# Patient Record
Sex: Male | Born: 1935 | Race: White | Hispanic: No | Marital: Married | Smoking: Former smoker
Health system: Southern US, Community
[De-identification: ages and names within clinical notes are randomized; demographics above are authoritative.]

## PROBLEM LIST (undated history)

## (undated) DIAGNOSIS — M1611 Unilateral primary osteoarthritis, right hip: Secondary | ICD-10-CM

## (undated) DIAGNOSIS — K219 Gastro-esophageal reflux disease without esophagitis: Secondary | ICD-10-CM

## (undated) DIAGNOSIS — K449 Diaphragmatic hernia without obstruction or gangrene: Secondary | ICD-10-CM

## (undated) DIAGNOSIS — R112 Nausea with vomiting, unspecified: Secondary | ICD-10-CM

## (undated) DIAGNOSIS — N3281 Overactive bladder: Secondary | ICD-10-CM

## (undated) DIAGNOSIS — I4891 Unspecified atrial fibrillation: Secondary | ICD-10-CM

## (undated) DIAGNOSIS — K921 Melena: Secondary | ICD-10-CM

## (undated) DIAGNOSIS — T4145XA Adverse effect of unspecified anesthetic, initial encounter: Secondary | ICD-10-CM

## (undated) DIAGNOSIS — M1990 Unspecified osteoarthritis, unspecified site: Secondary | ICD-10-CM

## (undated) DIAGNOSIS — K5790 Diverticulosis of intestine, part unspecified, without perforation or abscess without bleeding: Secondary | ICD-10-CM

## (undated) DIAGNOSIS — T8859XA Other complications of anesthesia, initial encounter: Secondary | ICD-10-CM

## (undated) DIAGNOSIS — K226 Gastro-esophageal laceration-hemorrhage syndrome: Secondary | ICD-10-CM

## (undated) DIAGNOSIS — K222 Esophageal obstruction: Secondary | ICD-10-CM

## (undated) DIAGNOSIS — A5216 Charcot's arthropathy (tabetic): Secondary | ICD-10-CM

## (undated) DIAGNOSIS — E7800 Pure hypercholesterolemia, unspecified: Secondary | ICD-10-CM

## (undated) DIAGNOSIS — Z9889 Other specified postprocedural states: Secondary | ICD-10-CM

## (undated) HISTORY — DX: Esophageal obstruction: K22.2

## (undated) HISTORY — PX: UMBILICAL HERNIA REPAIR: SHX196

## (undated) HISTORY — DX: Gastro-esophageal reflux disease without esophagitis: K21.9

## (undated) HISTORY — DX: Unspecified osteoarthritis, unspecified site: M19.90

## (undated) HISTORY — PX: CHOLECYSTECTOMY: SHX55

## (undated) HISTORY — DX: Gastro-esophageal laceration-hemorrhage syndrome: K22.6

## (undated) HISTORY — DX: Diverticulosis of intestine, part unspecified, without perforation or abscess without bleeding: K57.90

## (undated) HISTORY — DX: Diaphragmatic hernia without obstruction or gangrene: K44.9

## (undated) HISTORY — PX: UPPER GASTROINTESTINAL ENDOSCOPY: SHX188

## (undated) HISTORY — DX: Unspecified atrial fibrillation: I48.91

## (undated) HISTORY — PX: COLONOSCOPY: SHX174

---
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
## 1979-01-05 DIAGNOSIS — K226 Gastro-esophageal laceration-hemorrhage syndrome: Secondary | ICD-10-CM

## 1979-01-05 HISTORY — DX: Gastro-esophageal laceration-hemorrhage syndrome: K22.6

## 1979-10-30 ENCOUNTER — Encounter: Payer: Self-pay | Admitting: Gastroenterology

## 1979-11-02 ENCOUNTER — Encounter: Payer: Self-pay | Admitting: Gastroenterology

## 1988-01-05 HISTORY — PX: HEMORRHOID SURGERY: SHX153

## 1988-01-16 ENCOUNTER — Encounter: Payer: Self-pay | Admitting: Gastroenterology

## 1992-03-27 ENCOUNTER — Encounter: Payer: Self-pay | Admitting: Gastroenterology

## 1992-04-11 ENCOUNTER — Encounter: Payer: Self-pay | Admitting: Gastroenterology

## 1998-08-14 ENCOUNTER — Encounter: Payer: Self-pay | Admitting: Gastroenterology

## 2003-12-23 ENCOUNTER — Ambulatory Visit: Payer: Self-pay | Admitting: Gastroenterology

## 2004-01-09 ENCOUNTER — Ambulatory Visit: Payer: Self-pay | Admitting: Gastroenterology

## 2004-01-09 ENCOUNTER — Encounter (INDEPENDENT_AMBULATORY_CARE_PROVIDER_SITE_OTHER): Payer: Self-pay | Admitting: *Deleted

## 2008-12-18 ENCOUNTER — Encounter (INDEPENDENT_AMBULATORY_CARE_PROVIDER_SITE_OTHER): Payer: Self-pay | Admitting: *Deleted

## 2009-01-29 ENCOUNTER — Encounter (INDEPENDENT_AMBULATORY_CARE_PROVIDER_SITE_OTHER): Payer: Self-pay | Admitting: *Deleted

## 2009-02-21 ENCOUNTER — Encounter (INDEPENDENT_AMBULATORY_CARE_PROVIDER_SITE_OTHER): Payer: Self-pay | Admitting: *Deleted

## 2009-02-24 ENCOUNTER — Ambulatory Visit: Payer: Self-pay | Admitting: Gastroenterology

## 2009-03-11 ENCOUNTER — Ambulatory Visit: Payer: Self-pay | Admitting: Gastroenterology

## 2009-05-28 ENCOUNTER — Encounter: Admission: RE | Admit: 2009-05-28 | Discharge: 2009-05-28 | Payer: Self-pay | Admitting: Endocrinology

## 2010-02-05 NOTE — Letter (Signed)
Summary: [facility] Instructions  [facility]  [address] [place], [place] [address]   Phone: [phone]  Fax: [phone]       Phillip Shelton    January 10, 1934    MRN: [id number]        Procedure Day Dorna Bloom:  Jake Shark  03/11/09     Arrival Time:  7:30AM     Procedure Time:  8:30AM     Location of Procedure:                    _ X_   Endoscopy Center (4th Floor)                        PREPARATION FOR COLONOSCOPY WITH MOVIPREP   Starting 5 days prior to your procedure 03/06/09 do not eat nuts, seeds, popcorn, corn, beans, peas,  salads, or any raw vegetables.  Do not take any fiber supplements (e.g. Metamucil, Citrucel, and Benefiber).  THE DAY BEFORE YOUR PROCEDURE         DATE: 03/10/09  DAY: MONDAY  1.  Drink clear liquids the entire day-NO SOLID FOOD  2.  Do not drink anything colored red or purple.  Avoid juices with pulp.  No orange juice.  3.  Drink at least 64 oz. (8 glasses) of fluid/clear liquids during the day to prevent dehydration and help the prep work efficiently.  CLEAR LIQUIDS INCLUDE: Water Jello Ice Popsicles Tea (sugar ok, no milk/cream) Powdered fruit flavored drinks Coffee (sugar ok, no milk/cream) Gatorade Juice: apple, white grape, white cranberry  Lemonade Clear bullion, consomm, broth Carbonated beverages (any kind) Strained chicken noodle soup Hard Candy                             4.  In the morning, mix first dose of MoviPrep solution:    Empty 1 Pouch A and 1 Pouch B into the disposable container    Add lukewarm drinking water to the top line of the container. Mix to dissolve    Refrigerate (mixed solution should be used within 24 hrs)  5.  Begin drinking the prep at 5:00 p.m. The MoviPrep container is divided by 4 marks.   Every 15 minutes drink the solution down to the next mark (approximately 8 oz) until the full liter is complete.   6.  Follow completed prep with 16 oz of clear liquid of your choice (Nothing  red or purple).  Continue to drink clear liquids until bedtime.  7.  Before going to bed, mix second dose of MoviPrep solution:    Empty 1 Pouch A and 1 Pouch B into the disposable container    Add lukewarm drinking water to the top line of the container. Mix to dissolve    Refrigerate  THE DAY OF YOUR PROCEDURE      DATE: 03/11/09  DAY: TUESDAY  Beginning at 3:30AM (5 hours before procedure):         1. Every 15 minutes, drink the solution down to the next mark (approx 8 oz) until the full liter is complete.  2. Follow completed prep with 16 oz. of clear liquid of your choice.    3. You may drink clear liquids until 6:30AM (2 HOURS BEFORE PROCEDURE).   MEDICATION INSTRUCTIONS  Unless otherwise instructed, you should take regular prescription medications with a small sip of water   as early as possible the morning  of your procedure.      Additional medication instructions: n/a         OTHER INSTRUCTIONS  You will need a responsible adult at least 75 years of age to accompany you and drive you home.   This person must remain in the waiting room during your procedure.  Wear loose fitting clothing that is easily removed.  Leave jewelry and other valuables at home.  However, you may wish to bring a book to read or  an iPod/MP3 player to listen to music as you wait for your procedure to start.  Remove all body piercing jewelry and leave at home.  Total time from sign-in until discharge is approximately 2-3 hours.  You should go home directly after your procedure and rest.  You can resume normal activities the  day after your procedure.  The day of your procedure you should not:   Drive   Make legal decisions   Operate machinery   Drink alcohol   Return to work  You will receive specific instructions about eating, activities and medications before you leave.    The above instructions have been reviewed and explained to me by   Sherren Kerns RN  February 24, 2009 1:03 PM.    I fully understand and can verbalize these instructions _____________________________ Date _________

## 2010-02-05 NOTE — Letter (Signed)
Summary: Previsit letter  [facility]  [address] [place], [place] [address]   Phone: [phone]  Fax: [phone]       01/29/2009 MRN: [id number]  Phillip Shelton [address] [place], [place]  [address]  Dear [name],  Welcome to the Gastroenterology Division at [facility].    You are scheduled to see a nurse for your pre-procedure visit on 02/24/2009 at 1:00PM on the 3rd floor at [facility], [address]  We ask that you try to arrive at our office 15 minutes prior to your appointment time to allow for check-in.  Your nurse visit will consist of discussing your medical and surgical history, your immediate family medical history, and your medications.    Please bring a complete list of all your medications or, if you prefer, bring the medication bottles and we will list them.  We will need to be aware of both prescribed and over the counter drugs.  We will need to know exact dosage information as well.  If you are on blood thinners (Coumadin, Plavix, Aggrenox, Ticlid, etc.) please call our office today/prior to your appointment, as we need to consult with your physician about holding your medication.   Please be prepared to read and sign documents such as consent forms, a financial agreement, and acknowledgement forms.  If necessary, and with your consent, a friend or relative is welcome to sit-in on the nurse visit with you.  Please bring your insurance card so that we may make a copy of it.  If your insurance requires a referral to see a specialist, please bring your referral form from your primary care physician.  No co-pay is required for this nurse visit.     If you cannot keep your appointment, please call [phone] to cancel or reschedule prior to your appointment date.  This allows [place] the opportunity to schedule an appointment for another patient in need of care.    Thank you for choosing [facility] for your medical  needs.  We appreciate the opportunity to care for you.  Please visit [place] at our website  to learn more about our practice.                     Sincerely.                                                                                                                   The Gastroenterology Division

## 2010-02-05 NOTE — Miscellaneous (Signed)
Summary: previsit/rm  Clinical Lists Changes  Medications: Added new medication of MOVIPREP 100 GM  SOLR (PEG-KCL-NACL-NASULF-NA ASC-C) As per prep instructions. - Signed Rx of MOVIPREP 100 GM  SOLR (PEG-KCL-NACL-NASULF-NA ASC-C) As per prep instructions.;  #1 x 0;  Signed;  Entered by: Sherren Kerns RN;  Authorized by: Meryl Dare MD [facility];  Method used: [facility]  [address] [id number] [address] [place], [place]  [address], Ph: [phone] or [phone], Fax: [phone] Observations: Added new observation of ALLERGY REV: Done (02/24/2009 12:37) Added new observation of NKA: T (02/24/2009 12:37)    Prescriptions: MOVIPREP 100 GM  SOLR (PEG-KCL-NACL-NASULF-NA ASC-C) As per prep instructions.  #1 x 0   Entered by:   Sherren Kerns RN   Authorized by:   Meryl Dare MD [facility]   Signed by:   Sherren Kerns RN on 02/24/2009   Method used:   [facility]  [address] [id number]       [address]       [place], [place]  [address]       Ph: [phone] or [phone]       Fax: [phone]   RxID:   [id number]

## 2010-02-05 NOTE — Procedures (Signed)
Summary: Colonoscopy  Patient: Phillip Shelton Note: All result statuses are Final unless otherwise noted.  Tests: (1) Colonoscopy (COL)   COL Colonoscopy           DONE     [facility]     [address]     [place], [place]  [address]           COLONOSCOPY PROCEDURE REPORT           PATIENT:  Phillip Shelton, Phillip Shelton  MR#:  [id number]     BIRTHDATE:  08-27-1934, 74 yrs. old  GENDER:  male           ENDOSCOPIST:  Judie Petit T. Russella Dar, MD, [facility]           PROCEDURE DATE:  03/11/2009     PROCEDURE:  Colonoscopy 132[address]     ASA CLASS:  Class II     INDICATIONS:  1) Elevated Risk Screening  2) family history of     colon cancer `           MEDICATIONS:   Fentanyl 75 mcg IV, Versed 7 mg IV           DESCRIPTION OF PROCEDURE:   After the risks benefits and     alternatives of the procedure were thoroughly explained, informed     consent was obtained.  Digital rectal exam was performed and     revealed no abnormalities.   The LB [id number] endoscope     was introduced through the anus and advanced to the cecum, which     was identified by both the appendix and ileocecal valve, without     limitations.  The quality of the prep was excellent, using     MoviPrep.  The instrument was then slowly withdrawn as the colon     was fully examined.     <<PROCEDUREIMAGES>>           FINDINGS:  Mild diverticulosis was found in the sigmoid colon.  A     normal appearing cecum, ileocecal valve, and appendiceal orifice     were identified. The ascending, hepatic flexure, transverse,     splenic flexure, descending, and rectum appeared unremarkable.     Retroflexed views in the rectum revealed internal hemorrhoids.,     small.  The time to cecum =  3.25  minutes. The scope was then     withdrawn (time =  10  min) from the patient and the procedure     completed.           COMPLICATIONS:  None           ENDOSCOPIC IMPRESSION:     1) Mild diverticulosis     2) Internal hemorrhoids        RECOMMENDATIONS:     1) High fiber diet with liberal fluid intake.     2) Repeat Colonoscopy in 5 years.           Venita Lick. Russella Dar, MD, [facility]           CC: Reather Littler, MD           n.     Rosalie DoctorVenita Lick. Montrelle Eddings at 03/11/2009 09:04 AM           Marden Noble, [phone]  Note: An exclamation mark (!) indicates a result that was not dispersed into the flowsheet. Document Creation Date: 03/11/2009 9:04 AM _______________________________________________________________________  (1) Order result status: Final Collection or  observation date-time: 03/11/2009 08:59 Requested date-time:  Receipt date-time:  Reported date-time:  Referring Physician:   Ordering Physician: Claudette Head [phone]) Specimen Source:  Source: Launa Grill Order Number: [id number] Lab site:   Appended Document: Colonoscopy    Clinical Lists Changes  Observations: Added new observation of COLONNXTDUE: 03/2014 (03/11/2009 12:03)

## 2010-02-10 ENCOUNTER — Telehealth: Payer: Self-pay | Admitting: Gastroenterology

## 2010-02-12 ENCOUNTER — Encounter: Payer: Self-pay | Admitting: Gastroenterology

## 2010-02-12 ENCOUNTER — Ambulatory Visit (INDEPENDENT_AMBULATORY_CARE_PROVIDER_SITE_OTHER): Payer: MEDICARE | Admitting: Gastroenterology

## 2010-02-12 DIAGNOSIS — K921 Melena: Secondary | ICD-10-CM

## 2010-02-12 DIAGNOSIS — K219 Gastro-esophageal reflux disease without esophagitis: Secondary | ICD-10-CM

## 2010-02-12 DIAGNOSIS — K920 Hematemesis: Secondary | ICD-10-CM

## 2010-02-12 HISTORY — DX: Melena: K92.1

## 2010-02-12 HISTORY — DX: Gastro-esophageal reflux disease without esophagitis: K21.9

## 2010-02-16 ENCOUNTER — Other Ambulatory Visit: Payer: Self-pay | Admitting: Gastroenterology

## 2010-02-16 ENCOUNTER — Other Ambulatory Visit (AMBULATORY_SURGERY_CENTER): Payer: MEDICARE | Admitting: Gastroenterology

## 2010-02-16 DIAGNOSIS — K209 Esophagitis, unspecified: Secondary | ICD-10-CM

## 2010-02-16 DIAGNOSIS — K219 Gastro-esophageal reflux disease without esophagitis: Secondary | ICD-10-CM

## 2010-02-16 DIAGNOSIS — K222 Esophageal obstruction: Secondary | ICD-10-CM

## 2010-02-16 DIAGNOSIS — K920 Hematemesis: Secondary | ICD-10-CM

## 2010-02-19 ENCOUNTER — Encounter: Payer: Self-pay | Admitting: Gastroenterology

## 2010-02-19 NOTE — Letter (Signed)
Summary: Phillip Shelton   Imported By: Sherian Rein 02/12/2010 14:01:38  _____________________________________________________________________  External Attachment:    Type:   Image     Comment:   External Document

## 2010-02-19 NOTE — Procedures (Signed)
Summary: Colonoscopy   Colonoscopy  Procedure date:  01/09/2004  Findings:      Results: Hemorrhoids.     Results: Diverticulosis.       Location:  [facility].    Procedures Next Due Date:    Colonoscopy: 01/2009  Colonoscopy  Procedure date:  01/09/2004  Findings:      Results: Hemorrhoids.     Results: Diverticulosis.       Location:  [facility].    Procedures Next Due Date:    Colonoscopy: 01/2009 Patient Name: Aws, Shere MRN:  Procedure Procedures: Colonoscopy CPT: 16109.  Personnel: Endoscopist: Ulyess Mort, MD.  Exam Location: Exam performed in Outpatient Clinic. Outpatient  Patient Consent: Procedure, Alternatives, Risks and Benefits discussed, consent obtained, from patient. Consent was obtained by the RN.  Indications  Increased Risk Screening: For family history of colorectal neoplasia, in   History  Current Medications: Patient is not currently taking Coumadin.  Pre-Exam Physical: Entire physical exam was normal.  Exam Exam: Extent of exam reached: Cecum, extent intended: Cecum.  The cecum was identified by appendiceal orifice and IC valve. Colon retroflexion performed. Images were not taken. ASA Classification: II. Tolerance: good.  Monitoring: Pulse and BP monitoring, Oximetry used. Supplemental O2 given.  Colon Prep Prep results: good.  Sedation Meds: Patient assessed and found to be appropriate for moderate (conscious) sedation. Fentanyl 125 mcg. given IV. Versed 12 given IV.  Findings - DIVERTICULOSIS: Splenic Flexure to Sigmoid Colon. ICD9: Diverticulosis: 562.10. Comments: minnimal.  - HEMORRHOIDS: Internal and External. Size: Grade I. ICD9: Hemorrhoids, Internal and  External: 455.6.   Assessment Abnormal examination, see findings above.  Diagnoses: 562.10: Diverticulosis.  455.6: Hemorrhoids, Internal and  External.   Events  Unplanned Interventions: No intervention was required.    Unplanned Events: There were no complications. Plans Medication Plan: Continue current medications.  Patient Education: Patient given standard instructions for: Diverticulosis. Hemorrhoids. Yearly hemoccult testing recommended. Patient instructed to get routine colonoscopy every 5 years.  Disposition: After procedure patient sent to recovery. After recovery patient sent home.  This report was created from the original endoscopy report, which was reviewed and signed by the above listed endoscopist.    cc: Emiliano Dyer, MD

## 2010-02-19 NOTE — Procedures (Signed)
Summary: [facility]   Imported By: Sherian Rein 02/12/2010 14:16:33  _____________________________________________________________________  External Attachment:    Type:   Image     Comment:   External Document

## 2010-02-19 NOTE — Procedures (Signed)
Summary: [facility]   Imported By: Sherian Rein 02/12/2010 14:15:22  _____________________________________________________________________  External Attachment:    Type:   Image     Comment:   External Document

## 2010-02-19 NOTE — Letter (Signed)
Summary: EGD Instructions  [facility]  [address] [place], [place] [address]   Phone: [phone]  Fax: [phone]       Phillip Shelton    10-30-1934    MRN: [id number]       Procedure Day /Date: Monday February 13th, 2012     Arrival Time:  9:00am     Procedure Time: 10:00am     Location of Procedure:                    _ x _ [facility] (4th Floor)    PREPARATION FOR ENDOSCOPY   On 02/16/10 THE DAY OF THE PROCEDURE:  1.   No solid foods, milk or milk products are allowed after midnight the night before your procedure.  2.   Do not drink anything colored red or purple.  Avoid juices with pulp.  No orange juice.  3.  You may drink clear liquids until 8:00am, which is 2 hours before your procedure.                                                                                                CLEAR LIQUIDS INCLUDE: Water Jello Ice Popsicles Tea (sugar ok, no milk/cream) Powdered fruit flavored drinks Coffee (sugar ok, no milk/cream) Gatorade Juice: apple, white grape, white cranberry  Lemonade Clear bullion, consomm, broth Carbonated beverages (any kind) Strained chicken noodle soup Hard Candy   MEDICATION INSTRUCTIONS  Unless otherwise instructed, you should take regular prescription medications with a small sip of water as early as possible the morning of your procedure.        OTHER INSTRUCTIONS  You will need a responsible adult at least 74 years of age to accompany you and drive you home.   This person must remain in the waiting room during your procedure.  Wear loose fitting clothing that is easily removed.  Leave jewelry and other valuables at home.  However, you may wish to bring a book to read or an iPod/MP3 player to listen to music as you wait for your procedure to start.  Remove all body piercing jewelry and leave at home.  Total time from sign-in until discharge is approximately 2-3 hours.  You should go home  directly after your procedure and rest.  You can resume normal activities the day after your procedure.  The day of your procedure you should not:   Drive   Make legal decisions   Operate machinery   Drink alcohol   Return to work  You will receive specific instructions about eating, activities and medications before you leave.    The above instructions have been reviewed and explained to me by   Marchelle Folks.     I fully understand and can verbalize these instructions _____________________________ Date _________

## 2010-02-19 NOTE — Procedures (Signed)
Summary: Panendoscopy/[place]  Panendoscopy/[place]   Imported By: Sherian Rein 02/12/2010 14:03:47  _____________________________________________________________________  External Attachment:    Type:   Image     Comment:   External Document

## 2010-02-19 NOTE — Progress Notes (Signed)
Summary: Triage  Phone Note Call from Patient Call back at Home Phone [phone]   Caller: Patient Call For: Dr. Russella Dar Reason for Call: Talk to Nurse Summary of Call: Pt needs to speak with nurse about his acid reflux Initial call taken by: [place] Johnson,  February 10, 2010 1:40 PM  Follow-up for Phone Call        patient having refuls.  he will come in adn see Dr Russella Dar on Thursday at 10:15 Follow-up by: Darcey Nora RN, CGRN,  February 10, 2010 4:43 PM

## 2010-02-19 NOTE — Assessment & Plan Note (Signed)
Summary: GERD/hematemesis/sheri   History of Present Illness Visit Type: Follow-up Visit Primary GI MD: Elie Goody MD [facility] Primary Provider: Reather Littler, MD Requesting Provider: n/a Chief Complaint: Patient c/o several years "sour feeling" and burning in throat especially after eating spicy foods. Patient notes 2 recent episodes of blood in saliva but denies actually vomiting blood. History of Present Illness:   This is a 76 year old male who relates worsening problems with nighttime reflux and regurgitation. This has led to vomiting on 2 occasions in the past month. He had a small amount of hematemesis with one episode. Following that, he began taking omeprazole on a daily basis instead of as needed and his symptoms have all improved. He underwent upper endoscopy in 1981, showing a Mallory-Weiss tear, chronic reflux changes, peptic stricture, hiatal hernia.   GI Review of Systems    Reports acid reflux and  heartburn.      Denies abdominal pain, belching, bloating, chest pain, dysphagia with liquids, dysphagia with solids, loss of appetite, nausea, vomiting, vomiting blood, weight loss, and  weight gain.      Reports diverticulosis.     Denies anal fissure, black tarry stools, change in bowel habit, constipation, diarrhea, fecal incontinence, heme positive stool, hemorrhoids, irritable bowel syndrome, jaundice, light color stool, liver problems, rectal bleeding, and  rectal pain. Preventive Screening-Counseling & Management  Alcohol-Tobacco     Smoking Status: quit  Caffeine-Diet-Exercise     Does Patient Exercise: yes   Current Medications (verified): 1)  Omeprazole 20 Mg Cpdr (Omeprazole) .... Take 1 Tablet By Mouth Once A Day (Patient Takes Only As Needed) 2)  Mobic 7.5 Mg Tabs (Meloxicam) .... Take 1 Tablet By Mouth Once Daily  Allergies (verified): No Known Drug Allergies  Past History:  Past Medical History: Diverticulosis Hemorrhoids GERD Mallory-Weiss tear  1981 Arthritis  Past Surgical History: Cholecystectomy Hemorrhoidectomy 1990 Umbilical hernia repair  Family History: FH of Colon Cancer: Grandmother  Social History: Married Alcohol Use - yes-2 beers daily Illicit Drug Use - no Patient is a former smoker. -stopped 30 years ago Patient gets regular exercise. Smoking Status:  quit Does Patient Exercise:  yes  Review of Systems       The pertinent positives and negatives are noted as above and in the HPI. All other ROS were reviewed and were negative.   Vital Signs:  Patient profile:   76 year old male Height:      74 inches Weight:      218 pounds BMI:     28.09 BSA:     2.26 Pulse rate:   72 / minute Pulse rhythm:   regular BP sitting:   132 / 80  (left arm)  Vitals Entered By: Lamona Curl CMA (AAMA) (February 12, 2010 10:18 AM)  Physical Exam  General:  Well developed, well nourished, no acute distress. Head:  Normocephalic and atraumatic. Eyes:  PERRLA, no icterus. Mouth:  No deformity or lesions, dentition normal. Lungs:  Clear throughout to auscultation. Heart:  Regular rate and rhythm; no murmurs, rubs,  or bruits. Abdomen:  Soft, nontender and nondistended. No masses, hepatosplenomegaly or hernias noted. Normal bowel sounds. Psych:  Alert and cooperative. Normal mood and affect.  Impression & Recommendations:  Problem # 1:  GERD (ICD-530.81) Worsening reflux symptoms with significant nocturnal symptoms. Regular Mobic usage. Small amount of hematemesis. Rule out esophagitis, ulcer disease an upper GI tract neoplasms. The risks, benefits and alternatives to endoscopy with possible biopsy and possible dilation were discussed  with the patient and they consent to proceed. The procedure will be scheduled electively. Orders: EGD (EGD)  Problem # 2:  HEMATEMESIS (ICD-578.0) See above.  Problem # 3:  FAMILY HX COLON CANCER (ICD-V16.0) Screening colonoscopy recommended March 2016.  Patient  Instructions: 1)  Upper Endoscopy brochure given.  2)  Omeprazole has been sent to your pharmacy for you to take one tablet by mouth once daily. 3)  Avoid foods high in acid content ( tomatoes, citrus juices, spicy foods) . Avoid eating within 3 to 4 hours of lying down or before exercising. Do not over eat; try smaller more frequent meals. Elevate head of bed four inches when sleeping.  4)  Copy sent to : Reather Littler, MD 5)  The medication list was reviewed and reconciled.  All changed / newly prescribed medications were explained.  A complete medication list was provided to the patient / caregiver.  Prescriptions: OMEPRAZOLE 20 MG CPDR (OMEPRAZOLE) Take 1 tablet by mouth once a day  #30 x 11   Entered by:   Christie Nottingham CMA (AAMA)   Authorized by:   Meryl Dare MD [facility]   Signed by:   Christie Nottingham CMA (AAMA) on 02/12/2010   Method used:   [facility]  [address] [id number]       [address]       [place], [place]  [address]       Ph: [phone] or [phone]       Fax: [phone]   RxID:   [id number]

## 2010-02-19 NOTE — Op Note (Signed)
Summary: Anoscopy & Hemorrhoidectomy/[place]  Anoscopy & Hemorrhoidectomy/[place]   Imported By: Sherian Rein 02/12/2010 14:07:52  _____________________________________________________________________  External Attachment:    Type:   Image     Comment:   External Document

## 2010-02-19 NOTE — Letter (Signed)
Summary: Phillip Shelton   Imported By: Sherian Rein 02/12/2010 14:14:10  _____________________________________________________________________  External Attachment:    Type:   Image     Comment:   External Document

## 2010-02-25 NOTE — Letter (Signed)
Summary: Patient [facility] Biopsy Results  [facility]  [address] [place], [place] [address]   Phone: [phone]  Fax: [phone]        February 19, 2010 MRN: [id number]    MYCAH FORMICA [address] [place], [place]  [address]    Dear Mr. Lauricella,  I am pleased to inform you that the biopsies taken during your recent endoscopic examination did not show any evidence of cancer upon pathologic examination. The biopsies showed moderate chronic inflammation.  Continue with the treatment plan as outlined on the day of your      exam.  Please call us if you are having persistent problems or have questions about your condition that have not been fully answered at this time.  Sincerely,  Meryl Dare MD [facility]  This letter has been electronically signed by your physician.  Appended Document: Patient Notice-Endo Biopsy Results letter mailed

## 2010-02-25 NOTE — Procedures (Signed)
Summary: Upper Endoscopy  Patient: Phillip Shelton Note: All result statuses are Final unless otherwise noted.  Tests: (1) Upper Endoscopy (EGD)   EGD Upper Endoscopy       DONE     [facility]     [address]     [place], [place]  [address]           ENDOSCOPY PROCEDURE REPORT     PATIENT:  Rodrigo, Mcgranahan  MR#:  [id number]     BIRTHDATE:  1934/06/07, 75 yrs. old  GENDER:  male     ENDOSCOPIST:  Judie Petit T. Russella Dar, MD, [facility]           PROCEDURE DATE:  02/16/2010     PROCEDURE:  EGD with biopsy, [address]     ASA CLASS:  Class II     INDICATIONS:  hematemesis, GERD     MEDICATIONS:  Fentanyl 50 mcg IV, Versed 5 mg IV     TOPICAL ANESTHETIC:  Exactacain Spray     DESCRIPTION OF PROCEDURE:   After the risks benefits and     alternatives of the procedure were thoroughly explained, informed     consent was obtained.  The LB GIF-H180 [id number] endoscope was     introduced through the mouth and advanced to the second portion of     the duodenum, without limitations.  The instrument was slowly     withdrawn as the mucosa was fully examined.     <<PROCEDUREIMAGES>>     A stricture was found at the gastroesophageal junction. It was     circumferential and benign appearing. Multiple biopsies were     obtained and sent to pathology.  The stomach appeared normal. The     duodenum appeared normal.  Retroflexed views revealed a hiatal     hernia, small.  The scope was then withdrawn from the patient and     the procedure completed.           COMPLICATIONS:  None           ENDOSCOPIC IMPRESSION:     1) Stricture at EGJ     2) Small hiatal hernia           RECOMMENDATIONS:     1) Anti-reflux regimen     2) Await pathology results     3) continue PPI           Malcolm T. Russella Dar, MD, [facility]           CC:  Reather Littler, MD           n.     Rosalie DoctorVenita Lick. Stark at 02/16/2010 09:49 AM           Marden Noble, [id number]  Note: An exclamation mark (!) indicates a result that was  not dispersed into the flowsheet. Document Creation Date: 02/16/2010 9:50 AM _______________________________________________________________________  (1) Order result status: Final Collection or observation date-time: 02/16/2010 09:44 Requested date-time:  Receipt date-time:  Reported date-time:  Referring Physician:   Ordering Physician: Claudette Head [phone]) Specimen Source:  Source: Launa Grill Order Number: [id number] Lab site:

## 2010-08-26 ENCOUNTER — Ambulatory Visit (INDEPENDENT_AMBULATORY_CARE_PROVIDER_SITE_OTHER): Payer: Medicare Other | Admitting: Gastroenterology

## 2010-08-26 ENCOUNTER — Encounter: Payer: Self-pay | Admitting: Gastroenterology

## 2010-08-26 VITALS — BP 140/76 | HR 60 | Ht 73.0 in | Wt 203.0 lb

## 2010-08-26 DIAGNOSIS — K921 Melena: Secondary | ICD-10-CM

## 2010-08-26 DIAGNOSIS — K649 Unspecified hemorrhoids: Secondary | ICD-10-CM

## 2010-08-26 DIAGNOSIS — K219 Gastro-esophageal reflux disease without esophagitis: Secondary | ICD-10-CM

## 2010-08-26 NOTE — Patient Instructions (Signed)
Rectal care and hemorrhoid information given.  cc:  Reather Littler, MD

## 2010-08-26 NOTE — Progress Notes (Signed)
History of Present Illness: This is a 76 year old male who has had intermittent small volume hematochezia described as small amounts of blood on the tissue paper. He uses Preparation H suppositories for a few days and this alleviates his symptoms. The symptoms occur about every 3-6 months. He underwent colonoscopy in March 2011 showing only internal hemorrhoids.  Reflux is well controlled on daily omeprazole.  Denies weight loss, abdominal pain, constipation, diarrhea, change in stool caliber, melena, nausea, vomiting, dysphagia, reflux symptoms, chest pain.  Current Medications, Allergies, Past Medical History, Past Surgical History, Family History and Social History were reviewed in [employer] record.  Physical Exam: General: Well developed , well nourished, no acute distress Head: Normocephalic and atraumatic Eyes:  sclerae anicteric, EOMI Ears: Normal auditory acuity Mouth: No deformity or lesions Lungs: Clear throughout to auscultation Heart: Regular rate and rhythm; no murmurs, rubs or bruits Abdomen: Soft, non tender and non distended. No masses, hepatosplenomegaly or hernias noted. Normal Bowel sounds Rectal: No lesions Hemoccult negative brown stool in the vault Musculoskeletal: Symmetrical with no gross deformities  Pulses:  Normal pulses noted Extremities: No clubbing, cyanosis, edema or deformities noted Neurological: Alert oriented x 4, grossly nonfocal Psychological:  Alert and cooperative. Normal mood and affect  Assessment and Recommendations:  1. Small volume hematochezia secondary to internal hemorrhoids. Standard rectal care instructions. Preparation H suppositories daily as needed. If his symptoms worsen in severity or frequency consider further evaluation and hemorrhoidal management.  2. Family history of colon cancer. Screening colonoscopy recommended March 2016.  3. GERD with a history of a peptic stricture. Maintain standard and flux  measures and omeprazole 20 mg daily long-term.

## 2011-01-29 ENCOUNTER — Encounter (HOSPITAL_BASED_OUTPATIENT_CLINIC_OR_DEPARTMENT_OTHER): Payer: Self-pay | Admitting: *Deleted

## 2011-01-29 NOTE — Progress Notes (Signed)
Had cxr-ekg at dr Coralie Common call No ht or resp problems No labs needed

## 2011-02-02 ENCOUNTER — Other Ambulatory Visit: Payer: Self-pay | Admitting: Physician Assistant

## 2011-02-03 ENCOUNTER — Encounter (HOSPITAL_BASED_OUTPATIENT_CLINIC_OR_DEPARTMENT_OTHER): Payer: Self-pay | Admitting: Certified Registered Nurse Anesthetist

## 2011-02-03 ENCOUNTER — Encounter (HOSPITAL_BASED_OUTPATIENT_CLINIC_OR_DEPARTMENT_OTHER): Payer: Self-pay | Admitting: *Deleted

## 2011-02-03 ENCOUNTER — Encounter (HOSPITAL_BASED_OUTPATIENT_CLINIC_OR_DEPARTMENT_OTHER)
Admission: RE | Disposition: A | Discharge status: Home / Self Care | Payer: Self-pay | Source: Ambulatory Visit | Attending: Orthopedic Surgery

## 2011-02-03 ENCOUNTER — Ambulatory Visit (HOSPITAL_BASED_OUTPATIENT_CLINIC_OR_DEPARTMENT_OTHER): Payer: Medicare Other | Admitting: Certified Registered Nurse Anesthetist

## 2011-02-03 ENCOUNTER — Ambulatory Visit (HOSPITAL_BASED_OUTPATIENT_CLINIC_OR_DEPARTMENT_OTHER)
Admission: RE | Admit: 2011-02-03 | Discharge: 2011-02-03 | Disposition: A | Discharge status: Home / Self Care | Payer: Medicare Other | Source: Ambulatory Visit | Attending: Orthopedic Surgery | Admitting: Orthopedic Surgery

## 2011-02-03 DIAGNOSIS — M6240 Contracture of muscle, unspecified site: Secondary | ICD-10-CM | POA: Insufficient documentation

## 2011-02-03 DIAGNOSIS — M79671 Pain in right foot: Secondary | ICD-10-CM

## 2011-02-03 DIAGNOSIS — G609 Hereditary and idiopathic neuropathy, unspecified: Secondary | ICD-10-CM | POA: Insufficient documentation

## 2011-02-03 DIAGNOSIS — A5211 Tabes dorsalis: Secondary | ICD-10-CM | POA: Insufficient documentation

## 2011-02-03 HISTORY — PX: GASTROCNEMIUS RECESSION: SHX863

## 2011-02-03 SURGERY — RECESSION, MUSCLE, GASTROCNEMIUS
Anesthesia: General | Site: Leg Lower | Laterality: Right | Wound class: Clean

## 2011-02-03 MED ORDER — DEXAMETHASONE SODIUM PHOSPHATE 10 MG/ML IJ SOLN
INTRAMUSCULAR | Status: DC | PRN
  Administered 2011-02-03: 10 mg via INTRAVENOUS

## 2011-02-03 MED ORDER — FENTANYL CITRATE 0.05 MG/ML IJ SOLN
INTRAMUSCULAR | Status: DC | PRN
  Administered 2011-02-03: 25 ug via INTRAVENOUS

## 2011-02-03 MED ORDER — CHLORHEXIDINE GLUCONATE 4 % EX LIQD: 60.0000 mL | Freq: Once | CUTANEOUS | Status: DC

## 2011-02-03 MED ORDER — BUPIVACAINE HCL (PF) 0.5 % IJ SOLN
INTRAMUSCULAR | Status: DC | PRN
  Administered 2011-02-03: 7 mL

## 2011-02-03 MED ORDER — ONDANSETRON HCL 4 MG PO TABS: 4.0000 mg | ORAL_TABLET | Freq: Four times a day (QID) | ORAL | Status: DC | PRN

## 2011-02-03 MED ORDER — VITAMIN C 500 MG PO TABS: 500.0000 mg | ORAL_TABLET | Freq: Every day | ORAL | Status: AC

## 2011-02-03 MED ORDER — METOCLOPRAMIDE HCL 5 MG PO TABS: 5.0000 mg | ORAL_TABLET | Freq: Three times a day (TID) | ORAL | Status: DC | PRN

## 2011-02-03 MED ORDER — CEFAZOLIN SODIUM 1-5 GM-% IV SOLN
1.0000 g | INTRAVENOUS | Status: DC
  Administered 2011-02-03: 2 g via INTRAVENOUS

## 2011-02-03 MED ORDER — 0.9 % SODIUM CHLORIDE (POUR BTL) OPTIME
TOPICAL | Status: DC | PRN
  Administered 2011-02-03: 100 mL

## 2011-02-03 MED ORDER — MIDAZOLAM HCL 2 MG/2ML IJ SOLN
1.0000 mg | INTRAMUSCULAR | Status: DC | PRN
  Administered 2011-02-03: 2 mg via INTRAVENOUS

## 2011-02-03 MED ORDER — BUPIVACAINE-EPINEPHRINE PF 0.5-1:200000 % IJ SOLN
INTRAMUSCULAR | Status: DC | PRN
  Administered 2011-02-03: 30 mL

## 2011-02-03 MED ORDER — ONDANSETRON HCL 4 MG/2ML IJ SOLN
INTRAMUSCULAR | Status: DC | PRN
  Administered 2011-02-03: 4 mg via INTRAVENOUS

## 2011-02-03 MED ORDER — ONDANSETRON HCL 4 MG/2ML IJ SOLN: 4.0000 mg | Freq: Four times a day (QID) | INTRAMUSCULAR | Status: DC | PRN

## 2011-02-03 MED ORDER — FENTANYL CITRATE 0.05 MG/ML IJ SOLN
50.0000 ug | INTRAMUSCULAR | Status: DC | PRN
  Administered 2011-02-03: 100 ug via INTRAVENOUS

## 2011-02-03 MED ORDER — SODIUM CHLORIDE 0.9 % IV SOLN: INTRAVENOUS | Status: DC

## 2011-02-03 MED ORDER — LACTATED RINGERS IV SOLN
INTRAVENOUS | Status: DC
  Administered 2011-02-03 (×2): via INTRAVENOUS

## 2011-02-03 MED ORDER — CEFAZOLIN SODIUM-DEXTROSE 2-3 GM-% IV SOLR: 2.0000 g | INTRAVENOUS | Status: DC

## 2011-02-03 MED ORDER — PROPOFOL 10 MG/ML IV EMUL
INTRAVENOUS | Status: DC | PRN
  Administered 2011-02-03: 200 mg via INTRAVENOUS

## 2011-02-03 MED ORDER — METHOCARBAMOL 500 MG PO TABS: 500.0000 mg | ORAL_TABLET | Freq: Three times a day (TID) | ORAL | Status: AC

## 2011-02-03 MED ORDER — HYDROMORPHONE HCL PF 1 MG/ML IJ SOLN: 0.2500 mg | INTRAMUSCULAR | Status: DC | PRN

## 2011-02-03 MED ORDER — METOCLOPRAMIDE HCL 5 MG/ML IJ SOLN: 5.0000 mg | Freq: Three times a day (TID) | INTRAMUSCULAR | Status: DC | PRN

## 2011-02-03 MED ORDER — LIDOCAINE HCL (CARDIAC) 20 MG/ML IV SOLN
INTRAVENOUS | Status: DC | PRN
  Administered 2011-02-03: 60 mg via INTRAVENOUS

## 2011-02-03 MED ORDER — PROMETHAZINE HCL 25 MG/ML IJ SOLN: 6.2500 mg | INTRAMUSCULAR | Status: DC | PRN

## 2011-02-03 SURGICAL SUPPLY — 60 items
APL SKNCLS STERI-STRIP NONHPOA (GAUZE/BANDAGES/DRESSINGS)
BANDAGE ACE 4 STERILE (GAUZE/BANDAGES/DRESSINGS) ×1 IMPLANT
BANDAGE ELASTIC 4 VELCRO ST LF (GAUZE/BANDAGES/DRESSINGS) ×3 IMPLANT
BANDAGE ELASTIC 6 VELCRO ST LF (GAUZE/BANDAGES/DRESSINGS) IMPLANT
BENZOIN TINCTURE PRP APPL 2/3 (GAUZE/BANDAGES/DRESSINGS) ×1 IMPLANT
BLADE SURG 15 STRL LF DISP TIS (BLADE) ×2 IMPLANT
BLADE SURG 15 STRL SS (BLADE) ×4
BRUSH SCRUB EZ PLAIN DRY (MISCELLANEOUS) ×2 IMPLANT
CAP PIN PROTECTOR ORTHO WHT (CAP) IMPLANT
CLOSURE STERI STRIP 1/2 X4 (GAUZE/BANDAGES/DRESSINGS) ×1 IMPLANT
CLOTH BEACON ORANGE TIMEOUT ST (SAFETY) ×2 IMPLANT
COVER TABLE BACK 60X90 (DRAPES) ×2 IMPLANT
CUFF TOURNIQUET SINGLE 34IN LL (TOURNIQUET CUFF) ×2 IMPLANT
DRAPE EXTREMITY T 121X128X90 (DRAPE) ×2 IMPLANT
DRAPE SURG 17X23 STRL (DRAPES) ×2 IMPLANT
DURA STEPPER LG (CAST SUPPLIES) IMPLANT
DURA STEPPER MED (CAST SUPPLIES) IMPLANT
DURA STEPPER SML (CAST SUPPLIES) IMPLANT
ELECT REM PT RETURN 9FT ADLT (ELECTROSURGICAL)
ELECTRODE REM PT RTRN 9FT ADLT (ELECTROSURGICAL) IMPLANT
GAUZE SPONGE 4X4 16PLY XRAY LF (GAUZE/BANDAGES/DRESSINGS) IMPLANT
GLOVE BIO SURGEON STRL SZ 6.5 (GLOVE) ×1 IMPLANT
GLOVE BIO SURGEON STRL SZ8 (GLOVE) ×2 IMPLANT
GLOVE BIOGEL PI IND STRL 8 (GLOVE) ×2 IMPLANT
GLOVE BIOGEL PI INDICATOR 8 (GLOVE) ×2
GLOVE SKINSENSE NS SZ7.0 (GLOVE) ×1
GLOVE SKINSENSE STRL SZ7.0 (GLOVE) IMPLANT
GLOVE SURG SS PI 8.0 STRL IVOR (GLOVE) ×2 IMPLANT
GOWN BRE IMP PREV XXLGXLNG (GOWN DISPOSABLE) ×2 IMPLANT
GOWN PREVENTION PLUS XLARGE (GOWN DISPOSABLE) ×2 IMPLANT
KWIRE 4.0 X .045IN (WIRE) IMPLANT
NEEDLE HYPO 22GX1.5 SAFETY (NEEDLE) IMPLANT
NS IRRIG 1000ML POUR BTL (IV SOLUTION) ×2 IMPLANT
PACK BASIN DAY SURGERY FS (CUSTOM PROCEDURE TRAY) ×2 IMPLANT
PAD CAST 4YDX4 CTTN HI CHSV (CAST SUPPLIES) ×1 IMPLANT
PADDING CAST ABS 4INX4YD NS (CAST SUPPLIES)
PADDING CAST ABS COTTON 4X4 ST (CAST SUPPLIES) IMPLANT
PADDING CAST COTTON 4X4 STRL (CAST SUPPLIES) ×2
PADDING WEBRIL 4 STERILE (GAUZE/BANDAGES/DRESSINGS) ×1 IMPLANT
PENCIL BUTTON HOLSTER BLD 10FT (ELECTRODE) ×1 IMPLANT
SHEET MEDIUM DRAPE 40X70 STRL (DRAPES) ×4 IMPLANT
SPONGE GAUZE 4X4 12PLY (GAUZE/BANDAGES/DRESSINGS) ×2 IMPLANT
STOCKINETTE 6  STRL (DRAPES) ×1
STOCKINETTE 6 STRL (DRAPES) ×1 IMPLANT
STRIP CLOSURE SKIN 1/2X4 (GAUZE/BANDAGES/DRESSINGS) ×1 IMPLANT
SUCTION FRAZIER TIP 10 FR DISP (SUCTIONS) ×1 IMPLANT
SUT ETHILON 3 0 PS 1 (SUTURE) IMPLANT
SUT ETHILON 4 0 PS 2 18 (SUTURE) IMPLANT
SUT MON AB 4-0 PC3 18 (SUTURE) ×1 IMPLANT
SUT VIC AB 3-0 FS2 27 (SUTURE) ×1 IMPLANT
SUT VIC AB 3-0 PS1 18 (SUTURE)
SUT VIC AB 3-0 PS1 18XBRD (SUTURE) IMPLANT
SYR 20CC LL (SYRINGE) IMPLANT
SYR BULB 3OZ (MISCELLANEOUS) ×2 IMPLANT
SYR CONTROL 10ML LL (SYRINGE) IMPLANT
TOWEL OR 17X24 6PK STRL BLUE (TOWEL DISPOSABLE) ×2 IMPLANT
TOWEL OR NON WOVEN STRL DISP B (DISPOSABLE) ×2 IMPLANT
TUBE CONNECTING 20X1/4 (TUBING) ×1 IMPLANT
UNDERPAD 30X30 INCONTINENT (UNDERPADS AND DIAPERS) ×2 IMPLANT
WATER STERILE IRR 1000ML POUR (IV SOLUTION) ×3 IMPLANT

## 2011-02-03 NOTE — Anesthesia Procedure Notes (Addendum)
Anesthesia Regional Block:  Popliteal block  Pre-Anesthetic Checklist: ,, timeout performed, Correct Patient, Correct Site, Correct Laterality, Correct Procedure, Correct Position, site marked, Risks and benefits discussed,  Surgical consent,  Pre-op evaluation,  At surgeon's request and post-op pain management  Laterality: Right  Prep: chloraprep       Needles:  Injection technique: Single-shot  Needle Type: Stimulator Needle - 80      Needle Gauge: 22 and 22 G    Additional Needles:  Procedures: nerve stimulator Popliteal block  Nerve Stimulator or Paresthesia:  Response: toe dorsiflexion, 0.45 mA, 0.1 ms,   Additional Responses:   Narrative:  Start time: 02/03/2011 9:28 AM End time: 02/03/2011 9:40 AM Injection made incrementally with aspirations every 5 mL.  Performed by: Personally  Anesthesiologist: Sandford Craze, MD  Additional Notes: Pt identified in Holding room.  Monitors applied. Working IV access confirmed. Sterile prep.  #22ga PNS to toe twitch at 0.63mA threshold.  30cc 0.5% Bupivacaine with 1:200k epi injected incrementally after negative test dose.  Additional 7cc 0.5% Bupivacaine injected subq at prox, ant-med tibia for saphenous supplementation. Patient asymptomatic, VSS, no heme aspirated, tolerated well.   Sandford Craze, MD   Procedure Name: LMA Insertion Date/Time: 02/03/2011 9:52 AM Performed by: Cari Burgo D Pre-anesthesia Checklist: Patient identified, Emergency Drugs available, Suction available and Patient being monitored Patient Re-evaluated:Patient Re-evaluated prior to inductionOxygen Delivery Method: Circle System Utilized Preoxygenation: Pre-oxygenation with 100% oxygen Intubation Type: IV induction Ventilation: Mask ventilation without difficulty LMA: LMA inserted LMA Size: 5.0 Number of attempts: 1 Placement Confirmation: positive ETCO2 Tube secured with: Tape Dental Injury: Teeth and Oropharynx as per pre-operative assessment

## 2011-02-03 NOTE — Brief Op Note (Signed)
02/03/2011  10:15 AM  PATIENT:  Phillip Shelton  77 y.o. male  PRE-OPERATIVE DIAGNOSIS:  right tight gastroc  POST-OPERATIVE DIAGNOSIS:  right tight gastroc  PROCEDURE:  Procedure(s): GASTROCNEMIUS SLIDE  SURGEON:  Surgeon(s): Sherri Rad, MD  PHYSICIAN ASSISTANT: Rexene Edison, PAC   ASSISTANTS:  Above  ANESTHESIA:   general  EBL:   Minimal  BLOOD ADMINISTERED:none  DRAINS: none   LOCAL MEDICATIONS USED:  NONE  SPECIMEN:  No Specimen  DISPOSITION OF SPECIMEN:  N/A  COUNTS:  YES  TOURNIQUET:  * No tourniquets in log *  DICTATION: .Other Dictation: Dictation Number [id number]  PLAN OF CARE: Discharge to home after PACU  PATIENT DISPOSITION:  PACU - hemodynamically stable.   Delay start of Pharmacological VTE agent (>24hrs) due to surgical blood loss or risk of bleeding:  {YES/NO/NOT APPLICABLE:20182

## 2011-02-03 NOTE — Op Note (Signed)
NAME:  Phillip Shelton, GRIPP                    ACCOUNT NO.:  MEDICAL RECORD NO.:  [id number]  LOCATION:                                 FACILITY:  PHYSICIAN:  Leonides Grills, M.D.          DATE OF BIRTH:  DATE OF PROCEDURE:  02/03/2011 DATE OF DISCHARGE:                              OPERATIVE REPORT   PREOPERATIVE DIAGNOSIS:  Right tight gastroc.  POSTOPERATIVE DIAGNOSIS:  Right tight gastroc.  OPERATION:  Right gastroc slide.  ANESTHESIA:  General.  SURGEON:  Leonides Grills, M.D.  ASSISTANT:  Richardean Canal, P.A.  ESTIMATED BLOOD LOSS:  Minimal.  TOURNIQUET:  None.  COMPLICATIONS:  None.  DISPOSITION:  Stable to PR.  First assistant was used throughout the case for retraction, aid in visualization, completion of procedure, closure, and dressing application.  INDICATIONS:  This is a 77 year old gentleman who has had Charcot arthropathy of his feet due to severe peripheral neuropathy.  He has impending ulcer with rocker bottom deformity to his foot with a tight gastroc.  He was consented to the above procedure.  All risks, infection, vessel injury, wound healing problems, persistent pain, worse pain, prolonged recovery, still a chance of exacerbation of the Charcot arthropathy, and development of an ulcer, and even amputation down the road were all explained.  Questions were encouraged and answered.  DESCRIPTION OF OPERATION:  The patient was brought to the operating room and placed in supine position.  After adequate general anesthesia was administered as well as Ancef 1 g IV piggyback, the right lower extremity was then prepped and draped in sterile manner and no tourniquet was used.  A longitudinal incision over the medial aspect of the gastrocnemius muscle tendinous junction was then made.  Dissection was carried down through skin.  Hemostasis was obtained.  Fascia was opened in line with the incision.  Conjoined region was then developed between the gastroc and soleus.   Soft tissue was then elevated off posterior aspect of gastrocnemius.  Sural nerve was identified and protected posteriorly throughout the case especially while the gastrocnemius was then released with a curved Mayo scissors.  This had excellent release tight gastroc. The area was copiously irrigated with saline. Subcu was closed 0 Vicryl, skin was closed with 4-0 Monocryl subcuticular stitch.  Steri-Strips were applied.  Sterile dressing was applied.  CAM walker boot was applied.  Total contact Bledsoe boot was applied.  The patient was stable to PR.     Leonides Grills, M.D.     PB/MEDQ  D:  02/03/2011  T:  02/03/2011  Job:  [id number]

## 2011-02-03 NOTE — Anesthesia Postprocedure Evaluation (Signed)
  Anesthesia Post-op Note  Patient: Phillip Shelton  Procedure(s) Performed:  GASTROCNEMIUS SLIDE  Patient Location: PACU  Anesthesia Type: GA combined with regional for post-op pain  Level of Consciousness: awake, alert  and oriented  Airway and Oxygen Therapy: Patient Spontanous Breathing  Post-op Pain: none  Post-op Assessment: Post-op Vital signs reviewed, Patient's Cardiovascular Status Stable, Respiratory Function Stable, Patent Airway, No signs of Nausea or vomiting and Pain level controlled  Post-op Vital Signs: Reviewed and stable  Complications: No apparent anesthesia complications

## 2011-02-03 NOTE — Progress Notes (Signed)
Assisted Dr. Jackson with right, popliteal block. Side rails up, monitors on throughout procedure. See vital signs in flow sheet. Tolerated Procedure well. 

## 2011-02-03 NOTE — H&P (Signed)
  H&P documentation: Placed to be scanned history and physical exam in chart.  -History and Physical Reviewed  -Patient has been re-examined  -No change in the plan of care  Sydell Prowell A  

## 2011-02-03 NOTE — Anesthesia Preprocedure Evaluation (Signed)
Anesthesia Evaluation  Patient identified by MRN, date of birth, ID band Patient awake    Reviewed: Allergy & Precautions, H&P , NPO status , Patient's Chart, lab work & pertinent test results  History of Anesthesia Complications Negative for: history of anesthetic complications  Airway Mallampati: I TM Distance: >3 FB Neck ROM: Full    Dental No notable dental hx. (+) Teeth Intact and Dental Advisory Given   Pulmonary former smoker clear to auscultation  Pulmonary exam normal       Cardiovascular neg cardio ROS Regular Normal    Neuro/Psych Negative Neurological ROS     GI/Hepatic Neg liver ROS, GERD-  Medicated and Controlled,  Endo/Other  Negative Endocrine ROS  Renal/GU negative Renal ROS     Musculoskeletal  (+) Arthritis -,   Abdominal Normal abdominal exam  (+)   Peds  Hematology negative hematology ROS (+)   Anesthesia Other Findings   Reproductive/Obstetrics                           Anesthesia Physical Anesthesia Plan  ASA: II  Anesthesia Plan: General   Post-op Pain Management:    Induction: Intravenous  Airway Management Planned: LMA  Additional Equipment:   Intra-op Plan:   Post-operative Plan:   Informed Consent: I have reviewed the patients History and Physical, chart, labs and discussed the procedure including the risks, benefits and alternatives for the proposed anesthesia with the patient or authorized representative who has indicated his/her understanding and acceptance.   Dental advisory given  Plan Discussed with: CRNA and Surgeon  Anesthesia Plan Comments: (Plan routine monitors, GA- LMA OK, popliteal block for post op analgesia)        Anesthesia Quick Evaluation

## 2011-02-03 NOTE — Transfer of Care (Signed)
Immediate Anesthesia Transfer of Care Note  Patient: Phillip Shelton  Procedure(s) Performed:  GASTROCNEMIUS SLIDE  Patient Location: PACU  Anesthesia Type: GA combined with regional for post-op pain  Level of Consciousness: sedated  Airway & Oxygen Therapy: Patient Spontanous Breathing and Patient connected to face mask oxygen  Post-op Assessment: Report given to PACU RN and Post -op Vital signs reviewed and stable  Post vital signs: Reviewed and stable  Complications: No apparent anesthesia complications

## 2011-02-04 ENCOUNTER — Encounter (HOSPITAL_BASED_OUTPATIENT_CLINIC_OR_DEPARTMENT_OTHER): Payer: Self-pay | Admitting: Orthopedic Surgery

## 2011-02-16 ENCOUNTER — Other Ambulatory Visit: Payer: Self-pay | Admitting: Gastroenterology

## 2011-09-13 ENCOUNTER — Other Ambulatory Visit: Payer: Self-pay | Admitting: Dermatology

## 2012-05-07 ENCOUNTER — Other Ambulatory Visit: Payer: Self-pay | Admitting: Gastroenterology

## 2012-07-14 ENCOUNTER — Other Ambulatory Visit: Payer: Self-pay | Admitting: *Deleted

## 2012-07-14 MED ORDER — TRAMADOL HCL 50 MG PO TABS: 50.0000 mg | ORAL_TABLET | Freq: Four times a day (QID) | ORAL | Status: DC | PRN

## 2012-07-27 ENCOUNTER — Other Ambulatory Visit: Payer: Self-pay | Admitting: Dermatology

## 2012-10-03 ENCOUNTER — Other Ambulatory Visit: Payer: Self-pay | Admitting: *Deleted

## 2012-10-03 MED ORDER — MUPIROCIN 2 % EX OINT: TOPICAL_OINTMENT | Freq: Three times a day (TID) | CUTANEOUS | Status: DC

## 2012-10-13 ENCOUNTER — Ambulatory Visit (INDEPENDENT_AMBULATORY_CARE_PROVIDER_SITE_OTHER): Payer: Medicare Other

## 2012-10-13 DIAGNOSIS — Z23 Encounter for immunization: Secondary | ICD-10-CM

## 2012-10-23 ENCOUNTER — Other Ambulatory Visit: Payer: Self-pay | Admitting: *Deleted

## 2012-10-23 MED ORDER — MELOXICAM 7.5 MG PO TABS: 7.5000 mg | ORAL_TABLET | Freq: Every day | ORAL | Status: DC

## 2012-11-09 ENCOUNTER — Other Ambulatory Visit: Payer: Self-pay | Admitting: *Deleted

## 2012-11-09 ENCOUNTER — Telehealth: Payer: Self-pay | Admitting: *Deleted

## 2012-11-09 MED ORDER — TRAMADOL HCL 50 MG PO TABS: 50.0000 mg | ORAL_TABLET | Freq: Four times a day (QID) | ORAL | Status: DC | PRN

## 2012-11-10 NOTE — Telephone Encounter (Signed)
rx sent

## 2012-12-22 ENCOUNTER — Other Ambulatory Visit (INDEPENDENT_AMBULATORY_CARE_PROVIDER_SITE_OTHER): Payer: Medicare Other

## 2012-12-22 ENCOUNTER — Other Ambulatory Visit: Payer: Self-pay | Admitting: *Deleted

## 2012-12-22 DIAGNOSIS — Z0000 Encounter for general adult medical examination without abnormal findings: Secondary | ICD-10-CM

## 2012-12-22 DIAGNOSIS — E7800 Pure hypercholesterolemia, unspecified: Secondary | ICD-10-CM

## 2012-12-22 LAB — COMPREHENSIVE METABOLIC PANEL
ALT: 14 U/L (ref 0–53)
Albumin: 4.2 g/dL (ref 3.5–5.2)
CO2: 26 mEq/L (ref 19–32)
GFR: 86.09 mL/min (ref >60.00)
Glucose, Bld: 96 mg/dL (ref 70–99)
Potassium: 4.4 mEq/L (ref 3.5–5.1)
Sodium: 141 mEq/L (ref 135–145)
Total Protein: 7.1 g/dL (ref 6.0–8.3)

## 2012-12-22 LAB — CBC
HCT: 41.8 % (ref 39.0–52.0)
MCHC: 34 g/dL (ref 30.0–36.0)
RDW: 13.8 % (ref 11.5–14.6)
WBC: 4.7 10*3/uL (ref 4.5–10.5)

## 2012-12-22 LAB — LIPID PANEL
Cholesterol: 201 mg/dL — ABNORMAL HIGH (ref 0–200)
HDL: 64.3 mg/dL (ref >39.00)
VLDL: 10.8 mg/dL (ref 0.0–40.0)

## 2012-12-22 LAB — URINALYSIS
Ketones, ur: NEGATIVE
Specific Gravity, Urine: 1.025 (ref 1.000–1.030)
Total Protein, Urine: NEGATIVE
Urine Glucose: NEGATIVE
Urobilinogen, UA: 0.2 (ref 0.0–1.0)

## 2012-12-22 LAB — LDL CHOLESTEROL, DIRECT: Direct LDL: 133 mg/dL

## 2012-12-25 ENCOUNTER — Ambulatory Visit: Payer: Medicare Other | Admitting: Endocrinology

## 2012-12-26 ENCOUNTER — Ambulatory Visit (INDEPENDENT_AMBULATORY_CARE_PROVIDER_SITE_OTHER): Payer: Medicare Other | Admitting: Endocrinology

## 2012-12-26 ENCOUNTER — Encounter: Payer: Self-pay | Admitting: Endocrinology

## 2012-12-26 VITALS — BP 130/78 | HR 66 | Temp 98.2°F | Resp 12 | Ht 73.0 in | Wt 202.2 lb

## 2012-12-26 DIAGNOSIS — A5216 Charcot's arthropathy (tabetic): Secondary | ICD-10-CM

## 2012-12-26 DIAGNOSIS — I4510 Unspecified right bundle-branch block: Secondary | ICD-10-CM

## 2012-12-26 DIAGNOSIS — N318 Other neuromuscular dysfunction of bladder: Secondary | ICD-10-CM

## 2012-12-26 DIAGNOSIS — Z0000 Encounter for general adult medical examination without abnormal findings: Secondary | ICD-10-CM

## 2012-12-26 DIAGNOSIS — M549 Dorsalgia, unspecified: Secondary | ICD-10-CM

## 2012-12-26 DIAGNOSIS — M14679 Charcot's joint, unspecified ankle and foot: Secondary | ICD-10-CM

## 2012-12-26 DIAGNOSIS — A5211 Tabes dorsalis: Secondary | ICD-10-CM

## 2012-12-26 DIAGNOSIS — N3281 Overactive bladder: Secondary | ICD-10-CM

## 2012-12-26 DIAGNOSIS — E7800 Pure hypercholesterolemia, unspecified: Secondary | ICD-10-CM

## 2012-12-26 MED ORDER — OMEPRAZOLE 20 MG PO CPDR: DELAYED_RELEASE_CAPSULE | ORAL | Status: DC

## 2012-12-26 MED ORDER — TOLTERODINE TARTRATE ER 2 MG PO CP24: 2.0000 mg | ORAL_CAPSULE | Freq: Every day | ORAL | Status: DC

## 2012-12-26 NOTE — Patient Instructions (Signed)
Take Meloxicam as needed only  Detrol at bedtime for urination

## 2012-12-26 NOTE — Progress Notes (Signed)
Phillip Shelton is a 78 y.o. male.   Chief complaint: Frequent urination  History of Present Illness:  1. He has had problems with frequent urination at night, getting up now every 2 hours. Does not always go back to sleep. Does not have any frequency of urination during the day and also no complaints of hesitancy, slow stream or dribbling. No history of previous prostatitis or UTI. Has been previously tried on Flomax and has seen urologist without benefit 2. Hypercholesterolemia: He has had fairly consistent levels of high cholesterol that the borderline level of about 130. Usually watching his saturated fat in the form of dairy products and fatty meats 3. He is having continued problems with foot deformity and requiring special diabetic shoes for his Charcot foot. However since he has had a tendon release surgery on the right leg he is not having pain. However he continues to take meloxicam on most of the days even though he will not have pain on the days he does not take it 4. Low back pain: He has pain when he is sitting down in the evening reclining on his lower back bilaterally but this is better after he moves around during the day. Does not desire any further treatment 5. History of pre-diabetes since 2009. Blood sugar levels have been minimally increased and had been normal in 2012 and 2013. Generally cutting back on snacks and eating smaller portions. Able to maintain his weight at about 200 6. Gastroesophageal reflux: This is less of a problem and he takes Prilosec only as needed when he has heartburn with certain kind of foods. Previously also possibly had cough from the reflux   PREVENTIVE CARE:  Fall risk screening done, negative Depression screening done, negative Diet: Discussed low saturated fat diet Vitamin D supplementation: Patient using supplement in cod liver oil, however will need to consider further supplementation based on levels Exercise: Patient encouraged to walk as  much as possible, patient's mobility limited by foot pain Patient is compliant with annual eye exams and dentist visits Immunization status: up to date and documented.  Cancer screening Colorectal screening: Stool Hemoccult given today PSA not indicated at his age     Medication List       This list is accurate as of: 12/26/12 10:57 AM.  Always use your most recent med list.               aspirin 81 MG tablet  Take 81 mg by mouth daily.     fish oil-omega-3 fatty acids 1000 MG capsule  Take 2 g by mouth daily.     meloxicam 7.5 MG tablet  Commonly known as:  MOBIC  Take 1 tablet (7.5 mg total) by mouth daily.     mupirocin ointment 2 %  Commonly known as:  BACTROBAN  Apply topically 3 (three) times daily.     neomycin-polymyxin-hydrocortisone 3.5-10000-1 otic suspension  Commonly known as:  CORTISPORIN     omeprazole 20 MG capsule  Commonly known as:  PRILOSEC  TAKE ONE CAPSULE BY MOUTH EVERY DAY     PREPARATION H EX  Apply topically as needed.     traMADol 50 MG tablet  Commonly known as:  ULTRAM  Take 1 tablet (50 mg total) by mouth every 6 (six) hours as needed.        Allergies: No Known Allergies  Past Medical History  Diagnosis Date  . Diverticulosis   . Hemorrhoids   . [place] - [place] tear 1981  .  Arthritis   . Esophageal stricture   . Hiatal hernia   . GERD (gastroesophageal reflux disease)     Past Surgical History  Procedure Laterality Date  . Hemorrhoid surgery  1990  . Umbilical hernia repair    . Cholecystectomy    . Colonoscopy    . Upper gastrointestinal endoscopy    . Gastrocnemius recession  02/03/2011    Procedure: GASTROCNEMIUS SLIDE;  Surgeon: Sherri Rad, MD;  Location: [facility];  Service: Orthopedics;  Laterality: Right;    Family History  Problem Relation Age of Onset  . Colon cancer Maternal Grandmother     Social History:  reports that he quit smoking about 32 years ago. He has never used  smokeless tobacco. He reports that he drinks alcohol. He reports that he does not use illicit drugs.  Review of Systems      No recent weight change.     Eyes: Normal vision, Normal exam yearly.               No unusual headaches.     ENT:  Periodically has ear wax and had this cleaned by ENT surgeon.   Skin: No pigmentation or rash.        Thyroid:  No cold or heat intolerance, unusual fatigue.     No chest pain on exertion.                 No palpitations.     No history of hypertension: Previously had mild increase in systolic reading but could not tolerate antihypertensives. He checks his blood pressure periodically and this is usually in the 130-140+ range     No leg pain on walking.                      No swelling of feet.     No shortness of breath on exertion. Had chronic cough  which resolved spontaneously. Was not relieved by Prilosec with the last episode     Bowel habits: Usually regular. Heartburn/pain:  May occasionally have reflux spicy food relieved by Prilosec       Rectal bleeding/black stools: Not present.       Has had some decrease in erections for several years. Has incomplete firmness and erections do not last long enough.      Given Cialis 10 mg by     urologist. Does not take this now as has nearly normal erections    In 2001 testosterone was normal.    For several years has a thickened tendon of left hand, gradually worseing.  Does sometimes interfere with hand function.  Also has hammertoes        No numbness or tingling in feet; no weakness in limbs. During the night he may feel numbness in his hands transiently.      Usually has no difficulty with balance on walking but he does feel unsteady when he closes his eyes      He has occasional late insomnia but no fatigue or depression.   LABS:  Appointment on 12/22/2012  Component Date Value Range Status  . Sodium 12/22/2012 141  135 - 145 mEq/L Final  . Potassium 12/22/2012 4.4  3.5 - 5.1 mEq/L  Final  . Chloride 12/22/2012 108  96 - 112 mEq/L Final  . CO2 12/22/2012 26  19 - 32 mEq/L Final  . Glucose, Bld 12/22/2012 96  70 - 99 mg/dL Final  . BUN 16/10/9602 16  6 - 23 mg/dL Final  . Creatinine, Ser 12/22/2012 0.9  0.4 - 1.5 mg/dL Final  . Total Bilirubin 12/22/2012 0.9  0.3 - 1.2 mg/dL Final  . Alkaline Phosphatase 12/22/2012 51  39 - 117 U/L Final  . AST 12/22/2012 17  0 - 37 U/L Final  . ALT 12/22/2012 14  0 - 53 U/L Final  . Total Protein 12/22/2012 7.1  6.0 - 8.3 g/dL Final  . Albumin 16/10/9602 4.2  3.5 - 5.2 g/dL Final  . Calcium 54/09/8117 9.5  8.4 - 10.5 mg/dL Final  . GFR 14/78/2956 86.09  >60.00 mL/min Final  . WBC 12/22/2012 4.7  4.5 - 10.5 K/uL Final  . RBC 12/22/2012 4.48  4.22 - 5.81 Mil/uL Final  . Platelets 12/22/2012 217.0  150.0 - 400.0 K/uL Final  . Hemoglobin 12/22/2012 14.2  13.0 - 17.0 g/dL Final  . HCT 21/30/8657 41.8  39.0 - 52.0 % Final  . MCV 12/22/2012 93.5  78.0 - 100.0 fl Final  . MCHC 12/22/2012 34.0  30.0 - 36.0 g/dL Final  . RDW 84/69/6295 13.8  11.5 - 14.6 % Final  . Cholesterol 12/22/2012 201* 0 - 200 mg/dL Final   ATP III Classification       Desirable:  < 200 mg/dL               Borderline High:  200 - 239 mg/dL          High:  > = 284 mg/dL  . Triglycerides 12/22/2012 54.0  0.0 - 149.0 mg/dL Final   Normal:  <132 mg/dLBorderline High:  150 - 199 mg/dL  . HDL 12/22/2012 64.30  >39.00 mg/dL Final  . VLDL 44/01/270 10.8  0.0 - 40.0 mg/dL Final  . Total CHOL/HDL Ratio 12/22/2012 3   Final                  Men          Women1/2 Average Risk     3.4          3.3Average Risk          5.0          4.42X Average Risk          9.6          7.13X Average Risk          15.0          11.0                      . Color, Urine 12/22/2012 YELLOW  Yellow;Lt. Yellow Final  . APPearance 12/22/2012 CLEAR  Clear Final  . Specific Gravity, Urine 12/22/2012 1.025  1.000-1.030 Final  . pH 12/22/2012 5.5  5.0 - 8.0 Final  . Total Protein, Urine 12/22/2012  NEGATIVE  Negative Final  . Urine Glucose 12/22/2012 NEGATIVE  Negative Final  . Ketones, ur 12/22/2012 NEGATIVE  Negative Final  . Bilirubin Urine 12/22/2012 NEGATIVE  Negative Final  . Hgb urine dipstick 12/22/2012 NEGATIVE  Negative Final  . Urobilinogen, UA 12/22/2012 0.2  0.0 - 1.0 Final  . Leukocytes, UA 12/22/2012 NEGATIVE  Negative Final  . Nitrite 12/22/2012 NEGATIVE  Negative Final  . Direct LDL 12/22/2012 133.0   Final   Optimal:  <100 mg/dLNear or Above Optimal:  100-129 mg/dLBorderline High:  130-159 mg/dLHigh:  160-189 mg/dLVery High:  >190 mg/dL    EXAM:  BP 536/64  Pulse 66  Temp(Src) 98.2 F (36.8  C)  Resp 12  Ht 6\' 1"  (1.854 m)  Wt 202 lb 3.2 oz (91.717 kg)  BMI 26.68 kg/m2  SpO2 97%  Physical Exam  Vitals reviewed. Constitutional: He appears well-developed and well-nourished.  HENT:  Mouth/Throat: Oropharynx is clear and moist.  Eyes: Conjunctivae and EOM are normal.  Fundii normal  Neck: No thyromegaly present.  Cardiovascular: Normal rate, regular rhythm, normal heart sounds and intact distal pulses.  Exam reveals no gallop.   No murmur heard. Pulmonary/Chest: Breath sounds normal. He has no wheezes. He has no rales.  Abdominal: Soft. He exhibits no distension and no abdominal bruit. There is no hepatosplenomegaly.  Genitourinary:  Prostate minimally enlarged, about 1+ and soft and smooth. No masses in rectal ampulla  Musculoskeletal:  He has marked deformity of the bony structure of the feet especially the right with deviation of the tarsal bones. Also has hallux valgus especially right. Right hand shows  Dupuytren's contracture with significant thickening of the tendon in the palm of the third finger. Mild thickening of the left palmar tendon also  Neurological: He is alert. He displays no tremor. He exhibits normal muscle tone.  Reflex Scores:      Bicep reflexes are 0 on the right side and 0 on the left side.      Achilles reflexes are 0 on the  right side and 0 on the left side. Romberg sign mildly positive Vibration sense moderately reduced in the right great toe and only minimal  reduced on the left. Monofilament sensation normal on the toes bilaterally  Skin: Skin is warm and dry.  Psychiatric: He has a normal mood and affect.   Assessment/Plan:   1. Mild impaired fasting glucose of 101 in 2009, has been consistently normal subsequently 2. Nocturia, probably from overactive bladder since he does not appear to have significant BPH. He will be given a trial of Detrol at bedtime to see if it will benefit him especially with his history of insomnia 3.  Hypercholesterolemia with previous LDL level 164. The diet has been successful in bringing it down to about the 130 level. Since he has no other risk factors will not consider pharmacological treatment 4.  Thickened tendon left hand with mild restriction of moment. He can be referred to hand surgeon for release of his Dupuytren's contracture 5.  Mild asymptomatic sinus bradycardia with heart rate in the 60s 6.  Foot pain and deformities from Charcot foot on the right, relieved by surgical treatment of tendon release. Advised him to leave off meloxicam and take it only as needed to avoid long-term side effects 7. Erectile disfunction, mild and not desiring treatment at this time . Likely to be from atherosclerosis and history of smoking 8.  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 Also has positive Romberg sign. However has no significant gait abnormalities or history of fall  9.  Hammertoes and hallux valgus bilaterally. 10. Low normal WBC count of 4700, previously as low as 4000  He will do stool Hemoccult screening  He will continue to followup annually  [facility] 12/26/2012, 10:57 AM

## 2012-12-27 DIAGNOSIS — A5216 Charcot's arthropathy (tabetic): Secondary | ICD-10-CM

## 2012-12-27 DIAGNOSIS — M14679 Charcot's joint, unspecified ankle and foot: Secondary | ICD-10-CM

## 2012-12-27 DIAGNOSIS — N3281 Overactive bladder: Secondary | ICD-10-CM

## 2012-12-27 DIAGNOSIS — E7800 Pure hypercholesterolemia, unspecified: Secondary | ICD-10-CM | POA: Insufficient documentation

## 2012-12-27 HISTORY — DX: Pure hypercholesterolemia, unspecified: E78.00

## 2012-12-27 HISTORY — DX: Charcot's arthropathy (tabetic): A52.16

## 2012-12-27 HISTORY — DX: Charcot's joint, unspecified ankle and foot: M14.679

## 2012-12-27 HISTORY — DX: Overactive bladder: N32.81

## 2013-03-27 ENCOUNTER — Emergency Department (HOSPITAL_COMMUNITY)
Admission: EM | Admit: 2013-03-27 | Discharge: 2013-03-27 | Disposition: A | Discharge status: Home / Self Care | Payer: Medicare Other | Attending: Emergency Medicine | Admitting: Emergency Medicine

## 2013-03-27 ENCOUNTER — Emergency Department (HOSPITAL_COMMUNITY): Payer: Medicare Other

## 2013-03-27 ENCOUNTER — Encounter (HOSPITAL_COMMUNITY): Payer: Self-pay | Admitting: Emergency Medicine

## 2013-03-27 DIAGNOSIS — M25579 Pain in unspecified ankle and joints of unspecified foot: Secondary | ICD-10-CM | POA: Insufficient documentation

## 2013-03-27 DIAGNOSIS — Z8719 Personal history of other diseases of the digestive system: Secondary | ICD-10-CM | POA: Insufficient documentation

## 2013-03-27 DIAGNOSIS — M129 Arthropathy, unspecified: Secondary | ICD-10-CM | POA: Insufficient documentation

## 2013-03-27 DIAGNOSIS — Z79899 Other long term (current) drug therapy: Secondary | ICD-10-CM | POA: Insufficient documentation

## 2013-03-27 DIAGNOSIS — Z791 Long term (current) use of non-steroidal anti-inflammatories (NSAID): Secondary | ICD-10-CM | POA: Insufficient documentation

## 2013-03-27 DIAGNOSIS — Z8679 Personal history of other diseases of the circulatory system: Secondary | ICD-10-CM | POA: Insufficient documentation

## 2013-03-27 DIAGNOSIS — M25571 Pain in right ankle and joints of right foot: Secondary | ICD-10-CM

## 2013-03-27 DIAGNOSIS — Z7982 Long term (current) use of aspirin: Secondary | ICD-10-CM | POA: Insufficient documentation

## 2013-03-27 DIAGNOSIS — Z87891 Personal history of nicotine dependence: Secondary | ICD-10-CM | POA: Insufficient documentation

## 2013-03-27 NOTE — ED Notes (Signed)
Pt c/o ankle pain since this morning.  Denies injury.  Hx of arthritis.

## 2013-03-27 NOTE — Discharge Instructions (Signed)
Ankle Pain  Ankle pain is a common symptom. The bones, cartilage, tendons, and muscles of the ankle joint perform a lot of work each day. The ankle joint holds your body weight and allows you to move around. Ankle pain can occur on either side or back of 1 or both ankles. Ankle pain may be sharp and burning or dull and aching. There may be tenderness, stiffness, redness, or warmth around the ankle. The pain occurs more often when a person walks or puts pressure on the ankle.  CAUSES   There are many reasons ankle pain can develop. It is important to work with your caregiver to identify the cause since many conditions can impact the bones, cartilage, muscles, and tendons. Causes for ankle pain include:  · Injury, including a break (fracture), sprain, or strain often due to a fall, sports, or a high-impact activity.  · Swelling (inflammation) of a tendon (tendonitis).  · Achilles tendon rupture.  · Ankle instability after repeated sprains and strains.  · Poor foot alignment.  · Pressure on a nerve (tarsal tunnel syndrome).  · Arthritis in the ankle or the lining of the ankle.  · Crystal formation in the ankle (gout or pseudogout).  DIAGNOSIS   A diagnosis is based on your medical history, your symptoms, results of your physical exam, and results of diagnostic tests. Diagnostic tests may include X-ray exams or a computerized magnetic scan (magnetic resonance imaging, MRI).  TREATMENT   Treatment will depend on the cause of your ankle pain and may include:  · Keeping pressure off the ankle and limiting activities.  · Using crutches or other walking support (a cane or brace).  · Using rest, ice, compression, and elevation.  · Participating in physical therapy or home exercises.  · Wearing shoe inserts or special shoes.  · Losing weight.  · Taking medications to reduce pain or swelling or receiving an injection.  · Undergoing surgery.  HOME CARE INSTRUCTIONS   · Only take over-the-counter or prescription medicines for  pain, discomfort, or fever as directed by your caregiver.  · Put ice on the injured area.  · Put ice in a plastic bag.  · Place a towel between your skin and the bag.  · Leave the ice on for 15-20 minutes at a time, 03-04 times a day.  · Keep your leg raised (elevated) when possible to lessen swelling.  · Avoid activities that cause ankle pain.  · Follow specific exercises as directed by your caregiver.  · Record how often you have ankle pain, the location of the pain, and what it feels like. This information may be helpful to you and your caregiver.  · Ask your caregiver about returning to work or sports and whether you should drive.  · Follow up with your caregiver for further examination, therapy, or testing as directed.  SEEK MEDICAL CARE IF:   · Pain or swelling continues or worsens beyond 1 week.  · You have an oral temperature above 102° F (38.9° C).  · You are feeling unwell or have chills.  · You are having an increasingly difficult time with walking.  · You have loss of sensation or other new symptoms.  · You have questions or concerns.  MAKE SURE YOU:   · Understand these instructions.  · Will watch your condition.  · Will get help right away if you are not doing well or get worse.  Document Released: 06/10/2009 Document Revised: 03/15/2011 Document Reviewed: 06/10/2009  [employer]®   Patient Information ©2014 [employer].

## 2013-03-27 NOTE — ED Provider Notes (Signed)
CSN: [id number]     Arrival date & time 03/27/13  1815 History   First MD Initiated Contact with Patient 03/27/13 1910     Chief Complaint  Patient presents with  . Ankle Pain     (Consider location/radiation/quality/duration/timing/severity/associated sxs/prior Treatment) HPI Phillip Shelton 79 year old male with a past medical history of peripheral neuropathy, diverticulosis, hemorrhoids, esophageal stricture, hiatal hernia, and Charcot disease of the ankles.  He is followed by [facility].  Patient has had previous history of gastrocnemius recession surgery and 2013 on the right ankle.  Patient states that today while he was standing up out of a chair he had sudden severe sharp pain in the right ankle.  He denies any injury to the ankle.  Patient had great difficulty walking.  Patient took 2 extra strength Tylenol had significant relief of his pain was able to ambulate.  Patient states that roughly 4 hours later as he was standing and taking care of some dishes he noticed the pain came back and states it was so severe he was unable to ambulate.  Again patient took Tylenol and the pain was relieved.  He came to the emergency department for evaluation.  Patient denies any heat, redness.  He does have some swelling in the joint which is greater than normal. Denies fevers, chills, myalgias, arthralgias. Denies DOE, SOB, chest tightness or pressure, radiation to left arm, jaw or back, or diaphoresis. Denies dysuria, flank pain, suprapubic pain, frequency, urgency, or hematuria. Denies headaches, light headedness, weakness, visual disturbances. Denies abdominal pain, nausea, vomiting, diarrhea or constipation.     Past Medical History  Diagnosis Date  . Diverticulosis   . Hemorrhoids   . [place] tear 1981  . Arthritis   . Esophageal stricture   . Hiatal hernia   . GERD (gastroesophageal reflux disease)    Past Surgical History  Procedure Laterality Date  . Hemorrhoid  surgery  1990  . Umbilical hernia repair    . Cholecystectomy    . Colonoscopy    . Upper gastrointestinal endoscopy    . Gastrocnemius recession  02/03/2011    Procedure: GASTROCNEMIUS SLIDE;  Surgeon: Colin Rhein, MD;  Location: [place];  Service: Orthopedics;  Laterality: Right;   Family History  Problem Relation Age of Onset  . Colon cancer Maternal Grandmother    History  Substance Use Topics  . Smoking status: Former Smoker    Quit date: 01/29/1980  . Smokeless tobacco: Never Used  . Alcohol Use: Yes     Comment: 2 beers daily    Review of Systems  Ten systems reviewed and are negative for acute change, except as noted in the HPI.    Allergies  Review of patient's allergies indicates no known allergies.  Home Medications   Current Outpatient Rx  Name  Route  Sig  Dispense  Refill  . aspirin 81 MG tablet   Oral   Take 81 mg by mouth daily.           . fish oil-omega-3 fatty acids 1000 MG capsule   Oral   Take 1 g by mouth daily.          . meloxicam (MOBIC) 7.5 MG tablet   Oral   Take 1 tablet (7.5 mg total) by mouth daily.   30 tablet   5    BP 145/72  Pulse 72  Temp(Src) 98.6 F (37 C) (Oral)  Resp 16  SpO2 98% Physical Exam Physical  Exam  Nursing note and vitals reviewed. Constitutional: He appears well-developed and well-nourished. No distress.  HENT:  Head: Normocephalic and atraumatic.  Eyes: Conjunctivae normal are normal. No scleral icterus.  Neck: Normal range of motion. Neck supple.  Cardiovascular: Normal rate, regular rhythm and normal heart sounds.   Pulmonary/Chest: Effort normal and breath sounds normal. No respiratory distress.  Abdominal: Soft. There is no tenderness.  Musculoskeletal: Right ankle with severely decreased range of motion, but appears to be effusion in the joint.  Patient is tender over the dorsal flexor surface of the ankle.  Patient has rocker-bottom foot, distal pulses are intact, deep  wiggle his toes and sensation is intact.   Neurological: He is alert.  Skin: Skin is warm and dry. He is not diaphoretic.  Psychiatric: His behavior is normal.    ED Course  Procedures (including critical care time) Labs Review Labs Reviewed - No data to display Imaging Review Dg Ankle Complete Right  03/27/2013   CLINICAL DATA:  Right ankle pain.  EXAM: RIGHT ANKLE - COMPLETE 3+ VIEW  COMPARISON:  None.  FINDINGS: There is a well corticated ossicle medial to the medial malleolus, possibly sequelae of prior trauma. Tibiotalar osteophytosis is present. Degenerative changes and partial collapse are partially visualized in the midfoot. Small posterior and plantar calcaneal enthesophytes are present. The bones are diffusely osteopenic. No acute fracture is identified. There is diffuse soft tissue swelling about the ankle.  IMPRESSION: 1. No definite acute osseous abnormality identified. Ankle soft tissue swelling. 2. Mild degenerative changes in the hindfoot with advanced degenerative change partially visualized in the midfoot.   Electronically Signed   By: Logan Bores   On: 03/27/2013 20:16     EKG Interpretation None      MDM   Final diagnoses:  Right ankle pain    Patient with right ankle pain.  No acute abnormalities on the plain film.I personally reviewed the images using our PACS system.  Feel the patient probably has an effusion of the ankle joint capsule.  Have referred him back to greet her orthopedist for further evaluation.  I doubt septic joint or gout as it does not fit his clinical picture.  Patient understands the plan of care and agrees.  Return precautions discussed.  Patient appears safe for discharge at this time.     Margarita Mail, PA-C 03/29/13 1232

## 2013-03-27 NOTE — ED Notes (Signed)
Pt states this morning went to get up out of chair and right ankle started hurting, states has a hx of arthritis in both ankles, states took tylenol and was fine until about 3 pm and then it started hurting again when he tried to walk, last took 500mg  tylenol around 4:30 pm, states pain is 7/10 when walking. Pt denies injury.

## 2013-03-30 NOTE — ED Provider Notes (Signed)
Medical screening examination/treatment/procedure(s) were performed by non-physician practitioner and as supervising physician I was immediately available for consultation/collaboration.  Leota Jacobsen, MD 03/30/13 [phone]

## 2013-06-13 ENCOUNTER — Other Ambulatory Visit: Payer: Self-pay | Admitting: Endocrinology

## 2013-07-23 ENCOUNTER — Ambulatory Visit (INDEPENDENT_AMBULATORY_CARE_PROVIDER_SITE_OTHER): Payer: Medicare Other | Admitting: Podiatry

## 2013-07-23 ENCOUNTER — Encounter: Payer: Self-pay | Admitting: Podiatry

## 2013-07-23 VITALS — BP 153/85 | HR 62 | Resp 15 | Ht 74.0 in | Wt 200.0 lb

## 2013-07-23 DIAGNOSIS — G589 Mononeuropathy, unspecified: Secondary | ICD-10-CM

## 2013-07-23 DIAGNOSIS — L738 Other specified follicular disorders: Secondary | ICD-10-CM

## 2013-07-23 NOTE — Progress Notes (Signed)
   Subjective:    Patient ID: Phillip Shelton, male    DOB: 1934-07-17, 79 y.o.   MRN: [id number]  HPI Comments: N skin problem L B/L plantar feet D 2 weeks O C rough, cracked skin A unknown T OTC hand lotion    the dry and cracked skin has improved considerably since the daily application of the skin lotion    Review of Systems  All other systems reviewed and are negative.      Objective:   Physical Exam Orientated x3 white male  Vascular: DP and PT pulses 2/4 bilaterally  Neurological: Sensation to 10 g monofilament wire intact 0/5 right and 2/5 left Ankle flex equal and reactive bilaterally Vibratory sensation nonreactive bilaterally  Dermatological: Skin texture and turgor were appears within normal limits  Musculoskeletal: Significant midfoot hypertrophy noted bilaterally with restriction range of motion of the mid foot bilaterally Hammertoe deformity 2-5 bilaterally      Assessment & Plan:   Assessment: Resolving xerosis from patient self treatment with skin lotion 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

## 2013-07-23 NOTE — Patient Instructions (Signed)
Apply all-purpose skin lotion to your feet daily Return as needed

## 2013-09-20 ENCOUNTER — Encounter: Payer: Self-pay | Admitting: Gastroenterology

## 2013-10-29 ENCOUNTER — Ambulatory Visit (INDEPENDENT_AMBULATORY_CARE_PROVIDER_SITE_OTHER): Payer: Medicare Other | Admitting: *Deleted

## 2013-10-29 DIAGNOSIS — Z23 Encounter for immunization: Secondary | ICD-10-CM

## 2013-12-17 ENCOUNTER — Other Ambulatory Visit: Payer: Self-pay | Admitting: Dermatology

## 2013-12-21 ENCOUNTER — Other Ambulatory Visit: Payer: Self-pay | Admitting: *Deleted

## 2013-12-21 ENCOUNTER — Other Ambulatory Visit (INDEPENDENT_AMBULATORY_CARE_PROVIDER_SITE_OTHER): Payer: Medicare Other

## 2013-12-21 DIAGNOSIS — E7800 Pure hypercholesterolemia, unspecified: Secondary | ICD-10-CM

## 2013-12-21 LAB — COMPREHENSIVE METABOLIC PANEL
ALK PHOS: 51 U/L (ref 39–117)
ALT: 10 U/L (ref 0–53)
AST: 16 U/L (ref 0–37)
Albumin: 4 g/dL (ref 3.5–5.2)
BUN: 18 mg/dL (ref 6–23)
CO2: 26 mEq/L (ref 19–32)
Calcium: 9.3 mg/dL (ref 8.4–10.5)
Chloride: 105 mEq/L (ref 96–112)
Creatinine, Ser: 0.9 mg/dL (ref 0.4–1.5)
GFR: 91.65 mL/min (ref >60.00)
Glucose, Bld: 91 mg/dL (ref 70–99)
POTASSIUM: 4.4 meq/L (ref 3.5–5.1)
SODIUM: 138 meq/L (ref 135–145)
TOTAL PROTEIN: 6.7 g/dL (ref 6.0–8.3)
Total Bilirubin: 0.8 mg/dL (ref 0.2–1.2)

## 2013-12-21 LAB — LIPID PANEL
Cholesterol: 207 mg/dL — ABNORMAL HIGH (ref 0–200)
HDL: 57.5 mg/dL (ref >39.00)
LDL CALC: 136 mg/dL — AB (ref 0–99)
NonHDL: 149.5
Total CHOL/HDL Ratio: 4
Triglycerides: 67 mg/dL (ref 0.0–149.0)
VLDL: 13.4 mg/dL (ref 0.0–40.0)

## 2013-12-21 LAB — CBC
HEMATOCRIT: 41.1 % (ref 39.0–52.0)
Hemoglobin: 13.6 g/dL (ref 13.0–17.0)
MCHC: 33 g/dL (ref 30.0–36.0)
MCV: 94.8 fl (ref 78.0–100.0)
Platelets: 214 10*3/uL (ref 150.0–400.0)
RBC: 4.34 Mil/uL (ref 4.22–5.81)
RDW: 13.4 % (ref 11.5–15.5)
WBC: 5.2 10*3/uL (ref 4.0–10.5)

## 2013-12-26 ENCOUNTER — Other Ambulatory Visit (INDEPENDENT_AMBULATORY_CARE_PROVIDER_SITE_OTHER): Payer: Medicare Other

## 2013-12-26 ENCOUNTER — Encounter: Payer: Self-pay | Admitting: Endocrinology

## 2013-12-26 ENCOUNTER — Ambulatory Visit (INDEPENDENT_AMBULATORY_CARE_PROVIDER_SITE_OTHER): Payer: Medicare Other | Admitting: Endocrinology

## 2013-12-26 VITALS — BP 132/68 | HR 66 | Temp 98.2°F | Resp 14 | Ht 74.0 in | Wt 198.8 lb

## 2013-12-26 DIAGNOSIS — E7800 Pure hypercholesterolemia, unspecified: Secondary | ICD-10-CM

## 2013-12-26 DIAGNOSIS — N3281 Overactive bladder: Secondary | ICD-10-CM

## 2013-12-26 DIAGNOSIS — Z23 Encounter for immunization: Secondary | ICD-10-CM

## 2013-12-26 LAB — URINALYSIS, ROUTINE W REFLEX MICROSCOPIC
Bilirubin Urine: NEGATIVE
HGB URINE DIPSTICK: NEGATIVE
Ketones, ur: NEGATIVE
LEUKOCYTES UA: NEGATIVE
Nitrite: NEGATIVE
RBC / HPF: NONE SEEN (ref 0–?)
SPECIFIC GRAVITY, URINE: 1.025 (ref 1.000–1.030)
Total Protein, Urine: NEGATIVE
UROBILINOGEN UA: 0.2 (ref 0.0–1.0)
Urine Glucose: NEGATIVE
pH: 5.5 (ref 5.0–8.0)

## 2013-12-26 LAB — MICROALBUMIN / CREATININE URINE RATIO
Creatinine,U: 154.1 mg/dL
Microalb Creat Ratio: 2.1 mg/g (ref 0.0–30.0)
Microalb, Ur: 3.3 mg/dL — ABNORMAL HIGH (ref 0.0–1.9)

## 2013-12-26 NOTE — Progress Notes (Signed)
Phillip Shelton is a 79 y.o. male.   Chief complaint: Frequent urination  History of Present Illness:  1. He has had problems with frequent urination at night, getting up every 2 hours. Does not have any frequency of urination during the day and also no complaints of hesitancy, slow stream or dribbling. Has been previously tried on Flomax and has seen urologist without benefit 2. Hypercholesterolemia: He has had fairly consistent levels of high cholesterol with borderline LDL level of about 130. Usually watching his saturated fat coming from dairy products and fatty meats 3. He is asking about hip pain and taking medications for this.  However he is already taking meloxicam for his foot  4. Balance: This appears to be little worse over the last year but he noticed this mostly if he is walking on uneven grounds 5. Low back pain: less recently and not limiting his activity 6. History of pre-diabetes since 2009. Blood sugar levels have been minimally increased and had been normal subsequently, now 91 Has been cutting back on snacks and eating smaller portions. Able to maintain his weight   7. Gastroesophageal reflux: This is less of a problem, takes Prilosec as needed     Wt Readings from Last 3 Encounters:  12/26/13 198 lb 12.8 oz (90.175 kg)  07/23/13 200 lb (90.719 kg)  12/26/12 202 lb 3.2 oz (91.717 kg)    LABS:   Office Visit on 12/26/2013  Component Date Value Ref Range Status  . Microalb, Ur 12/26/2013 3.3* 0.0 - 1.9 mg/dL Final  . Creatinine,U 12/26/2013 154.1   Final  . Microalb Creat Ratio 12/26/2013 2.1  0.0 - 30.0 mg/g Final  Appointment on 12/21/2013  Component Date Value Ref Range Status  . Sodium 12/21/2013 138  135 - 145 mEq/L Final  . Potassium 12/21/2013 4.4  3.5 - 5.1 mEq/L Final  . Chloride 12/21/2013 105  96 - 112 mEq/L Final  . CO2 12/21/2013 26  19 - 32 mEq/L Final  . Glucose, Bld 12/21/2013 91  70 - 99 mg/dL Final  . BUN 12/21/2013 18  6 - 23 mg/dL Final  .  Creatinine, Ser 12/21/2013 0.9  0.4 - 1.5 mg/dL Final  . Total Bilirubin 12/21/2013 0.8  0.2 - 1.2 mg/dL Final  . Alkaline Phosphatase 12/21/2013 51  39 - 117 U/L Final  . AST 12/21/2013 16  0 - 37 U/L Final  . ALT 12/21/2013 10  0 - 53 U/L Final  . Total Protein 12/21/2013 6.7  6.0 - 8.3 g/dL Final  . Albumin 12/21/2013 4.0  3.5 - 5.2 g/dL Final  . Calcium 12/21/2013 9.3  8.4 - 10.5 mg/dL Final  . GFR 12/21/2013 91.65  >60.00 mL/min Final  . WBC 12/21/2013 5.2  4.0 - 10.5 K/uL Final  . RBC 12/21/2013 4.34  4.22 - 5.81 Mil/uL Final  . Platelets 12/21/2013 214.0  150.0 - 400.0 K/uL Final  . Hemoglobin 12/21/2013 13.6  13.0 - 17.0 g/dL Final  . HCT 12/21/2013 41.1  39.0 - 52.0 % Final  . MCV 12/21/2013 94.8  78.0 - 100.0 fl Final  . MCHC 12/21/2013 33.0  30.0 - 36.0 g/dL Final  . RDW 12/21/2013 13.4  11.5 - 15.5 % Final  . Cholesterol 12/21/2013 207* 0 - 200 mg/dL Final   ATP III Classification       Desirable:  < 200 mg/dL               Borderline High:  200 - 239  mg/dL          High:  > = 240 mg/dL  . Triglycerides 12/21/2013 67.0  0.0 - 149.0 mg/dL Final   Normal:  <150 mg/dLBorderline High:  150 - 199 mg/dL  . HDL 12/21/2013 57.50  >39.00 mg/dL Final  . VLDL 12/21/2013 13.4  0.0 - 40.0 mg/dL Final  . LDL Cholesterol 12/21/2013 136* 0 - 99 mg/dL Final  . Total CHOL/HDL Ratio 12/21/2013 4   Final                  Men          Women1/2 Average Risk     3.4          3.3Average Risk          5.0          4.42X Average Risk          9.6          7.13X Average Risk          15.0          11.0                      . NonHDL 12/21/2013 149.50   Final   NOTE:  Non-HDL goal should be 30 mg/dL higher than patient's LDL goal (i.e. LDL goal of < 70 mg/dL, would have non-HDL goal of < 100 mg/dL)    PREVENTIVE CARE:  Immunization status: Needs Prevnar    Cancer screening Colorectal screening: Stool Hemoccult given today, not clear if he had these done a year ago       Medication List        This list is accurate as of: 12/26/13 10:17 AM.  Always use your most recent med list.               aspirin 81 MG tablet  Take 81 mg by mouth daily.     fish oil-omega-3 fatty acids 1000 MG capsule  Take 1 g by mouth daily.     meloxicam 7.5 MG tablet  Commonly known as:  MOBIC  TAKE 1 TABLET (7.5 MG TOTAL) BY MOUTH DAILY.     omeprazole 20 MG capsule  Commonly known as:  PRILOSEC  Take 20 mg by mouth as needed.        Allergies: No Known Allergies  Past Medical History  Diagnosis Date  . Diverticulosis   . Hemorrhoids   . [place] tear 1981  . Arthritis   . Esophageal stricture   . Hiatal hernia   . GERD (gastroesophageal reflux disease)     Past Surgical History  Procedure Laterality Date  . Hemorrhoid surgery  1990  . Umbilical hernia repair    . Cholecystectomy    . Colonoscopy    . Upper gastrointestinal endoscopy    . Gastrocnemius recession  02/03/2011    Procedure: GASTROCNEMIUS SLIDE;  Surgeon: Colin Rhein, MD;  Location: [facility];  Service: Orthopedics;  Laterality: Right;    Family History  Problem Relation Age of Onset  . Colon cancer Maternal Grandmother     Social History:  reports that he quit smoking about 33 years ago. He has never used smokeless tobacco. He reports that he drinks alcohol. He reports that he does not use illicit drugs.  Review of Systems    ?  Mild hypertension: Previously had mild increase in systolic reading but could not tolerate  antihypertensives.  He checks his blood pressure periodically and this is usually in the 412-878 systolic range  He started taking aspirin 81 mg a few years ago because of an episode of numbness in his right foot and he was told that he may have had a circulation problem  EXAM:  BP 132/68 mmHg  Pulse 66  Temp(Src) 98.2 F (36.8 C)  Resp 14  Ht 6\' 2"  (1.88 m)  Wt 198 lb 12.8 oz (90.175 kg)  BMI 25.51 kg/m2  SpO2 97%  Physical Exam   No pedal  edema  Assessment/Plan:   1. Mild impaired fasting glucose of 101 in 2009, has been consistently normal and now fasting glucose 91  2. Nocturia, probably from overactive bladder since he does not appear to have significant BPH. He does not like to take medications even though he was tried on Detrol a year ago.  Will discuss this again on his next visit.    3.  Hypercholesterolemia with previous LDL level 164. The diet has been successful in bringing it down to about the 130 level. Since he has no other risk factors will  continue diet alone 4.  Preventive care: Asked him that he can stop his 49 aspirin as this is not necessary in his case and may potentially cause more side effects  He will do stool Hemoccult screening  To have Prevnar today  He will get his physical  in 3 months  Prisca Gearing 12/26/2013, 10:17 AM

## 2014-01-03 ENCOUNTER — Other Ambulatory Visit: Payer: Self-pay | Admitting: Endocrinology

## 2014-01-04 HISTORY — PX: HAND SURGERY: SHX662

## 2014-03-04 ENCOUNTER — Encounter: Payer: Self-pay | Admitting: Gastroenterology

## 2014-03-27 ENCOUNTER — Encounter: Payer: Self-pay | Admitting: Endocrinology

## 2014-03-27 ENCOUNTER — Ambulatory Visit (INDEPENDENT_AMBULATORY_CARE_PROVIDER_SITE_OTHER): Payer: Medicare Other | Admitting: Endocrinology

## 2014-03-27 ENCOUNTER — Other Ambulatory Visit: Payer: Self-pay | Admitting: *Deleted

## 2014-03-27 VITALS — BP 147/71 | HR 67 | Temp 98.3°F | Resp 16 | Ht 72.0 in | Wt 193.8 lb

## 2014-03-27 DIAGNOSIS — N3281 Overactive bladder: Secondary | ICD-10-CM

## 2014-03-27 DIAGNOSIS — E7800 Pure hypercholesterolemia, unspecified: Secondary | ICD-10-CM

## 2014-03-27 DIAGNOSIS — Z23 Encounter for immunization: Secondary | ICD-10-CM | POA: Diagnosis not present

## 2014-03-27 DIAGNOSIS — M14679 Charcot's joint, unspecified ankle and foot: Secondary | ICD-10-CM

## 2014-03-27 DIAGNOSIS — A5216 Charcot's arthropathy (tabetic): Secondary | ICD-10-CM

## 2014-03-27 DIAGNOSIS — I4510 Unspecified right bundle-branch block: Secondary | ICD-10-CM | POA: Diagnosis not present

## 2014-03-27 DIAGNOSIS — M47817 Spondylosis without myelopathy or radiculopathy, lumbosacral region: Secondary | ICD-10-CM | POA: Diagnosis not present

## 2014-03-27 DIAGNOSIS — Z0000 Encounter for general adult medical examination without abnormal findings: Secondary | ICD-10-CM | POA: Diagnosis not present

## 2014-03-27 DIAGNOSIS — Z1211 Encounter for screening for malignant neoplasm of colon: Secondary | ICD-10-CM

## 2014-03-27 NOTE — Progress Notes (Signed)
Phillip Shelton is a 80 y.o. male.   Chief complaint:  Hip pain off and on  History of Present Illness:   1. Right "hip" pain: He has had pain on and off for the last few weeks in the right hip area which is more on walking or activities.  Although he has had lower back pain also pain the pain is mostly localized to the right buttock area without much radiation.  He had been already taking 7.5 mg of meloxicam.  He saw the orthopedic doctor a few days ago and has been taking 15 mg of meloxicam for the last week or so.  He thinks he is feeling less pain with this.  He was told to have arthritis and scoliosis but not clear if he has had arthritis in his hip joint also.     2. Hypercholesterolemia: He has had fairly consistent levels of cholesterol that are at the borderline level of about 130. Usually watching his saturated fat in the form of dairy products and fatty meats.  Also has lost weight recently  Lab Results  Component Value Date   CHOL 207* 12/21/2013   HDL 57.50 12/21/2013   LDLCALC 136* 12/21/2013   LDLDIRECT 133.0 12/22/2012   TRIG 67.0 12/21/2013   CHOLHDL 4 12/21/2013    3. He has had chronic foot deformity and requiring special diabetic shoes for his Charcot foot. However since he has had a tendon release surgery on the right leg he is not having pain.  He continues to take meloxicam fairly regularly still, has been followed by orthopedic doctor for this 4. He has had problems with frequent urination at night, getting up about 4 times.  Does not have any frequency of urination during the day and also no complaints of hesitancy, slow stream or dribbling. No history of previous prostatitis or UTI. Has been previously tried on Flomax and has seen urologist without benefit.  Prefers to be not trying any medication at this time. 5. History of pre-diabetes since 2009. Blood sugar levels have been minimally increased and had been normal in 2012 and 2013. Generally cutting back on  snacks and eating smaller portions.  His last fasting glucose was 91 6. Gastroesophageal reflux: This is less of a problem and he takes Prilosec only as needed when he has heartburn with certain kind of foods. Previously also possibly had cough from the reflux  PREVENTIVE CARE:  Fall risk screening done, has had no falls Depression screening done, negative Diet: Discussed low saturated fat diet Vitamin D supplementation: Patient using supplement in cod liver oil, however will need to consider further supplementation based on levels Exercise: Patient encouraged to walk as much as possible, patient's mobility limited by foot and low back pain Patient is compliant with regular eye exams and dentist visits Immunization status: and documented. Tetanus overdue, last given in 1997  Cancer screening: colonoscopy in March 2011 Colorectal screening: Stool Hemoccult given today PSA not indicated at his age     Medication List       This list is accurate as of: 03/27/14 10:54 AM.  Always use your most recent med list.               fish oil-omega-3 fatty acids 1000 MG capsule  Take 1 g by mouth daily.     meloxicam 15 MG tablet  Commonly known as:  MOBIC  Take 15 mg by mouth daily.     omeprazole 20 MG capsule  Commonly  known as:  PRILOSEC  Take 20 mg by mouth as needed.        Allergies: No Known Allergies  Past Medical History  Diagnosis Date  . Diverticulosis   . Hemorrhoids   . [place] tear 1981  . Arthritis   . Esophageal stricture   . Hiatal hernia   . GERD (gastroesophageal reflux disease)     Past Surgical History  Procedure Laterality Date  . Hemorrhoid surgery  1990  . Umbilical hernia repair    . Cholecystectomy    . Colonoscopy    . Upper gastrointestinal endoscopy    . Gastrocnemius recession  02/03/2011    Procedure: GASTROCNEMIUS SLIDE;  Surgeon: Colin Rhein, MD;  Location: [facility];  Service: Orthopedics;  Laterality:  Right;    Family History  Problem Relation Age of Onset  . Colon cancer Maternal Grandmother     Social History:  reports that he quit smoking about 34 years ago. He has never used smokeless tobacco. He reports that he drinks alcohol. He reports that he does not use illicit drugs.  Review of Systems       He has had mild weight loss, this is despite fairly good appetite.  He thinks he is just eating smaller portions.  Wt Readings from Last 3 Encounters:  03/27/14 193 lb 12.8 oz (87.907 kg)  12/26/13 198 lb 12.8 oz (90.175 kg)  07/23/13 200 lb (90.719 kg)        Eyes: Normal vision, has had an examination every 2 years               No unusual headaches.     ENT:  has ear wax and had this cleaned by ENT surgeon q 6 months       Skin: No pigmentation or rash.        Thyroid:  No cold or heat intolerance, unusual fatigue.     No chest pain on exertion.                 No palpitations.     No history of hypertension: Previously had mild increase in systolic reading but could not tolerate antihypertensives.  He checks his blood pressure periodically and this recently was in 120s or 130s range     No leg pain on walking.                      No swelling of feet.     No shortness of breath on exertion. Had chronic cough  which resolved spontaneously. Was not relieved by Prilosec with the last episode     Bowel habits: Usually regular. Heartburn/pain:  May rarely have reflux spicy food relieved by Prilosec       Rectal bleeding/black stools: Not present now, occ hemorrhoids       Has had some decrease in erections for several years. Has incomplete firmness and erections do not last long enough.  Currently not concerned about this     Given Cialis 10 mg by urologist previously and has not used this for a long time.     In 2001 testosterone was normal.    For several years has a thickened tendon of left hand treated surgically in 1/16 with good results   Does have mild  thickening of the tendon on the left hand also without interfering with his function.  Also has hammer toes        No  numbness or tingling in feet; no weakness in limbs. During the night he may feel numbness in his hands transiently.      Usually has no difficulty with balance on walking but he does feel unsteady when he closes his eyes      He has no significant insomnia, no fatigue or depression.    EXAM:  BP 147/71 mmHg  Pulse 67  Temp(Src) 98.3 F (36.8 C)  Resp 16  Ht 6' (1.829 m)  Wt 193 lb 12.8 oz (87.907 kg)  BMI 26.28 kg/m2  SpO2 97%  Physical Exam  Constitutional: He appears well-developed and well-nourished.  HENT:  Mouth/Throat: Oropharynx is clear and moist.  Eyes: Conjunctivae and EOM are normal.  Fundii show normal discs and vessels  Neck: No thyromegaly present.  Cardiovascular: Normal rate, regular rhythm, normal heart sounds and intact distal pulses.  Exam reveals no gallop.   No murmur heard. Pulmonary/Chest: Breath sounds normal. He has no wheezes. He has no rales.  Mild hyperinflation of chest wall with liver dullness in the sixth space  Abdominal: Soft. He exhibits no distension, no pulsatile midline mass and no mass. There is no hepatosplenomegaly. There is no tenderness. No hernia.  Genitourinary: Rectum normal.  Prostate mildly enlarged, about 1-2+, slightly firmer on the left, smooth. No masses in rectal ampulla  Musculoskeletal: He exhibits no edema.  He has marked deformity of the bony structure of the feet especially the right with deviation of the tarsal bones. Also has hallux valgus especially right. Mild thickening of the left palmar tendon   Neurological: He is alert. He displays no tremor. He exhibits normal muscle tone.  Reflex Scores:      Bicep reflexes are 0 on the right side and 0 on the left side.      Achilles reflexes are 0 on the right side and 0 on the left side. Romberg sign mildly positive Monofilament sensation normal on the  toes bilaterally. Somewhat decreased monofilament sensation on the left plantar surfaces medially. Biceps and ankle reflexes are difficult to elicit  Skin: Skin is warm and dry.  Psychiatric: He has a normal mood and affect.  Vitals reviewed.  Assessment/Plan:   1. Mild impaired fasting glucose of 101 in 2009, has been consistently normal subsequently 2. Nocturia, probably from overactive bladder since he did not benefit from Flomax previously and does not have any objective urinary symptoms.  3.  Hypercholesterolemia with previous LDL level 164.  His diet has been successful in bringing it down to about the 130 level. Since he has no other risk factors will not consider pharmacological treatment and continue to follow periodically 4.  Right hip pain reportedly arthritis as per orthopedic surgeon but no details available.  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  He can continue this but will need to have follow-up chemistry panel in 3 weeks 5.  Mild asymptomatic sinus bradycardia with heart rate in the 60s, also has previous history of intraventricular conduction delay, will recheck EKG today 6.  Foot pain and deformities from Charcot foot on the right, relieved by surgical treatment of tendon release. Advised him to leave off meloxicam and take it only as needed to avoid long-term side effects 7. Erectile disfunction, mild and not desiring treatment at this time . Likely to be from atherosclerosis and history of smoking 8.  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 Also has mildly positive Romberg sign. However has no significant gait abnormalities or history of fall.  Also does not appear to have had any progressive change over the last year  9.  Hammertoes and hallux valgus bilaterally.  Reportedly has Charcot joint but minimal small fiber  sensory loss on exam 10.  Mild history of reflux using when necessary Prilosec  PREVENTIVE care: He is due for a colonoscopy as suggested by gastroenterologist but he is very reluctant to do that especially with his negative studies previously and only 1 grandfather having a family history of colon cancer He will do stool Hemoccult screening today and decide Tetanus toxoid booster given PSA testing not indicated at his age   [facility] 03/27/2014, 10:54 AM

## 2014-03-27 NOTE — Patient Instructions (Signed)
Take  1000 units of Vitamin D3 daily   Low saturated fat diet, low intake of sugar and salt  Regular dental and eye exams as recommended by the specialists  Make sure smoke detectors are functional at home Wear seat belts all the time

## 2014-04-11 ENCOUNTER — Other Ambulatory Visit (INDEPENDENT_AMBULATORY_CARE_PROVIDER_SITE_OTHER): Payer: Medicare Other

## 2014-04-11 DIAGNOSIS — Z1211 Encounter for screening for malignant neoplasm of colon: Secondary | ICD-10-CM | POA: Diagnosis not present

## 2014-04-11 LAB — FECAL OCCULT BLOOD, IMMUNOCHEMICAL: FECAL OCCULT BLD: NEGATIVE

## 2014-04-11 NOTE — Progress Notes (Signed)
Quick Note:  Please let patient know that the lab result is normal and no further action needed ______ 

## 2014-04-17 ENCOUNTER — Other Ambulatory Visit (INDEPENDENT_AMBULATORY_CARE_PROVIDER_SITE_OTHER): Payer: Medicare Other

## 2014-04-17 DIAGNOSIS — E7800 Pure hypercholesterolemia, unspecified: Secondary | ICD-10-CM

## 2014-04-17 LAB — COMPREHENSIVE METABOLIC PANEL
ALK PHOS: 56 U/L (ref 39–117)
ALT: 13 U/L (ref 0–53)
AST: 16 U/L (ref 0–37)
Albumin: 4.2 g/dL (ref 3.5–5.2)
BILIRUBIN TOTAL: 0.7 mg/dL (ref 0.2–1.2)
BUN: 18 mg/dL (ref 6–23)
CO2: 27 meq/L (ref 19–32)
Calcium: 9.6 mg/dL (ref 8.4–10.5)
Chloride: 103 mEq/L (ref 96–112)
Creatinine, Ser: 0.99 mg/dL (ref 0.40–1.50)
GFR: 77.85 mL/min (ref >60.00)
Glucose, Bld: 89 mg/dL (ref 70–99)
Potassium: 5.2 mEq/L — ABNORMAL HIGH (ref 3.5–5.1)
SODIUM: 136 meq/L (ref 135–145)
Total Protein: 7.1 g/dL (ref 6.0–8.3)

## 2014-04-22 NOTE — Progress Notes (Signed)
Quick Note:  Please let patient know that the potassium result is high, likely to be from meloxicam. Recommend trying to take only half tablet daily and recheck BMP in 1 week  ______

## 2014-04-23 ENCOUNTER — Other Ambulatory Visit: Payer: Self-pay | Admitting: *Deleted

## 2014-04-23 DIAGNOSIS — E875 Hyperkalemia: Secondary | ICD-10-CM

## 2014-04-25 ENCOUNTER — Other Ambulatory Visit: Payer: Medicare Other

## 2014-04-25 ENCOUNTER — Other Ambulatory Visit (INDEPENDENT_AMBULATORY_CARE_PROVIDER_SITE_OTHER): Payer: Medicare Other

## 2014-04-25 DIAGNOSIS — E875 Hyperkalemia: Secondary | ICD-10-CM

## 2014-04-25 LAB — BASIC METABOLIC PANEL
BUN: 18 mg/dL (ref 6–23)
CALCIUM: 9.5 mg/dL (ref 8.4–10.5)
CO2: 26 meq/L (ref 19–32)
CREATININE: 0.99 mg/dL (ref 0.40–1.50)
Chloride: 104 mEq/L (ref 96–112)
GFR: 77.84 mL/min (ref >60.00)
GLUCOSE: 96 mg/dL (ref 70–99)
Potassium: 4.6 mEq/L (ref 3.5–5.1)
Sodium: 136 mEq/L (ref 135–145)

## 2014-04-25 NOTE — Progress Notes (Signed)
Quick Note:  Please let patient know that the potassium result is normal and no further action needed ______ 

## 2014-06-27 ENCOUNTER — Encounter (HOSPITAL_COMMUNITY): Payer: Self-pay | Admitting: *Deleted

## 2014-06-27 ENCOUNTER — Emergency Department (HOSPITAL_COMMUNITY): Payer: Medicare Other

## 2014-06-27 ENCOUNTER — Emergency Department (HOSPITAL_COMMUNITY)
Admission: EM | Admit: 2014-06-27 | Discharge: 2014-06-27 | Disposition: A | Discharge status: Home / Self Care | Payer: Medicare Other | Attending: Emergency Medicine | Admitting: Emergency Medicine

## 2014-06-27 DIAGNOSIS — K219 Gastro-esophageal reflux disease without esophagitis: Secondary | ICD-10-CM | POA: Insufficient documentation

## 2014-06-27 DIAGNOSIS — M549 Dorsalgia, unspecified: Secondary | ICD-10-CM | POA: Insufficient documentation

## 2014-06-27 DIAGNOSIS — M1990 Unspecified osteoarthritis, unspecified site: Secondary | ICD-10-CM | POA: Diagnosis not present

## 2014-06-27 DIAGNOSIS — M25551 Pain in right hip: Secondary | ICD-10-CM

## 2014-06-27 DIAGNOSIS — Z87891 Personal history of nicotine dependence: Secondary | ICD-10-CM | POA: Diagnosis not present

## 2014-06-27 DIAGNOSIS — Z791 Long term (current) use of non-steroidal anti-inflammatories (NSAID): Secondary | ICD-10-CM | POA: Insufficient documentation

## 2014-06-27 DIAGNOSIS — Z79899 Other long term (current) drug therapy: Secondary | ICD-10-CM | POA: Diagnosis not present

## 2014-06-27 DIAGNOSIS — M25559 Pain in unspecified hip: Secondary | ICD-10-CM

## 2014-06-27 MED ORDER — KETOROLAC TROMETHAMINE 60 MG/2ML IM SOLN
30.0000 mg | Freq: Once | INTRAMUSCULAR | Status: AC
  Administered 2014-06-27: 30 mg via INTRAMUSCULAR
  Filled 2014-06-27: qty 2

## 2014-06-27 NOTE — ED Notes (Signed)
Pt reports arthritis in right hip, since January right hip pain has increased, now pain at point where pt must walk with walker since yesterday. Pain 10/10 with ambulation. Denies falls, trauma, or injury.

## 2014-06-27 NOTE — Discharge Instructions (Signed)
Arthritis, Nonspecific °Arthritis is inflammation of a joint. This usually means pain, redness, warmth or swelling are present. One or more joints may be involved. There are a number of types of arthritis. Your caregiver may not be able to tell what type of arthritis you have right away. °CAUSES  °The most common cause of arthritis is the wear and tear on the joint (osteoarthritis). This causes damage to the cartilage, which can break down over time. The knees, hips, back and neck are most often affected by this type of arthritis. °Other types of arthritis and common causes of joint pain include: °· Sprains and other injuries near the joint. Sometimes minor sprains and injuries cause pain and swelling that develop hours later. °· Rheumatoid arthritis. This affects hands, feet and knees. It usually affects both sides of your body at the same time. It is often associated with chronic ailments, fever, weight loss and general weakness. °· Crystal arthritis. Gout and pseudo gout can cause occasional acute severe pain, redness and swelling in the foot, ankle, or knee. °· Infectious arthritis. Bacteria can get into a joint through a break in overlying skin. This can cause infection of the joint. Bacteria and viruses can also spread through the blood and affect your joints. °· Drug, infectious and allergy reactions. Sometimes joints can become mildly painful and slightly swollen with these types of illnesses. °SYMPTOMS  °· Pain is the main symptom. °· Your joint or joints can also be red, swollen and warm or hot to the touch. °· You may have a fever with certain types of arthritis, or even feel overall ill. °· The joint with arthritis will hurt with movement. Stiffness is present with some types of arthritis. °DIAGNOSIS  °Your caregiver will suspect arthritis based on your description of your symptoms and on your exam. Testing may be needed to find the type of arthritis: °· Blood and sometimes urine tests. °· X-ray tests  and sometimes CT or MRI scans. °· Removal of fluid from the joint (arthrocentesis) is done to check for bacteria, crystals or other causes. Your caregiver (or a specialist) will numb the area over the joint with a local anesthetic, and use a needle to remove joint fluid for examination. This procedure is only minimally uncomfortable. °· Even with these tests, your caregiver may not be able to tell what kind of arthritis you have. Consultation with a specialist (rheumatologist) may be helpful. °TREATMENT  °Your caregiver will discuss with you treatment specific to your type of arthritis. If the specific type cannot be determined, then the following general recommendations may apply. °Treatment of severe joint pain includes: °· Rest. °· Elevation. °· Anti-inflammatory medication (for example, ibuprofen) may be prescribed. Avoiding activities that cause increased pain. °· Only take over-the-counter or prescription medicines for pain and discomfort as recommended by your caregiver. °· Cold packs over an inflamed joint may be used for 10 to 15 minutes every hour. Hot packs sometimes feel better, but do not use overnight. Do not use hot packs if you are diabetic without your caregiver's permission. °· A cortisone shot into arthritic joints may help reduce pain and swelling. °· Any acute arthritis that gets worse over the next 1 to 2 days needs to be looked at to be sure there is no joint infection. °Long-term arthritis treatment involves modifying activities and lifestyle to reduce joint stress jarring. This can include weight loss. Also, exercise is needed to nourish the joint cartilage and remove waste. This helps keep the muscles   around the joint strong. °HOME CARE INSTRUCTIONS  °· Do not take aspirin to relieve pain if gout is suspected. This elevates uric acid levels. °· Only take over-the-counter or prescription medicines for pain, discomfort or fever as directed by your caregiver. °· Rest the joint as much as  possible. °· If your joint is swollen, keep it elevated. °· Use crutches if the painful joint is in your leg. °· Drinking plenty of fluids may help for certain types of arthritis. °· Follow your caregiver's dietary instructions. °· Try low-impact exercise such as: °¨ Swimming. °¨ Water aerobics. °¨ Biking. °¨ Walking. °· Morning stiffness is often relieved by a warm shower. °· Put your joints through regular range-of-motion. °SEEK MEDICAL CARE IF:  °· You do not feel better in 24 hours or are getting worse. °· You have side effects to medications, or are not getting better with treatment. °SEEK IMMEDIATE MEDICAL CARE IF:  °· You have a fever. °· You develop severe joint pain, swelling or redness. °· Many joints are involved and become painful and swollen. °· There is severe back pain and/or leg weakness. °· You have loss of bowel or bladder control. °Document Released: 01/29/2004 Document Revised: 03/15/2011 Document Reviewed: 02/14/2008 °[employer]® Patient Information ©2015 [employer]. This information is not intended to replace advice given to you by your health care provider. Make sure you discuss any questions you have with your health care provider. ° °

## 2014-06-27 NOTE — ED Provider Notes (Signed)
CSN: [id number]     Arrival date & time 06/27/14  1107 History   First MD Initiated Contact with Patient 06/27/14 1133     Chief Complaint  Patient presents with  . Hip Pain     (Consider location/radiation/quality/duration/timing/severity/associated sxs/prior Treatment) Patient is a 80 y.o. male presenting with hip pain.  Hip Pain This is a chronic problem. Episode onset: 6 months ago, acutely worsening over last week. The problem occurs constantly. The problem has been gradually worsening. Pertinent negatives include no chest pain, no abdominal pain, no headaches and no shortness of breath. The symptoms are aggravated by walking. Nothing relieves the symptoms. He has tried nothing for the symptoms.    Past Medical History  Diagnosis Date  . Diverticulosis   . Hemorrhoids   . [place] tear 1981  . Arthritis   . Esophageal stricture   . Hiatal hernia   . GERD (gastroesophageal reflux disease)    Past Surgical History  Procedure Laterality Date  . Hemorrhoid surgery  1990  . Umbilical hernia repair    . Cholecystectomy    . Colonoscopy    . Upper gastrointestinal endoscopy    . Gastrocnemius recession  02/03/2011    Procedure: GASTROCNEMIUS SLIDE;  Surgeon: Colin Rhein, MD;  Location: [facility];  Service: Orthopedics;  Laterality: Right;   Family History  Problem Relation Age of Onset  . Colon cancer Maternal Grandmother    History  Substance Use Topics  . Smoking status: Former Smoker    Quit date: 01/29/1980  . Smokeless tobacco: Never Used  . Alcohol Use: Yes     Comment: 2 beers daily    Review of Systems  Respiratory: Negative for shortness of breath.   Cardiovascular: Negative for chest pain.  Gastrointestinal: Negative for abdominal pain.  Neurological: Negative for headaches.  All other systems reviewed and are negative.     Allergies  Review of patient's allergies indicates no known allergies.  Home Medications   Prior to  Admission medications   Medication Sig Start Date End Date Taking? Authorizing Provider  acetaminophen (TYLENOL) 500 MG tablet Take 500-1,000 mg by mouth every 6 (six) hours as needed for mild pain, moderate pain or headache.   Yes Historical Provider, MD  fish oil-omega-3 fatty acids 1000 MG capsule Take 1 g by mouth daily.    Yes Historical Provider, MD  meloxicam (MOBIC) 7.5 MG tablet Take 7.5 mg by mouth daily.   Yes Historical Provider, MD  omeprazole (PRILOSEC) 20 MG capsule Take 20 mg by mouth as needed (heartburn).  11/07/13  Yes Historical Provider, MD   BP 130/71 mmHg  Pulse 65  Temp(Src) 98.5 F (36.9 C) (Oral)  Resp 16  SpO2 100% Physical Exam  Constitutional: He is oriented to person, place, and time. He appears well-developed and well-nourished.  HENT:  Head: Normocephalic and atraumatic.  Eyes: Conjunctivae and EOM are normal.  Neck: Normal range of motion. Neck supple.  Cardiovascular: Normal rate, regular rhythm and normal heart sounds.   Pulmonary/Chest: Effort normal and breath sounds normal. No respiratory distress.  Abdominal: He exhibits no distension. There is no tenderness. There is no rebound and no guarding.  Musculoskeletal: Normal range of motion.       Right hip: He exhibits tenderness and bony tenderness. He exhibits normal range of motion.       Lumbar back: Normal.  Neurological: He is alert and oriented to person, place, and time.  Skin: Skin is  warm and dry.  Vitals reviewed.   ED Course  Procedures (including critical care time) Labs Review Labs Reviewed - No data to display  Imaging Review Dg Hip Unilat  With Pelvis 2-3 Views Right  06/27/2014   CLINICAL DATA:  RIGHT hip pain since January 2016 increased yesterday, uses a walker  EXAM: RIGHT HIP (WITH PELVIS) 2-3 VIEWS  COMPARISON:  None  FINDINGS: Osseous demineralization.  Advanced osteoarthritic changes RIGHT hip with joint space loss, bone-on-bone appearance, subchondral cyst formation in  superolateral subluxation.  Mild associated sclerosis within the RIGHT femoral head with minimal superior flattening.  Cannot completely exclude underlying component of avascular necrosis of the RIGHT femoral head.  Minimal spur formation at RIGHT acetabular margin.  Moderate joint space narrowing LEFT hip joint.  SI joints symmetric.  Bony pelvis intact.  LEFT pelvic phleboliths noted.  Degenerative disc disease changes at visualized lumbar spine.  IMPRESSION: Advanced degenerative changes of both hip joints greater on RIGHT.  The presence of mixed lucency and sclerosis of the RIGHT femoral head with slight flattening superiorly could be related to advanced osteoarthritic changes though underlying avascular necrosis not excluded.   Electronically Signed   By: Lavonia Dana M.D.   On: 06/27/2014 12:19     EKG Interpretation None      MDM   Final diagnoses:  Hip pain, right    80 y.o. male with pertinent PMH of OA, prior back pain presents with atraumatic R hip pain as above.  No systemic symptoms.  Pt has FROM, however it is painful and he now requires a walker to walk.  he has an ortho appoinment @1415  today.  XR obtained and with signs of OA, feel this is most likely, however also discussed strict return precautions for spinal pathology, including cauda equina.  DC home in stable condition after toradol.    I have reviewed all laboratory and imaging studies if ordered as above  1. Hip pain, right   2. Hip pain         Debby Freiberg, MD 06/27/14 1251

## 2014-06-27 NOTE — ED Notes (Signed)
AVS explained in detail. Has orthopedic follow-up at 1415 today. Ambulation steady with a walker. No other c/c.

## 2014-06-27 NOTE — ED Notes (Signed)
Patient transported to X-ray 

## 2014-07-10 ENCOUNTER — Ambulatory Visit: Payer: Self-pay | Admitting: Orthopedic Surgery

## 2014-07-13 ENCOUNTER — Other Ambulatory Visit: Payer: Self-pay | Admitting: Endocrinology

## 2014-08-19 ENCOUNTER — Encounter: Payer: Self-pay | Admitting: Endocrinology

## 2014-08-19 ENCOUNTER — Ambulatory Visit (INDEPENDENT_AMBULATORY_CARE_PROVIDER_SITE_OTHER): Payer: Medicare Other | Admitting: Endocrinology

## 2014-08-19 VITALS — BP 154/88 | HR 64 | Temp 97.7°F | Resp 16 | Ht 72.0 in | Wt 192.8 lb

## 2014-08-19 DIAGNOSIS — N139 Obstructive and reflux uropathy, unspecified: Secondary | ICD-10-CM | POA: Diagnosis not present

## 2014-08-19 MED ORDER — TRAMADOL HCL 50 MG PO TABS: 50.0000 mg | ORAL_TABLET | Freq: Three times a day (TID) | ORAL | Status: DC | PRN

## 2014-08-19 MED ORDER — SILODOSIN 8 MG PO CAPS: 8.0000 mg | ORAL_CAPSULE | Freq: Every day | ORAL | Status: DC

## 2014-08-19 NOTE — Progress Notes (Signed)
Subjective:     Patient ID: Phillip Shelton, male   DOB: 05-19-1934, 80 y.o.   MRN: [id number]  Chief complaint: Urinary frequency and decreased stream  HPI  He has had lower urinary tract symptoms for several years with no specific etiology identified Has seen a urologist also in the past and probably was felt to have BPH  For the last month or so he has been having a slower urinary stream and he has a feeling of incomplete emptying. Also getting up every 1-1/2-2 hours at night to go to the bathroom usually along with every 2 hours during the day May have some hesitancy also No discomfort or burning with urination Currently not on any medications, previously has tried Flomax without much relief   Review of Systems  He apparently has significant osteoarthritis of the hip and is scheduled to have surgery next month He says that Mobic does not help much Currently using Tylenol and this does not control his discomfort including at night Had only a few days relief with the steroid injection     Objective:   Physical Exam  His prostate exam shows mild smooth enlargement, relatively soft and slightly larger on the right lobe.  No tenderness No rectal mass            Lower urinary tract symptoms.  Since he has more slow stream and hesitancy may have more symptoms related to prostate enlargement even though clinically he does not have much posterior lobe enlargement  Osteoarthritis hip with persistent pain, not relieved with Mobic and Tylenol     Plan:    Trial of Rapaflo and he will call if not improved in 1-2 weeks May take tramadol as needed

## 2014-08-22 ENCOUNTER — Encounter: Payer: Self-pay | Admitting: Gastroenterology

## 2014-09-02 ENCOUNTER — Ambulatory Visit: Payer: Self-pay | Admitting: Orthopedic Surgery

## 2014-09-02 NOTE — H&P (Signed)
TOTAL HIP ADMISSION H&P  Patient is admitted for right total hip arthroplasty.  Subjective:  Chief Complaint: right hip pain  HPI: Phillip Shelton, 80 y.o. male, has a history of pain and functional disability in the right hip(s) due to arthritis and patient has failed non-surgical conservative treatments for greater than 12 weeks to include NSAID's and/or analgesics, corticosteriod injections, flexibility and strengthening excercises, use of assistive devices, weight reduction as appropriate and activity modification.  Onset of symptoms was gradual starting 1 years ago with rapidlly worsening course since that time.The patient noted no past surgery on the right hip(s).  Patient currently rates pain in the right hip at 10 out of 10 with activity. Patient has night pain, worsening of pain with activity and weight bearing, pain that interfers with activities of daily living and pain with passive range of motion. Patient has evidence of subchondral cysts, subchondral sclerosis, periarticular osteophytes and joint space narrowing by imaging studies. This condition presents safety issues increasing the risk of falls. There is no current active infection.  Patient Active Problem List   Diagnosis Date Noted  . Idiopathic Charcot's joint of foot 12/27/2012  . Pure hypercholesterolemia 12/27/2012  . OAB (overactive bladder) 12/27/2012  . Hemorrhoids 08/26/2010  . GERD 02/12/2010   Past Medical History  Diagnosis Date  . Diverticulosis   . Hemorrhoids   . [place] tear 1981  . Arthritis   . Esophageal stricture   . Hiatal hernia   . GERD (gastroesophageal reflux disease)     Past Surgical History  Procedure Laterality Date  . Hemorrhoid surgery  1990  . Umbilical hernia repair    . Cholecystectomy    . Colonoscopy    . Upper gastrointestinal endoscopy    . Gastrocnemius recession  02/03/2011    Procedure: GASTROCNEMIUS SLIDE;  Surgeon: Colin Rhein, MD;  Location: [facility];  Service: Orthopedics;  Laterality: Right;     (Not in a hospital admission) No Known Allergies  Social History  Substance Use Topics  . Smoking status: Former Smoker    Quit date: 01/29/1980  . Smokeless tobacco: Never Used  . Alcohol Use: Yes     Comment: 2 beers daily    Family History  Problem Relation Age of Onset  . Colon cancer Maternal Grandmother      Review of Systems  Constitutional: Negative.   HENT: Negative.   Eyes: Negative.   Cardiovascular: Negative.   Gastrointestinal: Negative.   Genitourinary: Positive for urgency and frequency. Negative for dysuria, hematuria and flank pain.  Musculoskeletal: Positive for back pain and joint pain.  Skin: Negative.   Neurological: Negative.   Endo/Heme/Allergies: Negative.   Psychiatric/Behavioral: Negative.     Objective:  Physical Exam  Vitals reviewed. Constitutional: He is oriented to person, place, and time. He appears well-developed and well-nourished.  HENT:  Head: Normocephalic and atraumatic.  Eyes: Conjunctivae are normal. Pupils are equal, round, and reactive to light.  Neck: Normal range of motion. Neck supple.  Cardiovascular: Normal rate, regular rhythm, normal heart sounds and intact distal pulses.   Respiratory: Effort normal and breath sounds normal. No respiratory distress.  GI: Bowel sounds are normal. He exhibits no distension.  Genitourinary:  deferred  Musculoskeletal:       Right hip: He exhibits decreased range of motion and decreased strength.  Neurological: He is alert and oriented to person, place, and time. He has normal reflexes.  Skin: Skin is warm and dry.  Psychiatric: He has a normal mood and affect. His behavior is normal. Judgment and thought content normal.    Vital signs in last 24 hours: @VSRANGES @  Labs:   Estimated body mass index is 26.14 kg/(m^2) as calculated from the following:   Height as of 08/19/14: 6' (1.829 m).   Weight as of 08/19/14: 87.454 kg  (192 lb 12.8 oz).   Imaging Review Plain radiographs demonstrate severe degenerative joint disease of the right hip(s). The bone quality appears to be adequate for age and reported activity level.  Assessment/Plan:  End stage arthritis, right hip(s)  The patient history, physical examination, clinical judgement of the provider and imaging studies are consistent with end stage degenerative joint disease of the right hip(s) and total hip arthroplasty is deemed medically necessary. The treatment options including medical management, injection therapy, arthroscopy and arthroplasty were discussed at length. The risks and benefits of total hip arthroplasty were presented and reviewed. The risks due to aseptic loosening, infection, stiffness, dislocation/subluxation,  thromboembolic complications and other imponderables were discussed.  The patient acknowledged the explanation, agreed to proceed with the plan and consent was signed. Patient is being admitted for inpatient treatment for surgery, pain control, PT, OT, prophylactic antibiotics, VTE prophylaxis, progressive ambulation and ADL's and discharge planning.The patient is planning to be discharged home with home health services

## 2014-09-11 NOTE — Patient Instructions (Addendum)
YOUR PROCEDURE IS SCHEDULED ON :  09/19/14  REPORT TO [facility] MAIN ENTRANCE FOLLOW SIGNS TO EAST ELEVATOR - GO TO 3rd FLOOR CHECK IN AT 3 EAST NURSES STATION (SHORT STAY) AT:  10:45 AM  CALL THIS NUMBER IF YOU HAVE PROBLEMS THE MORNING OF SURGERY [phone]  REMEMBER:ONLY 1 PER PERSON MAY GO TO SHORT STAY WITH YOU TO GET READY THE MORNING OF YOUR SURGERY  DO NOT EAT FOOD  AFTER MIDNIGHT  MAY HAVE CLEAR LIQUIDS UNTIL 6:45 AM  TAKE THESE MEDICINES THE MORNING OF SURGERY: PRILOSEC  CLEAR LIQUID DIET  Foods Allowed                                                                     Foods Excluded  Coffee and tea, regular and decaf                             liquids that you cannot  Plain Jell-O in any flavor                                             see through such as: Fruit ices (not with fruit pulp)                                     milk, soups, orange juice  Iced Popsicles                                                  All solid food Carbonated beverages, regular and diet                                    Cranberry, grape and apple juices Sports drinks like Gatorade Lightly seasoned clear broth or consume(fat free) Sugar, honey syrup ____________________________________________________________________  YOU MAY NOT HAVE ANY METAL ON YOUR BODY INCLUDING HAIR PINS AND PIERCING'S. DO NOT WEAR JEWELRY, MAKEUP, LOTIONS, POWDERS OR PERFUMES. DO NOT WEAR NAIL POLISH. DO NOT SHAVE 48 HRS PRIOR TO SURGERY. MEN MAY SHAVE FACE AND NECK.  DO NOT [place]. [facility] IS NOT RESPONSIBLE FOR VALUABLES.  CONTACTS, DENTURES OR PARTIALS MAY NOT BE WORN TO SURGERY. LEAVE SUITCASE IN CAR. CAN BE BROUGHT TO ROOM AFTER SURGERY.  PATIENTS DISCHARGED THE DAY OF SURGERY WILL NOT BE ALLOWED TO DRIVE HOME.  PLEASE READ OVER THE FOLLOWING INSTRUCTION SHEETS _________________________________________________________________________________                                [facility] - PREPARING FOR SURGERY  Before surgery, you can play an important role.  Because skin is not sterile, your skin needs to be as free of germs as possible.  You can reduce  the number of germs on your skin by washing with CHG (chlorahexidine gluconate) soap before surgery.  CHG is an antiseptic cleaner which kills germs and bonds with the skin to continue killing germs even after washing. Please DO NOT use if you have an allergy to CHG or antibacterial soaps.  If your skin becomes reddened/irritated stop using the CHG and inform your nurse when you arrive at Short Stay. Do not shave (including legs and underarms) for at least 48 hours prior to the first CHG shower.  You may shave your face. Please follow these instructions carefully:   1.  Shower with CHG Soap the night before surgery and the  morning of Surgery.   2.  If you choose to wash your hair, wash your hair first as usual with your  normal  Shampoo.   3.  After you shampoo, rinse your hair and body thoroughly to remove the  shampoo.                                         4.  Use CHG as you would any other liquid soap.  You can apply chg directly  to the skin and wash . Gently wash with scrungie or clean wascloth    5.  Apply the CHG Soap to your body ONLY FROM THE NECK DOWN.   Do not use on open                           Wound or open sores. Avoid contact with eyes, ears mouth and genitals (private parts).                        Genitals (private parts) with your normal soap.              6.  Wash thoroughly, paying special attention to the area where your surgery  will be performed.   7.  Thoroughly rinse your body with warm water from the neck down.   8.  DO NOT shower/wash with your normal soap after using and rinsing off  the CHG Soap .                9.  Pat yourself dry with a clean towel.             10.  Wear clean night clothes to bed after shower             11.  Place clean  sheets on your bed the night of your first shower and do not  sleep with pets.  Day of Surgery : Do not apply any lotions/deodorants the morning of surgery.  Please wear clean clothes to the hospital/surgery center.  FAILURE TO FOLLOW THESE INSTRUCTIONS MAY RESULT IN THE CANCELLATION OF YOUR SURGERY    PATIENT SIGNATURE_________________________________  ______________________________________________________________________     Phillip Shelton  An incentive spirometer is a tool that can help keep your lungs clear and active. This tool measures how well you are filling your lungs with each breath. Taking long deep breaths may help reverse or decrease the chance of developing breathing (pulmonary) problems (especially infection) following:  A long period of time when you are unable to move or be active. BEFORE THE PROCEDURE   If the spirometer includes an indicator to show your best effort, your  nurse or respiratory therapist will set it to a desired goal.  If possible, sit up straight or lean slightly forward. Try not to slouch.  Hold the incentive spirometer in an upright position. INSTRUCTIONS FOR USE   Sit on the edge of your bed if possible, or sit up as far as you can in bed or on a chair.  Hold the incentive spirometer in an upright position.  Breathe out normally.  Place the mouthpiece in your mouth and seal your lips tightly around it.  Breathe in slowly and as deeply as possible, raising the piston or the ball toward the top of the column.  Hold your breath for 3-5 seconds or for as long as possible. Allow the piston or ball to fall to the bottom of the column.  Remove the mouthpiece from your mouth and breathe out normally.  Rest for a few seconds and repeat Steps 1 through 7 at least 10 times every 1-2 hours when you are awake. Take your time and take a few normal breaths between deep breaths.  The spirometer may include an indicator to show your best  effort. Use the indicator as a goal to work toward during each repetition.  After each set of 10 deep breaths, practice coughing to be sure your lungs are clear. If you have an incision (the cut made at the time of surgery), support your incision when coughing by placing a pillow or rolled up towels firmly against it. Once you are able to get out of bed, walk around indoors and cough well. You may stop using the incentive spirometer when instructed by your caregiver.  RISKS AND COMPLICATIONS  Take your time so you do not get dizzy or light-headed.  If you are in pain, you may need to take or ask for pain medication before doing incentive spirometry. It is harder to take a deep breath if you are having pain. AFTER USE  Rest and breathe slowly and easily.  It can be helpful to keep track of a log of your progress. Your caregiver can provide you with a simple table to help with this. If you are using the spirometer at home, follow these instructions: [place] IF:   You are having difficultly using the spirometer.  You have trouble using the spirometer as often as instructed.  Your pain medication is not giving enough relief while using the spirometer.  You develop fever of 100.5 F (38.1 C) or higher. SEEK IMMEDIATE MEDICAL CARE IF:   You cough up bloody sputum that had not been present before.  You develop fever of 102 F (38.9 C) or greater.  You develop worsening pain at or near the incision site. MAKE SURE YOU:   Understand these instructions.  Will watch your condition.  Will get help right away if you are not doing well or get worse. Document Released: 05/03/2006 Document Revised: 03/15/2011 Document Reviewed: 07/04/2006 [employer] Patient Information 2014 [employer], [place].   ________________________________________________________________________  WHAT IS A BLOOD TRANSFUSION? Blood Transfusion Information  A transfusion is the replacement of blood or some of  its parts. Blood is made up of multiple cells which provide different functions.  Red blood cells carry oxygen and are used for blood loss replacement.  White blood cells fight against infection.  Platelets control bleeding.  Plasma helps clot blood.  Other blood products are available for specialized needs, such as hemophilia or other clotting disorders. BEFORE THE TRANSFUSION  Who gives blood for transfusions?  Healthy volunteers who are fully evaluated to make sure their blood is safe. This is blood bank blood. Transfusion therapy is the safest it has ever been in the practice of medicine. Before blood is taken from a donor, a complete history is taken to make sure that person has no history of diseases nor engages in risky social behavior (examples are intravenous drug use or sexual activity with multiple partners). The donor's travel history is screened to minimize risk of transmitting infections, such as malaria. The donated blood is tested for signs of infectious diseases, such as HIV and hepatitis. The blood is then tested to be sure it is compatible with you in order to minimize the chance of a transfusion reaction. If you or a relative donates blood, this is often done in anticipation of surgery and is not appropriate for emergency situations. It takes many days to process the donated blood. RISKS AND COMPLICATIONS Although transfusion therapy is very safe and saves many lives, the main dangers of transfusion include:   Getting an infectious disease.  Developing a transfusion reaction. This is an allergic reaction to something in the blood you were given. Every precaution is taken to prevent this. The decision to have a blood transfusion has been considered carefully by your caregiver before blood is given. Blood is not given unless the benefits outweigh the risks. AFTER THE TRANSFUSION  Right after receiving a blood transfusion, you will usually feel much better and more  energetic. This is especially true if your red blood cells have gotten low (anemic). The transfusion raises the level of the red blood cells which carry oxygen, and this usually causes an energy increase.  The nurse administering the transfusion will monitor you carefully for complications. HOME CARE INSTRUCTIONS  No special instructions are needed after a transfusion. You may find your energy is better. Speak with your caregiver about any limitations on activity for underlying diseases you may have. SEEK MEDICAL CARE IF:   Your condition is not improving after your transfusion.  You develop redness or irritation at the intravenous (IV) site. SEEK IMMEDIATE MEDICAL CARE IF:  Any of the following symptoms occur over the next 12 hours:  Shaking chills.  You have a temperature by mouth above 102 F (38.9 C), not controlled by medicine.  Chest, back, or muscle pain.  People around you feel you are not acting correctly or are confused.  Shortness of breath or difficulty breathing.  Dizziness and fainting.  You get a rash or develop hives.  You have a decrease in urine output.  Your urine turns a dark color or changes to pink, red, or brown. Any of the following symptoms occur over the next 10 days:  You have a temperature by mouth above 102 F (38.9 C), not controlled by medicine.  Shortness of breath.  Weakness after normal activity.  The white part of the eye turns yellow (jaundice).  You have a decrease in the amount of urine or are urinating less often.  Your urine turns a dark color or changes to pink, red, or brown. Document Released: 12/19/1999 Document Revised: 03/15/2011 Document Reviewed: 08/07/2007 [facility] Patient Information 2014 [place], [place].  _______________________________________________________________________

## 2014-09-12 ENCOUNTER — Encounter (HOSPITAL_COMMUNITY): Payer: Self-pay

## 2014-09-12 ENCOUNTER — Encounter (HOSPITAL_COMMUNITY)
Admission: RE | Admit: 2014-09-12 | Discharge: 2014-09-12 | Disposition: A | Discharge status: Home / Self Care | Payer: Medicare Other | Source: Ambulatory Visit | Attending: Orthopedic Surgery | Admitting: Orthopedic Surgery

## 2014-09-12 DIAGNOSIS — M1611 Unilateral primary osteoarthritis, right hip: Secondary | ICD-10-CM | POA: Insufficient documentation

## 2014-09-12 DIAGNOSIS — Z01812 Encounter for preprocedural laboratory examination: Secondary | ICD-10-CM | POA: Insufficient documentation

## 2014-09-12 DIAGNOSIS — Z0183 Encounter for blood typing: Secondary | ICD-10-CM | POA: Diagnosis not present

## 2014-09-12 HISTORY — DX: Other complications of anesthesia, initial encounter: T88.59XA

## 2014-09-12 HISTORY — DX: Nausea with vomiting, unspecified: R11.2

## 2014-09-12 HISTORY — DX: Adverse effect of unspecified anesthetic, initial encounter: T41.45XA

## 2014-09-12 HISTORY — DX: Nausea with vomiting, unspecified: Z98.890

## 2014-09-12 LAB — COMPREHENSIVE METABOLIC PANEL
ALT: 13 U/L — AB (ref 17–63)
AST: 19 U/L (ref 15–41)
Albumin: 4.5 g/dL (ref 3.5–5.0)
Alkaline Phosphatase: 58 U/L (ref 38–126)
Anion gap: 11 (ref 5–15)
BILIRUBIN TOTAL: 1.1 mg/dL (ref 0.3–1.2)
BUN: 19 mg/dL (ref 6–20)
CO2: 26 mmol/L (ref 22–32)
CREATININE: 1 mg/dL (ref 0.61–1.24)
Calcium: 9.9 mg/dL (ref 8.9–10.3)
Chloride: 102 mmol/L (ref 101–111)
GFR calc Af Amer: >60 mL/min (ref >60)
Glucose, Bld: 159 mg/dL — ABNORMAL HIGH (ref 65–99)
Potassium: 4.6 mmol/L (ref 3.5–5.1)
Sodium: 139 mmol/L (ref 135–145)
TOTAL PROTEIN: 7.7 g/dL (ref 6.5–8.1)

## 2014-09-12 LAB — CBC
HEMATOCRIT: 40.3 % (ref 39.0–52.0)
Hemoglobin: 13.6 g/dL (ref 13.0–17.0)
MCH: 31.3 pg (ref 26.0–34.0)
MCHC: 33.7 g/dL (ref 30.0–36.0)
MCV: 92.9 fL (ref 78.0–100.0)
Platelets: 222 10*3/uL (ref 150–400)
RBC: 4.34 MIL/uL (ref 4.22–5.81)
RDW: 13.3 % (ref 11.5–15.5)
WBC: 7.1 10*3/uL (ref 4.0–10.5)

## 2014-09-12 LAB — APTT: aPTT: 30 seconds (ref 24–37)

## 2014-09-12 LAB — PROTIME-INR
INR: 1.04 (ref 0.00–1.49)
PROTHROMBIN TIME: 13.8 s (ref 11.6–15.2)

## 2014-09-12 LAB — SURGICAL PCR SCREEN
MRSA, PCR: NEGATIVE
Staphylococcus aureus: NEGATIVE

## 2014-09-12 LAB — ABO/RH: ABO/RH(D): O POS

## 2014-09-17 ENCOUNTER — Ambulatory Visit: Payer: Self-pay | Admitting: Orthopedic Surgery

## 2014-09-19 ENCOUNTER — Inpatient Hospital Stay (HOSPITAL_COMMUNITY): Payer: Medicare Other

## 2014-09-19 ENCOUNTER — Inpatient Hospital Stay (HOSPITAL_COMMUNITY)
Admission: RE | Admit: 2014-09-19 | Discharge: 2014-09-22 | DRG: 470 | Disposition: A | Discharge status: Home Health | Payer: Medicare Other | Source: Ambulatory Visit | Attending: Orthopedic Surgery | Admitting: Orthopedic Surgery

## 2014-09-19 ENCOUNTER — Encounter (HOSPITAL_COMMUNITY): Payer: Self-pay | Admitting: *Deleted

## 2014-09-19 ENCOUNTER — Inpatient Hospital Stay (HOSPITAL_COMMUNITY): Payer: Medicare Other | Admitting: Anesthesiology

## 2014-09-19 ENCOUNTER — Encounter (HOSPITAL_COMMUNITY)
Admission: RE | Disposition: A | Discharge status: Home Health | Payer: Self-pay | Source: Ambulatory Visit | Attending: Orthopedic Surgery

## 2014-09-19 DIAGNOSIS — Z87891 Personal history of nicotine dependence: Secondary | ICD-10-CM

## 2014-09-19 DIAGNOSIS — M549 Dorsalgia, unspecified: Secondary | ICD-10-CM | POA: Diagnosis present

## 2014-09-19 DIAGNOSIS — D62 Acute posthemorrhagic anemia: Secondary | ICD-10-CM | POA: Diagnosis not present

## 2014-09-19 DIAGNOSIS — M1611 Unilateral primary osteoarthritis, right hip: Principal | ICD-10-CM | POA: Diagnosis present

## 2014-09-19 DIAGNOSIS — Z09 Encounter for follow-up examination after completed treatment for conditions other than malignant neoplasm: Secondary | ICD-10-CM

## 2014-09-19 DIAGNOSIS — K5900 Constipation, unspecified: Secondary | ICD-10-CM | POA: Diagnosis not present

## 2014-09-19 DIAGNOSIS — Z01812 Encounter for preprocedural laboratory examination: Secondary | ICD-10-CM | POA: Diagnosis not present

## 2014-09-19 DIAGNOSIS — Z9049 Acquired absence of other specified parts of digestive tract: Secondary | ICD-10-CM | POA: Diagnosis present

## 2014-09-19 DIAGNOSIS — K219 Gastro-esophageal reflux disease without esophagitis: Secondary | ICD-10-CM | POA: Diagnosis present

## 2014-09-19 DIAGNOSIS — M25551 Pain in right hip: Secondary | ICD-10-CM

## 2014-09-19 DIAGNOSIS — K449 Diaphragmatic hernia without obstruction or gangrene: Secondary | ICD-10-CM | POA: Diagnosis present

## 2014-09-19 HISTORY — PX: TOTAL HIP ARTHROPLASTY: SHX124

## 2014-09-19 HISTORY — DX: Unilateral primary osteoarthritis, right hip: M16.11

## 2014-09-19 LAB — TYPE AND SCREEN
ABO/RH(D): O POS
Antibody Screen: NEGATIVE

## 2014-09-19 SURGERY — ARTHROPLASTY, HIP, TOTAL, ANTERIOR APPROACH
Anesthesia: Spinal | Site: Hip | Laterality: Right

## 2014-09-19 MED ORDER — CHLORHEXIDINE GLUCONATE 4 % EX LIQD: 60.0000 mL | Freq: Once | CUTANEOUS | Status: DC

## 2014-09-19 MED ORDER — SODIUM CHLORIDE 0.9 % IV SOLN
INTRAVENOUS | Status: DC
  Administered 2014-09-19 – 2014-09-22 (×4): via INTRAVENOUS

## 2014-09-19 MED ORDER — PROPOFOL 10 MG/ML IV BOLUS
INTRAVENOUS | Status: AC
  Filled 2014-09-19: qty 20

## 2014-09-19 MED ORDER — MIDAZOLAM HCL 5 MG/5ML IJ SOLN
INTRAMUSCULAR | Status: DC | PRN
  Administered 2014-09-19 (×2): 1 mg via INTRAVENOUS
  Administered 2014-09-19: 2 mg via INTRAVENOUS

## 2014-09-19 MED ORDER — LACTATED RINGERS IV SOLN
INTRAVENOUS | Status: DC
  Administered 2014-09-19: 1000 mL via INTRAVENOUS
  Administered 2014-09-19 (×3): via INTRAVENOUS

## 2014-09-19 MED ORDER — DEXAMETHASONE SODIUM PHOSPHATE 10 MG/ML IJ SOLN
INTRAMUSCULAR | Status: DC | PRN
  Administered 2014-09-19: 10 mg via INTRAVENOUS

## 2014-09-19 MED ORDER — LIDOCAINE HCL 1 % IJ SOLN
INTRAMUSCULAR | Status: AC
  Filled 2014-09-19: qty 20

## 2014-09-19 MED ORDER — BUPIVACAINE-EPINEPHRINE (PF) 0.25% -1:200000 IJ SOLN
INTRAMUSCULAR | Status: AC
  Filled 2014-09-19: qty 30

## 2014-09-19 MED ORDER — FENTANYL CITRATE (PF) 100 MCG/2ML IJ SOLN
INTRAMUSCULAR | Status: DC | PRN
  Administered 2014-09-19 (×4): 50 ug via INTRAVENOUS

## 2014-09-19 MED ORDER — CEFAZOLIN SODIUM-DEXTROSE 2-3 GM-% IV SOLR
INTRAVENOUS | Status: AC
  Filled 2014-09-19: qty 50

## 2014-09-19 MED ORDER — PANTOPRAZOLE SODIUM 40 MG PO TBEC
40.0000 mg | DELAYED_RELEASE_TABLET | Freq: Every day | ORAL | Status: DC
  Administered 2014-09-20 – 2014-09-22 (×3): 40 mg via ORAL
  Filled 2014-09-19 (×4): qty 1

## 2014-09-19 MED ORDER — ONDANSETRON HCL 4 MG PO TABS: 4.0000 mg | ORAL_TABLET | Freq: Four times a day (QID) | ORAL | Status: DC | PRN

## 2014-09-19 MED ORDER — METOCLOPRAMIDE HCL 10 MG PO TABS: 5.0000 mg | ORAL_TABLET | Freq: Three times a day (TID) | ORAL | Status: DC | PRN

## 2014-09-19 MED ORDER — HYDROMORPHONE HCL 1 MG/ML IJ SOLN
INTRAMUSCULAR | Status: AC
  Administered 2014-09-19: 0.25 mg via INTRAVENOUS
  Filled 2014-09-19: qty 1

## 2014-09-19 MED ORDER — BUPIVACAINE HCL (PF) 0.5 % IJ SOLN
INTRAMUSCULAR | Status: AC
  Filled 2014-09-19: qty 30

## 2014-09-19 MED ORDER — ONDANSETRON HCL 4 MG/2ML IJ SOLN
4.0000 mg | Freq: Four times a day (QID) | INTRAMUSCULAR | Status: DC | PRN
  Administered 2014-09-20 – 2014-09-21 (×5): 4 mg via INTRAVENOUS
  Filled 2014-09-19 (×5): qty 2

## 2014-09-19 MED ORDER — HYDROMORPHONE HCL 1 MG/ML IJ SOLN
0.5000 mg | INTRAMUSCULAR | Status: DC | PRN
  Administered 2014-09-20 – 2014-09-21 (×3): 0.5 mg via INTRAVENOUS
  Filled 2014-09-19 (×4): qty 1

## 2014-09-19 MED ORDER — FENTANYL CITRATE (PF) 100 MCG/2ML IJ SOLN
INTRAMUSCULAR | Status: AC
  Filled 2014-09-19: qty 4

## 2014-09-19 MED ORDER — CEFAZOLIN SODIUM-DEXTROSE 2-3 GM-% IV SOLR
2.0000 g | INTRAVENOUS | Status: AC
  Administered 2014-09-19: 2 g via INTRAVENOUS

## 2014-09-19 MED ORDER — PHENYLEPHRINE HCL 10 MG/ML IJ SOLN
INTRAMUSCULAR | Status: DC | PRN
  Administered 2014-09-19 (×3): 80 ug via INTRAVENOUS

## 2014-09-19 MED ORDER — ONDANSETRON HCL 4 MG/2ML IJ SOLN
INTRAMUSCULAR | Status: AC
  Filled 2014-09-19: qty 2

## 2014-09-19 MED ORDER — MENTHOL 3 MG MT LOZG
1.0000 | LOZENGE | OROMUCOSAL | Status: DC | PRN
  Filled 2014-09-19: qty 9

## 2014-09-19 MED ORDER — ISOPROPYL ALCOHOL 70 % SOLN
Status: AC
  Filled 2014-09-19: qty 480

## 2014-09-19 MED ORDER — KETOROLAC TROMETHAMINE 15 MG/ML IJ SOLN
INTRAMUSCULAR | Status: AC
  Filled 2014-09-19: qty 1

## 2014-09-19 MED ORDER — METOCLOPRAMIDE HCL 5 MG/ML IJ SOLN
5.0000 mg | Freq: Three times a day (TID) | INTRAMUSCULAR | Status: DC | PRN
  Administered 2014-09-21 (×2): 10 mg via INTRAVENOUS
  Filled 2014-09-19 (×2): qty 2

## 2014-09-19 MED ORDER — PHENYLEPHRINE HCL 10 MG/ML IJ SOLN
INTRAMUSCULAR | Status: AC
  Filled 2014-09-19: qty 1

## 2014-09-19 MED ORDER — ISOPROPYL ALCOHOL 70 % SOLN
Status: DC | PRN
  Administered 2014-09-19: 1 via TOPICAL

## 2014-09-19 MED ORDER — LACTATED RINGERS IV SOLN: INTRAVENOUS | Status: DC

## 2014-09-19 MED ORDER — HYDROGEN PEROXIDE 3 % EX SOLN
CUTANEOUS | Status: DC | PRN
  Administered 2014-09-19: 1

## 2014-09-19 MED ORDER — PROPOFOL INFUSION 10 MG/ML OPTIME
INTRAVENOUS | Status: DC | PRN
  Administered 2014-09-19: 75 ug/kg/min via INTRAVENOUS

## 2014-09-19 MED ORDER — PHENOL 1.4 % MT LIQD
1.0000 | OROMUCOSAL | Status: DC | PRN
  Filled 2014-09-19: qty 177

## 2014-09-19 MED ORDER — SODIUM CHLORIDE 0.9 % IV SOLN: INTRAVENOUS | Status: DC

## 2014-09-19 MED ORDER — ACETAMINOPHEN 10 MG/ML IV SOLN
1000.0000 mg | Freq: Once | INTRAVENOUS | Status: AC
  Administered 2014-09-19: 1000 mg via INTRAVENOUS

## 2014-09-19 MED ORDER — KETOROLAC TROMETHAMINE 30 MG/ML IJ SOLN
INTRAMUSCULAR | Status: DC | PRN
  Administered 2014-09-19: 30 mg

## 2014-09-19 MED ORDER — LIDOCAINE HCL (CARDIAC) 20 MG/ML IV SOLN
INTRAVENOUS | Status: AC
  Filled 2014-09-19: qty 5

## 2014-09-19 MED ORDER — ASPIRIN EC 325 MG PO TBEC
325.0000 mg | DELAYED_RELEASE_TABLET | Freq: Two times a day (BID) | ORAL | Status: DC
  Administered 2014-09-20 – 2014-09-22 (×5): 325 mg via ORAL
  Filled 2014-09-19 (×7): qty 1

## 2014-09-19 MED ORDER — TRANEXAMIC ACID 1000 MG/10ML IV SOLN
1000.0000 mg | INTRAVENOUS | Status: AC
  Administered 2014-09-19: 1000 mg via INTRAVENOUS
  Filled 2014-09-19: qty 10

## 2014-09-19 MED ORDER — CEFAZOLIN SODIUM-DEXTROSE 2-3 GM-% IV SOLR
2.0000 g | Freq: Four times a day (QID) | INTRAVENOUS | Status: AC
  Administered 2014-09-19 – 2014-09-20 (×2): 2 g via INTRAVENOUS
  Filled 2014-09-19 (×2): qty 50

## 2014-09-19 MED ORDER — SENNA 8.6 MG PO TABS
2.0000 | ORAL_TABLET | Freq: Every day | ORAL | Status: DC
  Administered 2014-09-19 – 2014-09-21 (×3): 17.2 mg via ORAL

## 2014-09-19 MED ORDER — MIDAZOLAM HCL 2 MG/2ML IJ SOLN
INTRAMUSCULAR | Status: AC
  Filled 2014-09-19: qty 4

## 2014-09-19 MED ORDER — DEXAMETHASONE SODIUM PHOSPHATE 10 MG/ML IJ SOLN
INTRAMUSCULAR | Status: AC
  Filled 2014-09-19: qty 1

## 2014-09-19 MED ORDER — LIDOCAINE HCL (CARDIAC) 20 MG/ML IV SOLN
INTRAVENOUS | Status: DC | PRN
  Administered 2014-09-19: 50 mg via INTRAVENOUS

## 2014-09-19 MED ORDER — ACETAMINOPHEN 650 MG RE SUPP
650.0000 mg | Freq: Four times a day (QID) | RECTAL | Status: DC | PRN
Start: 2014-09-19 — End: 2014-09-22

## 2014-09-19 MED ORDER — HYDROMORPHONE HCL 1 MG/ML IJ SOLN
0.2500 mg | INTRAMUSCULAR | Status: DC | PRN
  Administered 2014-09-19 (×5): 0.25 mg via INTRAVENOUS

## 2014-09-19 MED ORDER — ACETAMINOPHEN 325 MG PO TABS: 650.0000 mg | ORAL_TABLET | Freq: Four times a day (QID) | ORAL | Status: DC | PRN

## 2014-09-19 MED ORDER — BUPIVACAINE HCL (PF) 0.5 % IJ SOLN
INTRAMUSCULAR | Status: DC | PRN
  Administered 2014-09-19: 3 mL

## 2014-09-19 MED ORDER — SODIUM CHLORIDE 0.9 % IR SOLN
Status: DC | PRN
  Administered 2014-09-19: 1000 mL

## 2014-09-19 MED ORDER — KETOROLAC TROMETHAMINE 15 MG/ML IJ SOLN
7.5000 mg | Freq: Four times a day (QID) | INTRAMUSCULAR | Status: AC
  Administered 2014-09-19 – 2014-09-20 (×3): 7.5 mg via INTRAVENOUS
  Filled 2014-09-19 (×3): qty 1

## 2014-09-19 MED ORDER — DEXAMETHASONE SODIUM PHOSPHATE 10 MG/ML IJ SOLN
10.0000 mg | Freq: Once | INTRAMUSCULAR | Status: AC
  Administered 2014-09-20: 10 mg via INTRAVENOUS
  Filled 2014-09-19: qty 1

## 2014-09-19 MED ORDER — ACETAMINOPHEN 10 MG/ML IV SOLN
INTRAVENOUS | Status: AC
  Filled 2014-09-19: qty 100

## 2014-09-19 MED ORDER — DOCUSATE SODIUM 100 MG PO CAPS
100.0000 mg | ORAL_CAPSULE | Freq: Two times a day (BID) | ORAL | Status: DC
  Administered 2014-09-19 – 2014-09-22 (×6): 100 mg via ORAL

## 2014-09-19 MED ORDER — PHENYLEPHRINE HCL 10 MG/ML IJ SOLN
10.0000 mg | INTRAVENOUS | Status: DC | PRN
  Administered 2014-09-19: 10 ug/min via INTRAVENOUS

## 2014-09-19 MED ORDER — HYDROCODONE-ACETAMINOPHEN 5-325 MG PO TABS
1.0000 | ORAL_TABLET | ORAL | Status: DC | PRN
  Administered 2014-09-19: 1 via ORAL
  Administered 2014-09-20 (×4): 2 via ORAL
  Administered 2014-09-21: 1 via ORAL
  Administered 2014-09-21 – 2014-09-22 (×4): 2 via ORAL
  Filled 2014-09-19 (×3): qty 2
  Filled 2014-09-19: qty 1
  Filled 2014-09-19 (×2): qty 2
  Filled 2014-09-19 (×2): qty 1
  Filled 2014-09-19 (×3): qty 2

## 2014-09-19 MED ORDER — ONDANSETRON HCL 4 MG/2ML IJ SOLN
INTRAMUSCULAR | Status: DC | PRN
  Administered 2014-09-19: 4 mg via INTRAVENOUS

## 2014-09-19 MED ORDER — TAMSULOSIN HCL 0.4 MG PO CAPS
0.4000 mg | ORAL_CAPSULE | Freq: Every day | ORAL | Status: DC
Start: 2014-09-19 — End: 2014-09-22
  Administered 2014-09-19 – 2014-09-22 (×4): 0.4 mg via ORAL
  Filled 2014-09-19 (×4): qty 1

## 2014-09-19 MED ORDER — KETOROLAC TROMETHAMINE 30 MG/ML IJ SOLN
INTRAMUSCULAR | Status: AC
  Filled 2014-09-19: qty 1

## 2014-09-19 MED ORDER — SODIUM CHLORIDE 0.9 % IJ SOLN
INTRAMUSCULAR | Status: DC | PRN
  Administered 2014-09-19: 30 mL

## 2014-09-19 MED ORDER — PHENYLEPHRINE 40 MCG/ML (10ML) SYRINGE FOR IV PUSH (FOR BLOOD PRESSURE SUPPORT)
PREFILLED_SYRINGE | INTRAVENOUS | Status: AC
  Filled 2014-09-19: qty 10

## 2014-09-19 MED ORDER — HYDROGEN PEROXIDE 3 % EX SOLN
CUTANEOUS | Status: AC
  Filled 2014-09-19: qty 473

## 2014-09-19 MED ORDER — SODIUM CHLORIDE 0.9 % IJ SOLN
INTRAMUSCULAR | Status: AC
  Filled 2014-09-19: qty 50

## 2014-09-19 MED ORDER — HYDROMORPHONE HCL 1 MG/ML IJ SOLN
INTRAMUSCULAR | Status: AC
  Filled 2014-09-19: qty 1

## 2014-09-19 SURGICAL SUPPLY — 50 items
BAG DECANTER FOR FLEXI CONT (MISCELLANEOUS) IMPLANT
BAG SPEC THK2 15X12 ZIP CLS (MISCELLANEOUS)
BAG ZIPLOCK 12X15 (MISCELLANEOUS) IMPLANT
CAPT HIP TOTAL 2 ×2 IMPLANT
CHLORAPREP W/TINT 26ML (MISCELLANEOUS) ×3 IMPLANT
COVER PERINEAL POST (MISCELLANEOUS) ×3 IMPLANT
DECANTER SPIKE VIAL GLASS SM (MISCELLANEOUS) ×3 IMPLANT
DRAPE C-ARM 42X120 X-RAY (DRAPES) ×3 IMPLANT
DRAPE LG THREE QUARTER DISP (DRAPES) ×6 IMPLANT
DRAPE STERI IOBAN 125X83 (DRAPES) ×3 IMPLANT
DRAPE U-SHAPE 47X51 STRL (DRAPES) ×9 IMPLANT
DRSG AQUACEL AG ADV 3.5X10 (GAUZE/BANDAGES/DRESSINGS) ×3 IMPLANT
ELECT BLADE TIP CTD 4 INCH (ELECTRODE) ×3 IMPLANT
ELECT PENCIL ROCKER SW 15FT (MISCELLANEOUS) ×3 IMPLANT
ELECT REM PT RETURN 15FT ADLT (MISCELLANEOUS) ×3 IMPLANT
FACESHIELD WRAPAROUND (MASK) ×6 IMPLANT
FACESHIELD WRAPAROUND OR TEAM (MASK) ×2 IMPLANT
GAUZE SPONGE 4X4 12PLY STRL (GAUZE/BANDAGES/DRESSINGS) ×3 IMPLANT
GLOVE BIO SURGEON STRL SZ8.5 (GLOVE) ×6 IMPLANT
GLOVE BIOGEL PI IND STRL 8.5 (GLOVE) ×1 IMPLANT
GLOVE BIOGEL PI INDICATOR 8.5 (GLOVE) ×2
GOWN SPEC L3 XXLG W/TWL (GOWN DISPOSABLE) ×3 IMPLANT
HANDPIECE INTERPULSE COAX TIP (DISPOSABLE) ×3
HOLDER FOLEY CATH W/STRAP (MISCELLANEOUS) ×3 IMPLANT
HOOD PEEL AWAY FACE SHEILD DIS (HOOD) ×6 IMPLANT
KIT BASIN OR (CUSTOM PROCEDURE TRAY) ×3 IMPLANT
LIQUID BAND (GAUZE/BANDAGES/DRESSINGS) ×6 IMPLANT
NDL SPNL 18GX3.5 QUINCKE PK (NEEDLE) ×1 IMPLANT
NEEDLE SPNL 18GX3.5 QUINCKE PK (NEEDLE) ×3 IMPLANT
PACK TOTAL JOINT (CUSTOM PROCEDURE TRAY) ×3 IMPLANT
PEN SKIN MARKING BROAD (MISCELLANEOUS) ×3 IMPLANT
SAW OSC TIP CART 19.5X105X1.3 (SAW) ×3 IMPLANT
SEALER BIPOLAR AQUA 6.0 (INSTRUMENTS) ×3 IMPLANT
SET HNDPC FAN SPRY TIP SCT (DISPOSABLE) ×1 IMPLANT
SOL PREP POV-IOD 4OZ 10% (MISCELLANEOUS) ×3 IMPLANT
SUT ETHIBOND NAB CT1 #1 30IN (SUTURE) ×6 IMPLANT
SUT MNCRL AB 3-0 PS2 18 (SUTURE) ×3 IMPLANT
SUT MON AB 2-0 CT1 36 (SUTURE) ×6 IMPLANT
SUT VIC AB 1 CT1 36 (SUTURE) ×3 IMPLANT
SUT VIC AB 2-0 CT1 27 (SUTURE) ×6
SUT VIC AB 2-0 CT1 TAPERPNT 27 (SUTURE) ×1 IMPLANT
SUT VLOC 180 0 24IN GS25 (SUTURE) ×3 IMPLANT
SYR 50ML LL SCALE MARK (SYRINGE) ×3 IMPLANT
TIP HIGH FLOW IRRIGATION COAX (MISCELLANEOUS) ×2 IMPLANT
TOWEL OR 17X26 10 PK STRL BLUE (TOWEL DISPOSABLE) ×3 IMPLANT
TOWEL OR NON WOVEN STRL DISP B (DISPOSABLE) ×3 IMPLANT
TRAY FOLEY W/METER SILVER 14FR (SET/KITS/TRAYS/PACK) ×1 IMPLANT
TRAY FOLEY W/METER SILVER 16FR (SET/KITS/TRAYS/PACK) ×3 IMPLANT
WATER STERILE IRR 1500ML POUR (IV SOLUTION) ×3 IMPLANT
YANKAUER SUCT BULB TIP 10FT TU (MISCELLANEOUS) ×3 IMPLANT

## 2014-09-19 NOTE — H&P (View-Only) (Signed)
TOTAL HIP ADMISSION H&P  Patient is admitted for right total hip arthroplasty.  Subjective:  Chief Complaint: right hip pain  HPI: Phillip Shelton, 80 y.o. male, has a history of pain and functional disability in the right hip(s) due to arthritis and patient has failed non-surgical conservative treatments for greater than 12 weeks to include NSAID's and/or analgesics, corticosteriod injections, flexibility and strengthening excercises, use of assistive devices, weight reduction as appropriate and activity modification.  Onset of symptoms was gradual starting 1 years ago with rapidlly worsening course since that time.The patient noted no past surgery on the right hip(s).  Patient currently rates pain in the right hip at 10 out of 10 with activity. Patient has night pain, worsening of pain with activity and weight bearing, pain that interfers with activities of daily living and pain with passive range of motion. Patient has evidence of subchondral cysts, subchondral sclerosis, periarticular osteophytes and joint space narrowing by imaging studies. This condition presents safety issues increasing the risk of falls. There is no current active infection.  Patient Active Problem List   Diagnosis Date Noted  . Idiopathic Charcot's joint of foot 12/27/2012  . Pure hypercholesterolemia 12/27/2012  . OAB (overactive bladder) 12/27/2012  . Hemorrhoids 08/26/2010  . GERD 02/12/2010   Past Medical History  Diagnosis Date  . Diverticulosis   . Hemorrhoids   . [place] tear 1981  . Arthritis   . Esophageal stricture   . Hiatal hernia   . GERD (gastroesophageal reflux disease)     Past Surgical History  Procedure Laterality Date  . Hemorrhoid surgery  1990  . Umbilical hernia repair    . Cholecystectomy    . Colonoscopy    . Upper gastrointestinal endoscopy    . Gastrocnemius recession  02/03/2011    Procedure: GASTROCNEMIUS SLIDE;  Surgeon: Colin Rhein, MD;  Location: [facility];  Service: Orthopedics;  Laterality: Right;     (Not in a hospital admission) No Known Allergies  Social History  Substance Use Topics  . Smoking status: Former Smoker    Quit date: 01/29/1980  . Smokeless tobacco: Never Used  . Alcohol Use: Yes     Comment: 2 beers daily    Family History  Problem Relation Age of Onset  . Colon cancer Maternal Grandmother      Review of Systems  Constitutional: Negative.   HENT: Negative.   Eyes: Negative.   Cardiovascular: Negative.   Gastrointestinal: Negative.   Genitourinary: Positive for urgency and frequency. Negative for dysuria, hematuria and flank pain.  Musculoskeletal: Positive for back pain and joint pain.  Skin: Negative.   Neurological: Negative.   Endo/Heme/Allergies: Negative.   Psychiatric/Behavioral: Negative.     Objective:  Physical Exam  Vitals reviewed. Constitutional: He is oriented to person, place, and time. He appears well-developed and well-nourished.  HENT:  Head: Normocephalic and atraumatic.  Eyes: Conjunctivae are normal. Pupils are equal, round, and reactive to light.  Neck: Normal range of motion. Neck supple.  Cardiovascular: Normal rate, regular rhythm, normal heart sounds and intact distal pulses.   Respiratory: Effort normal and breath sounds normal. No respiratory distress.  GI: Bowel sounds are normal. He exhibits no distension.  Genitourinary:  deferred  Musculoskeletal:       Right hip: He exhibits decreased range of motion and decreased strength.  Neurological: He is alert and oriented to person, place, and time. He has normal reflexes.  Skin: Skin is warm and dry.  Psychiatric: He has a normal mood and affect. His behavior is normal. Judgment and thought content normal.    Vital signs in last 24 hours: @VSRANGES @  Labs:   Estimated body mass index is 26.14 kg/(m^2) as calculated from the following:   Height as of 08/19/14: 6' (1.829 m).   Weight as of 08/19/14: 87.454 kg  (192 lb 12.8 oz).   Imaging Review Plain radiographs demonstrate severe degenerative joint disease of the right hip(s). The bone quality appears to be adequate for age and reported activity level.  Assessment/Plan:  End stage arthritis, right hip(s)  The patient history, physical examination, clinical judgement of the provider and imaging studies are consistent with end stage degenerative joint disease of the right hip(s) and total hip arthroplasty is deemed medically necessary. The treatment options including medical management, injection therapy, arthroscopy and arthroplasty were discussed at length. The risks and benefits of total hip arthroplasty were presented and reviewed. The risks due to aseptic loosening, infection, stiffness, dislocation/subluxation,  thromboembolic complications and other imponderables were discussed.  The patient acknowledged the explanation, agreed to proceed with the plan and consent was signed. Patient is being admitted for inpatient treatment for surgery, pain control, PT, OT, prophylactic antibiotics, VTE prophylaxis, progressive ambulation and ADL's and discharge planning.The patient is planning to be discharged home with home health services

## 2014-09-19 NOTE — Discharge Instructions (Signed)
°Dr. Crescent Gotham °Joint Replacement Specialist °[facility] °[address] °[place], [place] [address] °[phone] ° ° °TOTAL HIP REPLACEMENT POSTOPERATIVE DIRECTIONS ° ° ° °Hip Rehabilitation, Guidelines Following Surgery  ° °WEIGHT BEARING °Partial weight bearing with assist device as directed.  50%  ° °The results of a hip operation are greatly improved after range of motion and muscle strengthening exercises. Follow all safety measures which are given to protect your hip. If any of these exercises cause increased pain or swelling in your joint, decrease the amount until you are comfortable again. Then slowly increase the exercises. Call your caregiver if you have problems or questions.  ° °HOME CARE INSTRUCTIONS  °Most of the following instructions are designed to prevent the dislocation of your new hip.  °Remove items at home which could result in a fall. This includes throw rugs or furniture in walking pathways.  °Continue medications as instructed at time of discharge. °· You may have some home medications which will be placed on hold until you complete the course of blood thinner medication. °· You may start showering once you are discharged home. Do not remove your dressing. °Do not put on socks or shoes without following the instructions of your caregivers.   °Sit on chairs with arms. Use the chair arms to help push yourself up when arising.  °Arrange for the use of a toilet seat elevator so you are not sitting low.  °· Walk with walker as instructed.  °You may resume a sexual relationship in one month or when given the OK by your caregiver.  °Use walker as long as suggested by your caregivers.  °Avoid periods of inactivity such as sitting longer than an hour when not asleep. This helps prevent blood clots.  °You may return to work once you are cleared by your surgeon.  °Do not drive a car for 6 weeks or until released by your surgeon.  °Do not drive while taking narcotics.    °Wear elastic stockings for two weeks following surgery during the day but you may remove then at night.  °Make sure you keep all of your appointments after your operation with all of your doctors and caregivers. You should call the office at the above phone number and make an appointment for approximately two weeks after the date of your surgery. °Please pick up a stool softener and laxative for home use as long as you are requiring pain medications. °· ICE to the affected hip every three hours for 30 minutes at a time and then as needed for pain and swelling. Continue to use ice on the hip for pain and swelling from surgery. You may notice swelling that will progress down to the foot and ankle.  This is normal after surgery.  Elevate the leg when you are not up walking on it.   °It is important for you to complete the blood thinner medication as prescribed by your doctor. °· Continue to use the breathing machine which will help keep your temperature down.  It is common for your temperature to cycle up and down following surgery, especially at night when you are not up moving around and exerting yourself.  The breathing machine keeps your lungs expanded and your temperature down. ° °RANGE OF MOTION AND STRENGTHENING EXERCISES  °These exercises are designed to help you keep full movement of your hip joint. Follow your caregiver's or physical therapist's instructions. Perform all exercises about fifteen times, three times per day or as directed.   Exercise both hips, even if you have had only one joint replacement. These exercises can be done on a training (exercise) mat, on the floor, on a table or on a bed. Use whatever works the best and is most comfortable for you. Use music or television while you are exercising so that the exercises are a pleasant break in your day. This will make your life better with the exercises acting as a break in routine you can look forward to.  °Lying on your back, slowly slide your  foot toward your buttocks, raising your knee up off the floor. Then slowly slide your foot back down until your leg is straight again.  °Lying on your back spread your legs as far apart as you can without causing discomfort.  °Lying on your side, raise your upper leg and foot straight up from the floor as far as is comfortable. Slowly lower the leg and repeat.  °Lying on your back, tighten up the muscle in the front of your thigh (quadriceps muscles). You can do this by keeping your leg straight and trying to raise your heel off the floor. This helps strengthen the largest muscle supporting your knee.  °Lying on your back, tighten up the muscles of your buttocks both with the legs straight and with the knee bent at a comfortable angle while keeping your heel on the floor.  ° °SKILLED REHAB INSTRUCTIONS: °If the patient is transferred to a skilled rehab facility following release from the hospital, a list of the current medications will be sent to the facility for the patient to continue.  When discharged from the skilled rehab facility, please have the facility set up the patient's Home Health Physical Therapy prior to being released. Also, the skilled facility will be responsible for providing the patient with their medications at time of release from the facility to include their pain medication and their blood thinner medication. If the patient is still at the rehab facility at time of the two week follow up appointment, the skilled rehab facility will also need to assist the patient in arranging follow up appointment in our office and any transportation needs. ° °MAKE SURE YOU:  °Understand these instructions.  °Will watch your condition.  °Will get help right away if you are not doing well or get worse. ° °Pick up stool softner and laxative for home use following surgery while on pain medications. °Do not remove your dressing. °The dressing is waterproof--it is OK to take showers. °Continue to use ice for pain  and swelling after surgery. °Do not use any lotions or creams on the incision until instructed by your surgeon. °Total Hip Protocol. ° ° °

## 2014-09-19 NOTE — Op Note (Signed)
OPERATIVE REPORT  SURGEON: Rod Can, MD   ASSISTANT: Jennette Bill, PA-C.  PREOPERATIVE DIAGNOSIS: Right hip arthritis.   POSTOPERATIVE DIAGNOSIS: Right hip arthritis.   PROCEDURE: Right total hip arthroplasty, anterior approach.   IMPLANTS: DePuy Tri Lock stem, size 7, hi offset. DePuy Pinnacle Cup, size 62 mm. DePuy Altrx liner, size 62 by 36 mm, +4 neutral. DePuy Biolox ceramic head ball, size 36 + 8.5 mm. 6.5 mm cancellous bone screw x2.  ANESTHESIA:  Spinal  ESTIMATED BLOOD LOSS:-* No blood loss amount entered *    ANTIBIOTICS: 2g ancef.  DRAINS: None.  COMPLICATIONS: None.   CONDITION: PACU - hemodynamically stable.[name]   BRIEF CLINICAL NOTE: Phillip Shelton is a 80 y.o. male with a long-standing history of Right hip arthritis. After failing conservative management, the patient was indicated for total hip arthroplasty. The risks, benefits, and alternatives to the procedure were explained, and the patient elected to proceed.  PROCEDURE IN DETAIL: Surgical site was marked by myself. Spinal anesthesia was obtained in the pre-op holding area. Once inside the operative room, a foley catheter was inserted. The patient was then positioned on the Hana table. All bony prominences were well padded. The hip was prepped and draped in the normal sterile surgical fashion. A time-out was called verifying side and site of surgery. The patient received IV antibiotics within 60 minutes of beginning the procedure.  The direct anterior approach to the hip was performed through the Hueter interval. Lateral femoral circumflex vessels were treated with the Auqumantys. The anterior capsule was exposed and an inverted T capsulotomy was made.The femoral neck cut was made to the level of the templated cut. A corkscrew was placed into the head and the head was removed. The femoral head was found to have eburnated bone and deformity. The head was passed to the back table and was  measured.  Acetabular exposure was achieved, and the pulvinar and labrum were excised. Sequental reaming of the acetabulum was then performed up to a size 61 mm reamer, taking care to keep the hip center in the anatomic position. A 62 mm cup was then opened and impacted into place at approximately 40 degrees of abduction and 20 degrees of anteversion. The cup had excellent stability, and I chose to augment the fixation with two bone screws. The final polyethylene liner was impacted into place and acetabular osteophytes were removed.   I then gained femoral exposure taking care to protect the abductors and greater trochanter. This was performed using standard external rotation, extension, and adduction. The capsule was peeled off the inner aspect of the greater trochanter, taking care to preserve the short external rotators. A cookie cutter was used to enter the femoral canal, and then the femoral canal finder was placed. Sequential broaching was performed up to a size 7. Calcar planer was used on the femoral neck remnant. I paced a hi offset neck and a trial head ball. The hip was reduced. Leg lengths and offset were checked fluoroscopically. The hip was dislocated and trial components were removed. The final implants were placed, and the hip was reduced.  Fluoroscopy was used to confirm component position and leg lengths. At 90 degrees of external rotation and full extension, the hip was stable to an anterior directed force.  The wound was copiously irrigated with a dilute betadine solution followed by normal saline. Marcaine solution was injected into the periarticular soft tissue. The wound was closed in layers using #1 Vicryl and V-Loc for the fascia, 2-0  Vicryl for the subcutaneous fat, 2-0 Monocryl for the deep dermal layer, 3-0 running Monocryl subcuticular stitch, and Dermabond for the skin. Once the glue was fully dried, an Aquacell Ag dressing was applied. The patient was transported  to the recovery room in stable condition. Sponge, needle, and instrument counts were correct at the end of the case x2. The patient tolerated the procedure well and there were no known complications.  Please note that a surgical assistant was a medical necessity for this procedure to perform it in a safe and expeditious manner. Assistant was necessary to provide appropriate retraction of vital neurovascular structures, to prevent femoral fracture, and to allow for anatomic placement of the prosthesis.

## 2014-09-19 NOTE — Plan of Care (Signed)
Problem: Consults Goal: Diagnosis- Total Joint Replacement Primary Total Hip     

## 2014-09-19 NOTE — Interval H&P Note (Signed)
History and Physical Interval Note:  09/19/2014 12:38 PM  Phillip Shelton  has presented today for surgery, with the diagnosis of osteoarthritis right hip  The various methods of treatment have been discussed with the patient and family. After consideration of risks, benefits and other options for treatment, the patient has consented to  Procedure(s): RIGHT TOTAL HIP ARTHROPLASTY ANTERIOR APPROACH (Right) as a surgical intervention .  The patient's history has been reviewed, patient examined, no change in status, stable for surgery.  I have reviewed the patient's chart and labs.  Questions were answered to the patient's satisfaction.     Donaciano Range, Horald Pollen

## 2014-09-19 NOTE — Anesthesia Procedure Notes (Signed)
Spinal Patient location during procedure: OR End time: 09/19/2014 12:55 PM Staffing Resident/CRNA: Noralyn Pick D Performed by: anesthesiologist and resident/CRNA  Preanesthetic Checklist Completed: patient identified, site marked, surgical consent, pre-op evaluation, timeout performed, IV checked, risks and benefits discussed and monitors and equipment checked Spinal Block Patient position: sitting Prep: Betadine Patient monitoring: heart rate, continuous pulse ox and blood pressure Approach: midline Location: L2-3 Injection technique: single-shot Needle Needle type: Spinocan  Needle gauge: 22 G Needle length: 9 cm Assessment Sensory level: T6 Additional Notes Expiration date of kit checked and confirmed. Patient tolerated procedure well, without complications.

## 2014-09-19 NOTE — Transfer of Care (Signed)
Immediate Anesthesia Transfer of Care Note  Patient: Phillip Shelton  Procedure(s) Performed: Procedure(s): RIGHT TOTAL HIP ARTHROPLASTY ANTERIOR APPROACH (Right)  Patient Location: PACU  Anesthesia Type:Spinal  Level of Consciousness: awake, pateint uncooperative, confused, lethargic and responds to stimulation  Airway & Oxygen Therapy: Patient Spontanous Breathing and Patient connected to face mask oxygen  Post-op Assessment: Report given to RN, Post -op Vital signs reviewed and stable and Patient moving all extremities  Post vital signs: Reviewed and stable  Last Vitals:  Filed Vitals:   09/19/14 1043  BP: 149/68  Pulse: 70  Temp: 36.4 C  Resp: 16    Complications: No apparent anesthesia complications

## 2014-09-19 NOTE — Anesthesia Preprocedure Evaluation (Signed)
Anesthesia Evaluation  Patient identified by MRN, date of birth, ID band Patient awake    Reviewed: Allergy & Precautions, H&P , NPO status , Patient's Chart, lab work & pertinent test results  History of Anesthesia Complications (+) PONV  Airway Mallampati: II  TM Distance: >3 FB Neck ROM: full    Dental no notable dental hx. (+) Dental Advisory Given, Teeth Intact   Pulmonary neg pulmonary ROS, former smoker,    Pulmonary exam normal breath sounds clear to auscultation       Cardiovascular Exercise Tolerance: Good negative cardio ROS Normal cardiovascular exam Rhythm:regular Rate:Normal     Neuro/Psych  Neuromuscular disease negative neurological ROS  negative psych ROS   GI/Hepatic negative GI ROS, Neg liver ROS, hiatal hernia, GERD  Medicated and Controlled,Mallory Weiss tear.  Esophageal stricture   Endo/Other  negative endocrine ROS  Renal/GU negative Renal ROS  negative genitourinary   Musculoskeletal Charcot's joint foot   Abdominal   Peds  Hematology negative hematology ROS (+)   Anesthesia Other Findings   Reproductive/Obstetrics negative OB ROS                             Anesthesia Physical Anesthesia Plan  ASA: II  Anesthesia Plan: Spinal   Post-op Pain Management:    Induction:   Airway Management Planned: Simple Face Mask  Additional Equipment:   Intra-op Plan:   Post-operative Plan:   Informed Consent: I have reviewed the patients History and Physical, chart, labs and discussed the procedure including the risks, benefits and alternatives for the proposed anesthesia with the patient or authorized representative who has indicated his/her understanding and acceptance.   Dental Advisory Given  Plan Discussed with: CRNA and Surgeon  Anesthesia Plan Comments:         Anesthesia Quick Evaluation

## 2014-09-20 ENCOUNTER — Encounter (HOSPITAL_COMMUNITY): Payer: Self-pay | Admitting: Orthopedic Surgery

## 2014-09-20 LAB — BASIC METABOLIC PANEL
ANION GAP: 10 (ref 5–15)
BUN: 23 mg/dL — ABNORMAL HIGH (ref 6–20)
CALCIUM: 8.8 mg/dL — AB (ref 8.9–10.3)
CO2: 21 mmol/L — AB (ref 22–32)
CREATININE: 1.04 mg/dL (ref 0.61–1.24)
Chloride: 106 mmol/L (ref 101–111)
Glucose, Bld: 164 mg/dL — ABNORMAL HIGH (ref 65–99)
Potassium: 4.4 mmol/L (ref 3.5–5.1)
SODIUM: 137 mmol/L (ref 135–145)

## 2014-09-20 LAB — CBC
HCT: 29.4 % — ABNORMAL LOW (ref 39.0–52.0)
Hemoglobin: 10.2 g/dL — ABNORMAL LOW (ref 13.0–17.0)
MCH: 32 pg (ref 26.0–34.0)
MCHC: 34.7 g/dL (ref 30.0–36.0)
MCV: 92.2 fL (ref 78.0–100.0)
PLATELETS: 185 10*3/uL (ref 150–400)
RBC: 3.19 MIL/uL — ABNORMAL LOW (ref 4.22–5.81)
RDW: 13.5 % (ref 11.5–15.5)
WBC: 14 10*3/uL — AB (ref 4.0–10.5)

## 2014-09-20 MED ORDER — DOCUSATE SODIUM 100 MG PO CAPS: 100.0000 mg | ORAL_CAPSULE | Freq: Two times a day (BID) | ORAL | Status: DC

## 2014-09-20 MED ORDER — HYDROCODONE-ACETAMINOPHEN 5-325 MG PO TABS: 1.0000 | ORAL_TABLET | ORAL | Status: DC | PRN

## 2014-09-20 MED ORDER — POLYETHYLENE GLYCOL 3350 17 G PO PACK
17.0000 g | PACK | Freq: Every day | ORAL | Status: DC
  Administered 2014-09-20 – 2014-09-21 (×2): 17 g via ORAL

## 2014-09-20 MED ORDER — ASPIRIN EC 325 MG PO TBEC: 325.0000 mg | DELAYED_RELEASE_TABLET | Freq: Two times a day (BID) | ORAL | Status: DC

## 2014-09-20 MED ORDER — ONDANSETRON HCL 4 MG PO TABS: 4.0000 mg | ORAL_TABLET | Freq: Four times a day (QID) | ORAL | Status: DC | PRN

## 2014-09-20 MED ORDER — SENNA 8.6 MG PO TABS: 2.0000 | ORAL_TABLET | Freq: Every day | ORAL | Status: DC

## 2014-09-20 NOTE — Evaluation (Signed)
Occupational Therapy Evaluation Patient Details Name: Phillip Shelton MRN: [id number] DOB: 12-02-1934 Today's Date: 09/20/2014    History of Present Illness R THR   Clinical Impression   This 80 year old man was admitted for the above surgery (DA approach). All education was completed.  No further OT is needed at this time.    Follow Up Recommendations  No OT follow up    Equipment Recommendations  None recommended by OT    Recommendations for Other Services       Precautions / Restrictions Precautions Precautions: Fall Restrictions Weight Bearing Restrictions: Yes RLE Weight Bearing: Partial weight bearing RLE Partial Weight Bearing Percentage or Pounds: 50%      Mobility Bed Mobility                 Transfers Overall transfer level: Needs assistance Equipment used: Rolling walker (2 wheeled) Transfers: Sit to/from Stand Sit to Stand: Min guard         General transfer comment: cues for UE placement    Balance                                            ADL Overall ADL's : Needs assistance/impaired             Lower Body Bathing: Minimal assistance;Sit to/from stand       Lower Body Dressing: Sit to/from stand;Moderate assistance   Toilet Transfer: Ambulation;Minimal assistance Building services engineer)   Toileting- Clothing Manipulation and Hygiene: Min guard;Sit to/from stand   Tub/ Shower Transfer: Walk-in shower;Minimal assistance     General ADL Comments: pt is able to perform UB adls with set up.  Wife will assist as needed for LB adls.  Practiced shower transfer.  Pt has high commode with 2 bars around it and he feels he will be fine--if not he will get 3:1 commode.  Pt slightly unsteady walking to bathroom--min A given     Vision     Perception     Praxis      Pertinent Vitals/Pain Pain Assessment: 0-10 Pain Score: 5  Pain Location: back mostly Pain Descriptors / Indicators: Aching Pain Intervention(s): Limited  activity within patient's tolerance;Monitored during session;Premedicated before session;Repositioned;Ice applied     Hand Dominance     Extremity/Trunk Assessment Upper Extremity Assessment Upper Extremity Assessment: Overall WFL for tasks assessed      Cervical / Trunk Assessment Cervical / Trunk Assessment: Normal   Communication Communication Communication: No difficulties   Cognition Arousal/Alertness: Awake/alert Behavior During Therapy: WFL for tasks assessed/performed Overall Cognitive Status: Within Functional Limits for tasks assessed                     General Comments       Exercises      Shoulder Instructions      Home Living Family/patient expects to be discharged to:: Private residence Living Arrangements: Spouse/significant other Available Help at Discharge: Family Type of Home: House Home Access: Stairs to enter Technical brewer of Steps: 2 Entrance Stairs-Rails: Right;Left Home Layout: One level     Bathroom Shower/Tub: Occupational psychologist: Handicapped height     Home Equipment: Grab bars - toilet;Grab bars - tub/shower;Shower seat          Prior Functioning/Environment Level of Independence: Independent with assistive device(s)  OT Diagnosis: Acute pain   OT Problem List:     OT Treatment/Interventions:      OT Goals(Current goals can be found in the care plan section) Acute Rehab OT Goals Patient Stated Goal: Walk with less pain  OT Frequency:     Barriers to D/C:            Co-evaluation              End of Session    Activity Tolerance: Patient tolerated treatment well Patient left: in chair;with call bell/phone within reach;with chair alarm set   Time: 4665-9935 OT Time Calculation (min): 13 min Charges:  OT General Charges $OT Visit: 1 Procedure OT Evaluation $Initial OT Evaluation Tier I: 1 Procedure G-Codes:    SPENCER,MARYELLEN Sep 30, 2014, 2:17 PM Lesle Chris, OTR/L [phone] 09-30-2014

## 2014-09-20 NOTE — Progress Notes (Signed)
Physical Therapy Treatment Patient Details Name: Phillip Shelton MRN: [id number] DOB: [birth year] Today's Date: 09/20/2014    History of Present Illness R THR    PT Comments    Pt very motivated and progressing well with mobility.  Pt ltd by back pain and unable to tolerate supine position in bed.  Follow Up Recommendations  Home health PT     Equipment Recommendations  None recommended by PT    Recommendations for Other Services OT consult     Precautions / Restrictions Precautions Precautions: Fall Restrictions Weight Bearing Restrictions: Yes RLE Weight Bearing: Partial weight bearing RLE Partial Weight Bearing Percentage or Pounds: 50%    Mobility  Bed Mobility Overal bed mobility: Needs Assistance Bed Mobility: Supine to Sit;Sit to Supine     Supine to sit: Min assist Sit to supine: Min assist   General bed mobility comments: cues for sequence and use of L LE to self assist  Transfers Overall transfer level: Needs assistance Equipment used: Rolling walker (2 wheeled) Transfers: Sit to/from Stand Sit to Stand: Min guard         General transfer comment: cues for LE management and use of UEs to self assist  Ambulation/Gait Ambulation/Gait assistance: Min assist Ambulation Distance (Feet): 95 Feet (and 15' bed to chair) Assistive device: Rolling walker (2 wheeled) Gait Pattern/deviations: Step-to pattern;Decreased step length - right;Decreased step length - left;Shuffle;Trunk flexed Gait velocity: decr   General Gait Details: cues for sequence, posture, position from RW and increased UE WB to maintain PWB on R LE    Stairs            Wheelchair Mobility    Modified Rankin (Stroke Patients Only)       Balance                                    Cognition Arousal/Alertness: Awake/alert Behavior During Therapy: WFL for tasks assessed/performed Overall Cognitive Status: Within Functional Limits for tasks assessed                      Exercises      General Comments        Pertinent Vitals/Pain Pain Assessment: 0-10 Pain Score: 4  Pain Location: back mostly - min pain at hip Pain Descriptors / Indicators: Aching;Sore Pain Intervention(s): Limited activity within patient's tolerance;Monitored during session;Premedicated before session;Repositioned    Home Living Family/patient expects to be discharged to:: Private residence Living Arrangements: Spouse/significant other Available Help at Discharge: [place]: Grab bars - toilet;Grab bars - tub/shower;Shower seat      Prior Function Level of Independence: Independent with assistive device(s)          PT Goals (current goals can now be found in the care plan section) Acute Rehab PT Goals Patient Stated Goal: Walk with less pain PT Goal Formulation: With patient Time For Goal Achievement: 09/27/14 Potential to Achieve Goals: Good Progress towards PT goals: Progressing toward goals    Frequency  7X/week    PT Plan Current plan remains appropriate    Co-evaluation             End of Session Equipment Utilized During Treatment: Gait belt Activity Tolerance: Patient tolerated treatment well Patient left: in chair;with call bell/phone within reach;with nursing/sitter in room     Time: 4098-1191 PT Time Calculation (min) (ACUTE ONLY):  41 min  Charges:  $Gait Training: 23-37 mins $Therapeutic Activity: 8-22 mins                    G Codes:      BRADSHAW,HUNTER 2014/09/24, 3:15 PM

## 2014-09-20 NOTE — Progress Notes (Signed)
   Subjective:  Patient reports pain as mild.  C/o back pain (h/o back pain). Denies N/V/CP/SOB.  Objective:   VITALS:   Filed Vitals:   09/19/14 2005 09/19/14 2115 09/20/14 0215 09/20/14 0500  BP: 99/52 104/51 102/45 120/48  Pulse: 72 66 83 70  Temp: 98.7 F (37.1 C) 97.9 F (36.6 C) 98.8 F (37.1 C) 98 F (36.7 C)  TempSrc: Oral Oral Oral Oral  Resp: 16 15 15 16   Height:      Weight:      SpO2: 98% 98% 100% 98%    ABD soft Sensation intact distally Intact pulses distally Dorsiflexion/Plantar flexion intact Incision: dressing C/D/I Compartment soft   Lab Results  Component Value Date   WBC 14.0* 09/20/2014   HGB 10.2* 09/20/2014   HCT 29.4* 09/20/2014   MCV 92.2 09/20/2014   PLT 185 09/20/2014   BMET    Component Value Date/Time   NA 137 09/20/2014 0440   K 4.4 09/20/2014 0440   CL 106 09/20/2014 0440   CO2 21* 09/20/2014 0440   GLUCOSE 164* 09/20/2014 0440   BUN 23* 09/20/2014 0440   CREATININE 1.04 09/20/2014 0440   CALCIUM 8.8* 09/20/2014 0440   GFRNONAA >60 09/20/2014 0440   GFRAA >60 09/20/2014 0440     Assessment/Plan: 1 Day Post-Op   Principal Problem:   Osteoarthritis of right hip    50% WB RLE with walker DVT ppx: ASA, SCDs, TEDs PT/OT PO pain control ABLA: asymptomatic, monitor Dispo: d/c home tomorrow   Elie Goody 09/20/2014, 7:13 AM   Rod Can, MD Cell [phone]

## 2014-09-20 NOTE — Discharge Summary (Signed)
Physician Discharge Summary  Patient ID: Phillip Shelton MRN: [id number] DOB/AGE: July 15, 1934 80 y.o.  Admit date: 09/19/2014 Discharge date: 09/22/2014  Admission Diagnoses:  Osteoarthritis of right hip  Discharge Diagnoses:  Principal Problem:   Osteoarthritis of right hip   Past Medical History  Diagnosis Date  . Diverticulosis   . Hemorrhoids   . [place] tear 1981  . Arthritis   . Esophageal stricture   . Hiatal hernia   . GERD (gastroesophageal reflux disease)   . Complication of anesthesia   . PONV (postoperative nausea and vomiting)     Surgeries: Procedure(s): RIGHT TOTAL HIP ARTHROPLASTY ANTERIOR APPROACH on 09/19/2014   Consultants (if any):    Discharged Condition: Improved  Hospital Course: Phillip Shelton is an 80 y.o. male who was admitted 09/19/2014 with a diagnosis of Osteoarthritis of right hip and went to the operating room on 09/19/2014 and underwent the above named procedures.  He was made 50% WB RLE with a walker.  He was given perioperative antibiotics:      Anti-infectives    Start     Dose/Rate Route Frequency Ordered Stop   09/19/14 2000  ceFAZolin (ANCEF) IVPB 2 g/50 mL premix     2 g 100 mL/hr over 30 Minutes Intravenous Every 6 hours 09/19/14 1806 09/20/14 0202   09/19/14 1050  ceFAZolin (ANCEF) IVPB 2 g/50 mL premix     2 g 100 mL/hr over 30 Minutes Intravenous On call to O.R. 09/19/14 1051 09/19/14 1258    .  He was given sequential compression devices, early ambulation, and ASA for DVT prophylaxis.  He benefited maximally from the hospital stay and there were no complications.    Recent vital signs:  Filed Vitals:   09/22/14 0652  BP: 128/51  Pulse: 77  Temp: 97.9 F (36.6 C)  Resp: 16    Recent laboratory studies:  Lab Results  Component Value Date   HGB 8.8* 09/22/2014   HGB 8.8* 09/21/2014   HGB 10.2* 09/20/2014   Lab Results  Component Value Date   WBC 9.7 09/22/2014   PLT 187 09/22/2014   Lab Results   Component Value Date   INR 1.04 09/12/2014   Lab Results  Component Value Date   NA 137 09/20/2014   K 4.4 09/20/2014   CL 106 09/20/2014   CO2 21* 09/20/2014   BUN 23* 09/20/2014   CREATININE 1.04 09/20/2014   GLUCOSE 164* 09/20/2014    Discharge Medications:     Medication List    STOP taking these medications        acetaminophen 500 MG tablet  Commonly known as:  TYLENOL     traMADol 50 MG tablet  Commonly known as:  ULTRAM      TAKE these medications        aspirin EC 325 MG tablet  Take 1 tablet (325 mg total) by mouth 2 (two) times daily after a meal.     docusate sodium 100 MG capsule  Commonly known as:  COLACE  Take 1 capsule (100 mg total) by mouth 2 (two) times daily.     fish oil-omega-3 fatty acids 1000 MG capsule  Take 1 g by mouth at bedtime.     HYDROcodone-acetaminophen 5-325 MG per tablet  Commonly known as:  NORCO  Take 1-2 tablets by mouth every 4 (four) hours as needed for moderate pain.     meloxicam 7.5 MG tablet  Commonly known as:  MOBIC  TAKE 1  TABLET (7.5 MG TOTAL) BY MOUTH DAILY.     omeprazole 20 MG capsule  Commonly known as:  PRILOSEC  Take 20 mg by mouth as needed (heartburn).     ondansetron 4 MG tablet  Commonly known as:  ZOFRAN  Take 1 tablet (4 mg total) by mouth every 6 (six) hours as needed for nausea.     senna 8.6 MG Tabs tablet  Commonly known as:  SENOKOT  Take 2 tablets (17.2 mg total) by mouth at bedtime.     silodosin 8 MG Caps capsule  Commonly known as:  RAPAFLO  Take 1 capsule (8 mg total) by mouth at bedtime.        Diagnostic Studies: Dg Pelvis Portable  09/19/2014   CLINICAL DATA:  Status post total hip replacement on the right  EXAM: PORTABLE PELVIS 1-2 VIEWS  COMPARISON:  None.  FINDINGS: The superior aspects of each iliac crest are not visualized on this study. There is a total hip prosthesis on the right with prosthetic components appearing well-seated. No acute fracture or dislocation. There  is moderate osteoarthritic change in the left hip joint.  IMPRESSION: Status post right total hip arthroplasty with prosthetic components appearing well-seated. No acute fracture dislocation. Osteoarthritic change left hip joint.   Electronically Signed   By: Lowella Grip III M.D.   On: 09/19/2014 16:56   Dg C-arm Gt 120 Min-no Report  09/19/2014   CLINICAL DATA: hip   C-ARM GT 120 MINUTE  Fluoroscopy was utilized by the requesting physician.  No radiographic  interpretation.    Dg Hip Operative Unilat With Pelvis Right  09/19/2014   CLINICAL DATA:  Right total hip arthroplasty  EXAM: DG C-ARM GT 120 MIN-NO REPORT; OPERATIVE RIGHT HIP WITH PELVIS  COMPARISON:  None.  FLUOROSCOPY TIME:  Radiation Exposure Index (as provided by the fluoroscopic device):  If the device does not provide the exposure index:  Fluoroscopy Time:  1 min 18 seconds  Number of Acquired Images:  2  FINDINGS: The patient is status post right total hip arthroplasty. The hardware components are in anatomic alignment. No dislocation or periprosthetic fracture.  IMPRESSION: Status post right total hip arthroplasty.   Electronically Signed   By: Kerby Moors M.D.   On: 09/19/2014 15:52    Disposition: 01-Home or Self Care  Discharge Instructions    Call MD / Call 911    Complete by:  As directed   If you experience chest pain or shortness of breath, CALL 911 and be transported to the hospital emergency room.  If you develope a fever above 101 F, pus (white drainage) or increased drainage or redness at the wound, or calf pain, call your surgeon's office.     Constipation Prevention    Complete by:  As directed   Drink plenty of fluids.  Prune juice may be helpful.  You may use a stool softener, such as Colace (over the counter) 100 mg twice a day.  Use MiraLax (over the counter) for constipation as needed.     Diet - low sodium heart healthy    Complete by:  As directed      Discharge instructions    Complete by:  As  directed   50% weight bearing right leg     Driving restrictions    Complete by:  As directed   No driving for 6 weeks     Increase activity slowly as tolerated    Complete by:  As directed  Lifting restrictions    Complete by:  As directed   No lifting for 6 weeks     TED hose    Complete by:  As directed   Use stockings (TED hose) for 2 weeks on both leg(s).  You may remove them at night for sleeping.           Follow-up Information    Follow up with Swinteck, Horald Pollen, MD. Schedule an appointment as soon as possible for a visit in 2 weeks.   Specialty:  Orthopedic Surgery   Why:  For wound re-check   Contact information:   [address] [place] [address] [phone]       Follow up with [facility].   Why:  physical therapy   Contact information:   [address] [place] [address] [phone]        Signed: Elie Goody 09/22/2014, 7:42 AM

## 2014-09-20 NOTE — Anesthesia Postprocedure Evaluation (Signed)
  Anesthesia Post-op Note  Patient: Phillip Shelton  Procedure(s) Performed: Procedure(s) (LRB): RIGHT TOTAL HIP ARTHROPLASTY ANTERIOR APPROACH (Right)  Patient Location: PACU  Anesthesia Type: spinal  Level of Consciousness: awake and alert   Airway and Oxygen Therapy: Patient Spontanous Breathing  Post-op Pain: mild  Post-op Assessment: Post-op Vital signs reviewed, Patient's Cardiovascular Status Stable, Respiratory Function Stable, Patent Airway and No signs of Nausea or vomiting  Last Vitals:  Filed Vitals:   09/20/14 0500  BP: 120/48  Pulse: 70  Temp: 36.7 C  Resp: 16    Post-op Vital Signs: stable   Complications: No apparent anesthesia complications

## 2014-09-20 NOTE — Progress Notes (Signed)
OT Cancellation Note  Patient Details Name: Phillip Shelton MRN: [id number] DOB: Aug 18, 1934   Cancelled Treatment:    Reason Eval/Treat Not Completed: Other (comment).  Pt is nauseous this am.  Will check back.  SPENCER,MARYELLEN 09/20/2014, 9:03 AM  Lesle Chris, OTR/L [phone] 09/20/2014

## 2014-09-20 NOTE — Care Management Note (Signed)
Case Management Note  Patient Details  Name: Phillip Shelton MRN: [id number] Date of Birth: Jun 26, 1934  Subjective/Objective:          Right total hip arthroplasty, anterior approach          Action/Plan: Discharge planning, patient prearranged with Ar[facility] Go for [facility] services. Contacted [facility] to confirm. No DME needs.   Expected Discharge Date:                  Expected Discharge Plan:  [place]  In-House Referral:  NA  Discharge planning Services  CM Consult  Post Acute Care Choice:  Home Health Choice offered to:  Patient  DME Arranged:  N/A DME Agency:  NA  HH Arranged:  PT HH Agency:  [facility]  Status of Service:  Completed, signed off  Medicare Important Message Given:    Date Medicare IM Given:    Medicare IM give by:    Date Additional Medicare IM Given:    Additional Medicare Important Message give by:     If discussed at [place] of Stay Meetings, dates discussed:    Additional Comments:  Guadalupe Maple, RN 09/20/2014, 10:41 AM

## 2014-09-20 NOTE — Plan of Care (Signed)
Problem: Consults Goal: Diagnosis- Total Joint Replacement Outcome: Completed/Met Date Met:  09/20/14 Primary Total Hip RIGHT, Anterior

## 2014-09-20 NOTE — Progress Notes (Signed)
Physical Therapy Evaluation Patient Details Name: Phillip Shelton MRN: [id number] DOB: 03/15/1934 Today's Date: 09/20/2014   History of Present Illness  R THR  Clinical Impression  Pt s/p R THR presents with functional mobility limitations 2* decreased R LE strength/ROM, post op back and hip pain, and PWB status on R LE.  Pt should progress to dc home with family assist and HHPT follow up.    Follow Up Recommendations Home health PT    Equipment Recommendations  None recommended by PT    Recommendations for Other Services OT consult     Precautions / Restrictions Precautions Precautions: Fall Restrictions Weight Bearing Restrictions: Yes RLE Weight Bearing: Partial weight bearing RLE Partial Weight Bearing Percentage or Pounds: 50%      Mobility  Bed Mobility               General bed mobility comments: NT - Pt OOB and declines back to bed 2* back pain in supine  Transfers Overall transfer level: Needs assistance Equipment used: None Transfers: Sit to/from Stand Sit to Stand: Min assist;Mod assist         General transfer comment: cues for transition position, LE management and use of UEs to self assist  Ambulation/Gait Ambulation/Gait assistance: Min assist Ambulation Distance (Feet): 54 Feet Assistive device: Rolling walker (2 wheeled) Gait Pattern/deviations: Step-to pattern;Decreased step length - right;Decreased step length - left;Shuffle;Trunk flexed Gait velocity: decr   General Gait Details: cues for sequence, posture, position from RW and increased UE WB to maintain PWB on R LE   Stairs            Wheelchair Mobility    Modified Rankin (Stroke Patients Only)       Balance                                             Pertinent Vitals/Pain Pain Assessment: 0-10 Pain Score: 5  Pain Location: Back and R hip Pain Descriptors / Indicators: Aching;Sore Pain Intervention(s): Limited activity within patient's  tolerance;Monitored during session;Premedicated before session;Ice applied    Home Living Family/patient expects to be discharged to:: Private residence Living Arrangements: Spouse/significant other Available Help at Discharge: Family Type of Home: House Home Access: Stairs to enter Entrance Stairs-Rails: Psychiatric nurse of Steps: 2 Home Layout: One level Home Equipment: Environmental consultant - 2 wheels;Cane - single point;Crutches      Prior Function Level of Independence: Independent with assistive device(s)               Hand Dominance        Extremity/Trunk Assessment   Upper Extremity Assessment: Overall WFL for tasks assessed           Lower Extremity Assessment: RLE deficits/detail RLE Deficits / Details: 3-/5 strength at hip with AAROM at hip to 85 flex and 10 abd    Cervical / Trunk Assessment: Normal  Communication   Communication: No difficulties  Cognition Arousal/Alertness: Awake/alert Behavior During Therapy: WFL for tasks assessed/performed Overall Cognitive Status: Within Functional Limits for tasks assessed                      General Comments      Exercises Total Joint Exercises Ankle Circles/Pumps: AROM;Both;15 reps;Supine Quad Sets: AROM;Both;10 reps;Supine Heel Slides: AAROM;20 reps;Supine;Right Hip ABduction/ADduction: AAROM;Right;10 reps;Supine      Assessment/Plan  PT Assessment Patient needs continued PT services  PT Diagnosis Difficulty walking   PT Problem List Decreased strength;Decreased range of motion;Decreased activity tolerance;Decreased mobility;Decreased knowledge of use of DME;Pain  PT Treatment Interventions DME instruction;Gait training;Stair training;Functional mobility training;Therapeutic activities;Therapeutic exercise;Patient/family education   PT Goals (Current goals can be found in the Care Plan section) Acute Rehab PT Goals Patient Stated Goal: Walk with less pain PT Goal Formulation: With  patient Time For Goal Achievement: 09/27/14 Potential to Achieve Goals: Good    Frequency 7X/week   Barriers to discharge        Co-evaluation               End of Session Equipment Utilized During Treatment: Gait belt Activity Tolerance: Patient tolerated treatment well Patient left: in chair;with call bell/phone within reach Nurse Communication: Mobility status         Time: 1020-1055 PT Time Calculation (min) (ACUTE ONLY): 35 min   Charges:   PT Evaluation $Initial PT Evaluation Tier I: 1 Procedure PT Treatments $Gait Training: 8-22 mins   PT G Codes:        BRADSHAW,HUNTER 2014-09-23, 1:57 PM

## 2014-09-21 LAB — CBC
HCT: 26.7 % — ABNORMAL LOW (ref 39.0–52.0)
HEMOGLOBIN: 8.8 g/dL — AB (ref 13.0–17.0)
MCH: 30.2 pg (ref 26.0–34.0)
MCHC: 33 g/dL (ref 30.0–36.0)
MCV: 91.8 fL (ref 78.0–100.0)
Platelets: 179 10*3/uL (ref 150–400)
RBC: 2.91 MIL/uL — AB (ref 4.22–5.81)
RDW: 13.7 % (ref 11.5–15.5)
WBC: 12.5 10*3/uL — ABNORMAL HIGH (ref 4.0–10.5)

## 2014-09-21 MED ORDER — FLEET ENEMA 7-19 GM/118ML RE ENEM
1.0000 | ENEMA | Freq: Once | RECTAL | Status: AC
  Administered 2014-09-21: 1 via RECTAL
  Filled 2014-09-21: qty 1

## 2014-09-21 MED ORDER — MAGNESIUM CITRATE PO SOLN
0.5000 | Freq: Once | ORAL | Status: AC
  Administered 2014-09-21: 0.5 via ORAL

## 2014-09-21 NOTE — Progress Notes (Signed)
Physical Therapy Treatment Patient Details Name: Phillip Shelton MRN: [id number] DOB: 1934-08-13 Today's Date: 10-21-14    History of Present Illness R THR    PT Comments    Pt cooperative and motivated but ltd by ongoing nausea and fatigue.  Follow Up Recommendations  Home health PT     Equipment Recommendations  None recommended by PT    Recommendations for Other Services OT consult     Precautions / Restrictions Precautions Precautions: Fall Restrictions Weight Bearing Restrictions: Yes RLE Weight Bearing: Partial weight bearing RLE Partial Weight Bearing Percentage or Pounds: 50%    Mobility  Bed Mobility               General bed mobility comments: Pt dexlines back to bed 2* back pain  Transfers Overall transfer level: Needs assistance Equipment used: Rolling walker (2 wheeled) Transfers: Sit to/from Stand Sit to Stand: Min guard         General transfer comment: cues for LE management and use of UEs to self assist  Ambulation/Gait Ambulation/Gait assistance: Min assist;Min guard Ambulation Distance (Feet): 78 Feet Assistive device: Rolling walker (2 wheeled) Gait Pattern/deviations: Step-to pattern;Decreased step length - right;Decreased step length - left;Shuffle;Trunk flexed Gait velocity: decr   General Gait Details: cues for sequence, posture, position from RW and increased UE WB to maintain PWB on R LE    Stairs            Wheelchair Mobility    Modified Rankin (Stroke Patients Only)       Balance                                    Cognition Arousal/Alertness: Awake/alert Behavior During Therapy: WFL for tasks assessed/performed Overall Cognitive Status: Within Functional Limits for tasks assessed                      Exercises      General Comments        Pertinent Vitals/Pain Pain Assessment: 0-10 Pain Score: 4  Pain Location: Back and R hip Pain Descriptors / Indicators:  Aching;Sore Pain Intervention(s): Limited activity within patient's tolerance;Monitored during session;Premedicated before session    Home Living                      Prior Function            PT Goals (current goals can now be found in the care plan section) Acute Rehab PT Goals Patient Stated Goal: Walk with less pain PT Goal Formulation: With patient Time For Goal Achievement: 09/27/14 Potential to Achieve Goals: Good Progress towards PT goals: Progressing toward goals    Frequency  7X/week    PT Plan Current plan remains appropriate    Co-evaluation             End of Session Equipment Utilized During Treatment: Gait belt Activity Tolerance: Other (comment);Patient limited by fatigue (nausea) Patient left: in chair;with call bell/phone within reach;with nursing/sitter in room     Time: 1121-1144 PT Time Calculation (min) (ACUTE ONLY): 23 min  Charges:  $Gait Training: 23-37 mins                    G Codes:      BRADSHAW,HUNTER 2014/10/21, 12:49 PM

## 2014-09-21 NOTE — Progress Notes (Signed)
Patient ID: Phillip Shelton, male   DOB: 11/01/1934, 80 y.o.   MRN: [id number]    Subjective: 2 Days Post-Op Procedure(s) (LRB): RIGHT TOTAL HIP ARTHROPLASTY ANTERIOR APPROACH (Right) Patient reports pain as 3 on 0-10 scale.   Denies CP or SOB.  Voiding without difficulty. Positive flatus.Pt has not had a BM. Abd soft  Objective: Vital signs in last 24 hours: Temp:  [97.8 F (36.6 C)-98.7 F (37.1 C)] 97.8 F (36.6 C) (09/17 0554) Pulse Rate:  [74-78] 74 (09/17 0554) Resp:  [16-20] 16 (09/17 0554) BP: (125-155)/(50-78) 125/55 mmHg (09/17 0554) SpO2:  [96 %-100 %] 96 % (09/17 0554)  Intake/Output from previous day: 09/16 0701 - 09/17 0700 In: 2900 [P.O.:600; I.V.:2300] Out: 785 [Urine:785] Intake/Output this shift:    Labs:  Recent Labs  09/20/14 0440 09/21/14 0438  HGB 10.2* 8.8*    Recent Labs  09/20/14 0440 09/21/14 0438  WBC 14.0* 12.5*  RBC 3.19* 2.91*  HCT 29.4* 26.7*  PLT 185 179    Recent Labs  09/20/14 0440  NA 137  K 4.4  CL 106  CO2 21*  BUN 23*  CREATININE 1.04  GLUCOSE 164*  CALCIUM 8.8*   No results for input(s): LABPT, INR in the last 72 hours.  Physical Exam: Neurologically intact ABD soft Sensation intact distally Intact pulses distally Incision: dressing C/D/I and no drainage Compartment soft  Assessment/Plan: 2 Days Post-Op Procedure(s) (LRB): RIGHT TOTAL HIP ARTHROPLASTY ANTERIOR APPROACH (Right) Advance diet Up with therapy  50% WB with walker Continue Mag Citrate Started Fleets enema Possible DC home today  [place], Darla Lesches for Dr. Melina Schools [facility] [phone] 09/21/2014, 8:05 AM

## 2014-09-21 NOTE — Progress Notes (Signed)
Physical Therapy Treatment Patient Details Name: Phillip Shelton MRN: [id number] DOB: May 11, 1934 Today's Date: 10-13-2014    History of Present Illness R THR    PT Comments    Pt continues motivated but limited by ongoing nausea.    Follow Up Recommendations  Home health PT     Equipment Recommendations  None recommended by PT    Recommendations for Other Services OT consult     Precautions / Restrictions Precautions Precautions: Fall Restrictions Weight Bearing Restrictions: Yes RLE Weight Bearing: Partial weight bearing RLE Partial Weight Bearing Percentage or Pounds: 50%    Mobility  Bed Mobility                  Transfers Overall transfer level: Needs assistance Equipment used: Rolling walker (2 wheeled) Transfers: Sit to/from Stand Sit to Stand: Min guard         General transfer comment: cues for LE management and use of UEs to self assist  Ambulation/Gait Ambulation/Gait assistance: Min assist;Min guard Ambulation Distance (Feet): 100 Feet Assistive device: Rolling walker (2 wheeled) Gait Pattern/deviations: Step-to pattern;Shuffle;Decreased step length - right;Decreased step length - left;Trunk flexed Gait velocity: decr   General Gait Details: cues for sequence, posture, position from RW and increased UE WB to maintain PWB on R LE    Stairs            Wheelchair Mobility    Modified Rankin (Stroke Patients Only)       Balance                                    Cognition Arousal/Alertness: Awake/alert Behavior During Therapy: WFL for tasks assessed/performed Overall Cognitive Status: Within Functional Limits for tasks assessed                      Exercises      General Comments        Pertinent Vitals/Pain Pain Assessment: 0-10 Pain Score: 4  Pain Location: Back and R hip Pain Descriptors / Indicators: Aching;Sore Pain Intervention(s): Limited activity within patient's tolerance;Monitored  during session;Premedicated before session    Home Living                      Prior Function            PT Goals (current goals can now be found in the care plan section) Acute Rehab PT Goals Patient Stated Goal: Walk with less pain PT Goal Formulation: With patient Time For Goal Achievement: 09/27/14 Potential to Achieve Goals: Good Progress towards PT goals: Progressing toward goals    Frequency  7X/week    PT Plan Current plan remains appropriate    Co-evaluation             End of Session Equipment Utilized During Treatment: Gait belt Activity Tolerance: Other (comment);Patient limited by fatigue (nausea) Patient left: in chair;with call bell/phone within reach;with nursing/sitter in room     Time: 1358-1415 PT Time Calculation (min) (ACUTE ONLY): 17 min  Charges:  $Gait Training: 8-22 mins                    G Codes:      BRADSHAW,HUNTER 2014/10/13, 3:30 PM

## 2014-09-21 NOTE — Progress Notes (Signed)
Physical Therapy Treatment Patient Details Name: Phillip Shelton MRN: [id number] DOB: 03/08/1934 Today's Date: 09/21/2014    History of Present Illness R THR    PT Comments    Pt continues pleasant and motivated but limited by ongoing nausea/constipation.  Pt is increasingly fatigued and shaky with mobility - most notably with attempts to negotiate stairs.    Follow Up Recommendations  Home health PT     Equipment Recommendations  None recommended by PT    Recommendations for Other Services OT consult     Precautions / Restrictions Precautions Precautions: Fall Restrictions Weight Bearing Restrictions: Yes RLE Weight Bearing: Partial weight bearing RLE Partial Weight Bearing Percentage or Pounds: 50%    Mobility  Bed Mobility               General bed mobility comments: Pt up in chair with mag citrate taken - RN staes they will assist back to bed and attempt to position on side for comfort  Transfers Overall transfer level: Needs assistance Equipment used: Rolling walker (2 wheeled) Transfers: Sit to/from Stand Sit to Stand: Min assist         General transfer comment: cues for LE management and use of UEs to self assist  Ambulation/Gait Ambulation/Gait assistance: Min assist;Min guard Ambulation Distance (Feet): 40 Feet Assistive device: Rolling walker (2 wheeled) Gait Pattern/deviations: Step-to pattern;Decreased step length - right;Decreased step length - left;Shuffle;Trunk flexed Gait velocity: decr   General Gait Details: cues for sequence, posture, position from RW and increased UE WB to maintain PWB on R LE    Stairs Stairs: Yes Stairs assistance: Min assist Stair Management: Step to pattern;No rails;One rail Right;Backwards;Forwards;With walker;With crutches Number of Stairs: 4 General stair comments: 2 stairs fwd with crutch and rail; 2 step bkwd with RW; cues for sequence and foot/RW placement.  Wife present and very anxious.  Pt shaky and  unable to attempt 3rd time.  Written instructions provided  Wheelchair Mobility    Modified Rankin (Stroke Patients Only)       Balance                                    Cognition Arousal/Alertness: Awake/alert Behavior During Therapy: WFL for tasks assessed/performed Overall Cognitive Status: Within Functional Limits for tasks assessed                      Exercises      General Comments        Pertinent Vitals/Pain Pain Assessment: 0-10 Pain Score: 4  Pain Location: back and R hip Pain Descriptors / Indicators: Aching;Sore Pain Intervention(s): Limited activity within patient's tolerance;Monitored during session;Premedicated before session    Home Living                      Prior Function            PT Goals (current goals can now be found in the care plan section) Acute Rehab PT Goals Patient Stated Goal: Get over this awful nausea and constipation PT Goal Formulation: With patient Time For Goal Achievement: 09/27/14 Potential to Achieve Goals: Good Progress towards PT goals: Progressing toward goals    Frequency  7X/week    PT Plan Current plan remains appropriate    Co-evaluation             End of Session Equipment Utilized During Treatment: Gait  belt Activity Tolerance: Other (comment);Patient limited by fatigue Patient left: Other (comment) (bathroom - nursing aware)     Time: 8329-1916 PT Time Calculation (min) (ACUTE ONLY): 23 min  Charges:  $Gait Training: 8-22 mins $Therapeutic Activity: 23-37 mins                    G Codes:      BRADSHAW,HUNTER 2014/10/07, 3:41 PM

## 2014-09-21 NOTE — Care Management Note (Signed)
Case Management Note  Patient Details  Name: Phillip Shelton MRN: [id number] Date of Birth: August 29, 1934  Subjective/Objective:       R THR             Action/Plan:  Home Health- NCM spoke to pt and offered choice for [facility] Pt agreeable to [facility] for [facility]. Notified [facility] of recommendation for HHPT, and order needed. Contacted [facility] for 3n1 for home. 3n1 delivered to room. Pt did not request or want Rolling Walker with seat. Has RW at home. [facility] made aware.  Wife will be at home to assist with his care.  Expected Discharge Date:                  Expected Discharge Plan:  [place]  In-House Referral:  NA  Discharge planning Services  CM Consult  Post Acute Care Choice:  Home Health Choice offered to:  Patient  DME Arranged:  3-N-1 DME Agency:  [place]:  PT [place]:  [place]  Status of Service:  Completed, signed off  Medicare Important Message Given:    Date Medicare IM Given:    Medicare IM give by:    Date Additional Medicare IM Given:    Additional Medicare Important Message give by:     If discussed at [place] of Stay Meetings, dates discussed:    Additional Comments:  Erenest Rasher, RN 09/21/2014, 5:48 PM

## 2014-09-22 LAB — CBC
HEMATOCRIT: 25.6 % — AB (ref 39.0–52.0)
HEMOGLOBIN: 8.8 g/dL — AB (ref 13.0–17.0)
MCH: 31.7 pg (ref 26.0–34.0)
MCHC: 34.4 g/dL (ref 30.0–36.0)
MCV: 92.1 fL (ref 78.0–100.0)
Platelets: 187 10*3/uL (ref 150–400)
RBC: 2.78 MIL/uL — ABNORMAL LOW (ref 4.22–5.81)
RDW: 13.7 % (ref 11.5–15.5)
WBC: 9.7 10*3/uL (ref 4.0–10.5)

## 2014-09-22 MED ORDER — SORBITOL 70 % SOLN
960.0000 mL | TOPICAL_OIL | Freq: Once | ORAL | Status: DC
  Filled 2014-09-22: qty 240

## 2014-09-22 NOTE — Plan of Care (Signed)
Problem: Phase III Progression Outcomes Goal: Anticoagulant follow-up in place Outcome: Not Applicable Date Met:  37/30/81 ASA VTE, no f/u needed.

## 2014-09-22 NOTE — Progress Notes (Signed)
Physical Therapy Treatment Patient Details Name: NEZIAH BRALEY MRN: [id number] DOB: [birth year] Today's Date: 09/22/2014    History of Present Illness R THR    PT Comments    For DC.feels better about the steps.  Follow Up Recommendations  Home health PT     Equipment Recommendations       Recommendations for Other Services       Precautions / Restrictions Restrictions RLE Weight Bearing: Partial weight bearing RLE Partial Weight Bearing Percentage or Pounds: 50    Mobility  Bed Mobility   Bed Mobility: Supine to Sit;Sit to Supine     Supine to sit: Min assist Sit to supine: Min assist   General bed mobility comments: assist R leg  Transfers   Equipment used: Rolling walker (2 wheeled) Transfers: Sit to/from Stand Sit to Stand: Min guard         General transfer comment: cues for LE management and use of UEs to self assist  Ambulation/Gait Ambulation/Gait assistance: Min guard Ambulation Distance (Feet): 90 Feet Assistive device: Rolling walker (2 wheeled) Gait Pattern/deviations: Step-to pattern;Decreased step length - right     General Gait Details: cues for sequence, posture, position from RW and increased UE WB to maintain PWB on R LE    Stairs Stairs: Yes Stairs assistance: Min assist Stair Management: One rail Left;Step to pattern;Forwards;With crutches Number of Stairs: 2 General stair comments: wife present . patient to use crutch on R side. practiced 1 step backward with RW.  Wheelchair Mobility    Modified Rankin (Stroke Patients Only)       Balance                                    Cognition Arousal/Alertness: Awake/alert                          Exercises Total Joint Exercises Ankle Circles/Pumps: AROM;Both;15 reps;Supine Quad Sets: AROM;Both;10 reps;Supine Short Arc Quad: AROM;Right;10 reps;Supine Heel Slides: AAROM;20 reps;Supine;Right Hip ABduction/ADduction: AAROM;Right;10 reps;Supine    General Comments        Pertinent Vitals/Pain Pain Score: 2  Pain Location: back and R thigh Pain Descriptors / Indicators: Aching;Tightness Pain Intervention(s): Repositioned;Ice applied    Home Living                      Prior Function            PT Goals (current goals can now be found in the care plan section) Progress towards PT goals: Progressing toward goals    Frequency       PT Plan Current plan remains appropriate    Co-evaluation             End of Session Equipment Utilized During Treatment: Gait belt Activity Tolerance: Patient tolerated treatment well Patient left: in chair;with call bell/phone within reach     Time: 1107-1140 PT Time Calculation (min) (ACUTE ONLY): 33 min  Charges:  $Gait Training: 8-22 mins $Therapeutic Exercise: 8-22 mins                    G Codes:      Claretha Cooper 09/22/2014, 2:42 PM

## 2014-09-22 NOTE — Plan of Care (Addendum)
Problem: Discharge Progression Outcomes Goal: Barriers To Progression Addressed/Resolved Outcome: Completed/Met Date Met:  09/22/14 Pt had large BM prior to getting SMOG enema. Enema not given. Kateryna Grantham, [employer]

## 2014-09-22 NOTE — Progress Notes (Signed)
   Subjective:  Patient reports pain as mild.  C/o back pain (h/o back pain). Had nausea yesterday. No emesis. Small BM x1 yesterday - no significant BM in 5 days.  Objective:   VITALS:   Filed Vitals:   09/21/14 0915 09/21/14 1320 09/21/14 2100 09/22/14 0652  BP: 131/53 114/55 145/59 128/51  Pulse: 75 81 84 77  Temp: 97.6 F (36.4 C) 98.6 F (37 C) 99.4 F (37.4 C) 97.9 F (36.6 C)  TempSrc: Oral Oral Oral Oral  Resp: 17 17 13 16   Height:      Weight:      SpO2: 96% 98% 97% 97%    ABD soft Sensation intact distally Intact pulses distally Dorsiflexion/Plantar flexion intact Incision: dressing C/D/I Compartment soft   Lab Results  Component Value Date   WBC 9.7 09/22/2014   HGB 8.8* 09/22/2014   HCT 25.6* 09/22/2014   MCV 92.1 09/22/2014   PLT 187 09/22/2014   BMET    Component Value Date/Time   NA 137 09/20/2014 0440   K 4.4 09/20/2014 0440   CL 106 09/20/2014 0440   CO2 21* 09/20/2014 0440   GLUCOSE 164* 09/20/2014 0440   BUN 23* 09/20/2014 0440   CREATININE 1.04 09/20/2014 0440   CALCIUM 8.8* 09/20/2014 0440   GFRNONAA >60 09/20/2014 0440   GFRAA >60 09/20/2014 0440     Assessment/Plan: 3 Days Post-Op   Principal Problem:   Osteoarthritis of right hip    50% WB RLE with walker DVT ppx: ASA, SCDs, TEDs PT/OT PO pain control ABLA: asymptomatic, monitor Constipation: cont bowel regimen, had fleet enema and mag citrate yesterday, will give SMOG enema today and monitor for ileus Dispo: d/c home today vs tomorrow after BM   [place], Horald Pollen 09/22/2014, 8:06 AM   Rod Can, MD Cell [phone]

## 2014-09-22 NOTE — Progress Notes (Signed)
Discharged from floor via w/c, belongings & wife with pt. No changes in assessment. Phillip Shelton  

## 2014-09-23 ENCOUNTER — Telehealth: Payer: Self-pay | Admitting: Endocrinology

## 2014-09-23 NOTE — Telephone Encounter (Signed)
Patient called stating that he would like Dr.Kumar to send an Rx to his pharmacy  He was just recently discharged from the hospital   Rx: Flomax (this was given in hospital)  Pharmacy: [facility]    Thank you

## 2014-09-24 ENCOUNTER — Other Ambulatory Visit: Payer: Self-pay

## 2014-09-24 ENCOUNTER — Other Ambulatory Visit: Payer: Medicare Other

## 2014-09-24 MED ORDER — SILODOSIN 8 MG PO CAPS: 8.0000 mg | ORAL_CAPSULE | Freq: Every day | ORAL | Status: DC

## 2014-09-24 NOTE — Telephone Encounter (Signed)
Rapaflo was sent to the Rx

## 2014-09-24 NOTE — Telephone Encounter (Signed)
He needs to go back on Rapaflo

## 2014-09-24 NOTE — Telephone Encounter (Signed)
Will you prescribe this medication for this patient?

## 2014-09-27 ENCOUNTER — Ambulatory Visit: Payer: Medicare Other | Admitting: Endocrinology

## 2014-10-17 ENCOUNTER — Telehealth: Payer: Self-pay | Admitting: Endocrinology

## 2014-10-17 ENCOUNTER — Other Ambulatory Visit: Payer: Self-pay | Admitting: *Deleted

## 2014-10-17 DIAGNOSIS — N390 Urinary tract infection, site not specified: Secondary | ICD-10-CM

## 2014-10-17 NOTE — Telephone Encounter (Signed)
Needs to come in for urinalysis in the morning

## 2014-10-17 NOTE — Telephone Encounter (Signed)
Noted, they are coming in in the morning

## 2014-10-17 NOTE — Telephone Encounter (Signed)
Please see below,  I spoke with his wife who said he still can't sleep, gets up every 15 minutes, she said he's not having any burning or itching, but the odor is terrible, she's concerned about a UTI.

## 2014-10-17 NOTE — Telephone Encounter (Signed)
Please call the pt back regarding the issues he is having, not sleeping, using restroom every 15 to urinate with terrible odor.

## 2014-10-18 ENCOUNTER — Other Ambulatory Visit (INDEPENDENT_AMBULATORY_CARE_PROVIDER_SITE_OTHER): Payer: Medicare Other

## 2014-10-18 DIAGNOSIS — N390 Urinary tract infection, site not specified: Secondary | ICD-10-CM

## 2014-10-18 LAB — URINALYSIS
BILIRUBIN URINE: NEGATIVE
HGB URINE DIPSTICK: NEGATIVE
KETONES UR: NEGATIVE
LEUKOCYTES UA: NEGATIVE
Nitrite: NEGATIVE
SPECIFIC GRAVITY, URINE: 1.02 (ref 1.000–1.030)
TOTAL PROTEIN, URINE-UPE24: NEGATIVE
URINE GLUCOSE: NEGATIVE
UROBILINOGEN UA: 0.2 (ref 0.0–1.0)
pH: 5.5 (ref 5.0–8.0)

## 2014-10-20 NOTE — Progress Notes (Signed)
Quick Note:  Please let patient know that the lab result is normal and will need to discuss symptoms with urologist ______

## 2014-11-07 ENCOUNTER — Other Ambulatory Visit: Payer: Self-pay | Admitting: *Deleted

## 2014-11-07 MED ORDER — MELOXICAM 7.5 MG PO TABS: ORAL_TABLET | ORAL | Status: DC

## 2014-11-15 ENCOUNTER — Other Ambulatory Visit (INDEPENDENT_AMBULATORY_CARE_PROVIDER_SITE_OTHER): Payer: Medicare Other

## 2014-11-15 ENCOUNTER — Other Ambulatory Visit: Payer: Self-pay | Admitting: Endocrinology

## 2014-11-15 DIAGNOSIS — N401 Enlarged prostate with lower urinary tract symptoms: Secondary | ICD-10-CM

## 2014-11-15 DIAGNOSIS — R351 Nocturia: Secondary | ICD-10-CM

## 2014-11-15 DIAGNOSIS — M47817 Spondylosis without myelopathy or radiculopathy, lumbosacral region: Secondary | ICD-10-CM

## 2014-11-15 DIAGNOSIS — E7800 Pure hypercholesterolemia, unspecified: Secondary | ICD-10-CM | POA: Diagnosis not present

## 2014-11-15 LAB — URINALYSIS, ROUTINE W REFLEX MICROSCOPIC
Bilirubin Urine: NEGATIVE
Hgb urine dipstick: NEGATIVE
Ketones, ur: NEGATIVE
LEUKOCYTES UA: NEGATIVE
Nitrite: NEGATIVE
PH: 5.5 (ref 5.0–8.0)
SPECIFIC GRAVITY, URINE: 1.025 (ref 1.000–1.030)
Total Protein, Urine: NEGATIVE
UROBILINOGEN UA: 0.2 (ref 0.0–1.0)
Urine Glucose: NEGATIVE

## 2014-11-15 LAB — CBC WITH DIFFERENTIAL/PLATELET
BASOS PCT: 0.6 % (ref 0.0–3.0)
Basophils Absolute: 0 10*3/uL (ref 0.0–0.1)
Eosinophils Absolute: 0.2 10*3/uL (ref 0.0–0.7)
Eosinophils Relative: 2.8 % (ref 0.0–5.0)
HEMATOCRIT: 39.1 % (ref 39.0–52.0)
HEMOGLOBIN: 12.9 g/dL — AB (ref 13.0–17.0)
LYMPHS PCT: 27.8 % (ref 12.0–46.0)
Lymphs Abs: 1.8 10*3/uL (ref 0.7–4.0)
MCHC: 33 g/dL (ref 30.0–36.0)
MCV: 92.4 fl (ref 78.0–100.0)
MONOS PCT: 13.3 % — AB (ref 3.0–12.0)
Monocytes Absolute: 0.9 10*3/uL (ref 0.1–1.0)
NEUTROS ABS: 3.7 10*3/uL (ref 1.4–7.7)
Neutrophils Relative %: 55.5 % (ref 43.0–77.0)
PLATELETS: 248 10*3/uL (ref 150.0–400.0)
RBC: 4.23 Mil/uL (ref 4.22–5.81)
RDW: 14.1 % (ref 11.5–15.5)
WBC: 6.6 10*3/uL (ref 4.0–10.5)

## 2014-11-15 LAB — COMPREHENSIVE METABOLIC PANEL
ALBUMIN: 4.4 g/dL (ref 3.5–5.2)
ALT: 10 U/L (ref 0–53)
AST: 17 U/L (ref 0–37)
Alkaline Phosphatase: 65 U/L (ref 39–117)
BILIRUBIN TOTAL: 0.8 mg/dL (ref 0.2–1.2)
BUN: 13 mg/dL (ref 6–23)
CALCIUM: 10.1 mg/dL (ref 8.4–10.5)
CO2: 28 meq/L (ref 19–32)
CREATININE: 0.88 mg/dL (ref 0.40–1.50)
Chloride: 103 mEq/L (ref 96–112)
GFR: 89.04 mL/min (ref >60.00)
Glucose, Bld: 97 mg/dL (ref 70–99)
Potassium: 4.6 mEq/L (ref 3.5–5.1)
Sodium: 138 mEq/L (ref 135–145)
Total Protein: 7.4 g/dL (ref 6.0–8.3)

## 2014-11-15 LAB — LIPID PANEL
CHOL/HDL RATIO: 3
Cholesterol: 198 mg/dL (ref 0–200)
HDL: 66.5 mg/dL (ref >39.00)
LDL Cholesterol: 119 mg/dL — ABNORMAL HIGH (ref 0–99)
NonHDL: 131.02
TRIGLYCERIDES: 62 mg/dL (ref 0.0–149.0)
VLDL: 12.4 mg/dL (ref 0.0–40.0)

## 2014-11-20 ENCOUNTER — Encounter: Payer: Self-pay | Admitting: Endocrinology

## 2014-11-20 ENCOUNTER — Ambulatory Visit (INDEPENDENT_AMBULATORY_CARE_PROVIDER_SITE_OTHER): Payer: Medicare Other | Admitting: Endocrinology

## 2014-11-20 VITALS — BP 171/85 | HR 66 | Temp 98.4°F | Resp 14 | Ht 72.0 in | Wt 195.8 lb

## 2014-11-20 DIAGNOSIS — G4700 Insomnia, unspecified: Secondary | ICD-10-CM

## 2014-11-20 DIAGNOSIS — Z23 Encounter for immunization: Secondary | ICD-10-CM

## 2014-11-20 MED ORDER — ZALEPLON 5 MG PO CAPS: 5.0000 mg | ORAL_CAPSULE | Freq: Every evening | ORAL | Status: DC | PRN

## 2014-11-20 NOTE — Patient Instructions (Signed)
MIRALAX daily

## 2014-11-20 NOTE — Progress Notes (Signed)
Subjective:     Patient ID: Phillip Shelton, male   DOB: [birth year], 80 y.o.   MRN: [id number]  Chief complaint:  Sleeping difficulty and constipation    HPI    since his hip surgery 2 months ago he has had difficulty sleeping at night.  He is not able to fall asleep for at least a couple of hours.  He try taking Ambien given by the surgeon but this causes follow-up with his balance and is too strong.   he has tried OTC sleep medications without relief   therapy is also complaining of CONSTIPATION since surgery.  He is going every 3-4 days only and is not getting relief with taking Colace and senna given by the surgeon.  He thinks he is getting some fiber in her diet  He has had lower urinary tract symptoms for several years with no specific etiology identified Has seen a urologist also in the past and probably was felt to have BPH  because of having problems with nocturia, slow urine stream he was tried on Rapaflo on his last visit and he thinks his symptoms are better.  he is getting up at night about 2-3 times ; he is having less difficulty with visitation and  [place] is somewhat better    Review of Systems    No recent weight loss  Wt Readings from Last 3 Encounters:  11/20/14 195 lb 12.8 oz (88.814 kg)  09/19/14 192 lb (87.091 kg)  09/12/14 192 lb (87.091 kg)   He says that Mobic does  Help his joint pains  He is still taking this for his pain in the feet  Only a couple of times has had a little discomfort in the lower legs at night   he is recovering well from his  Hip replacement surgery     Objective:   Physical Exam    No ankle edema            Lower urinary tract symptoms.   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

## 2014-12-20 ENCOUNTER — Other Ambulatory Visit: Payer: Self-pay | Admitting: Endocrinology

## 2015-04-17 ENCOUNTER — Other Ambulatory Visit (INDEPENDENT_AMBULATORY_CARE_PROVIDER_SITE_OTHER): Payer: Medicare Other

## 2015-04-17 ENCOUNTER — Other Ambulatory Visit: Payer: Self-pay | Admitting: *Deleted

## 2015-04-17 DIAGNOSIS — E7800 Pure hypercholesterolemia, unspecified: Secondary | ICD-10-CM | POA: Diagnosis not present

## 2015-04-17 LAB — URINALYSIS
Bilirubin Urine: NEGATIVE
Hgb urine dipstick: NEGATIVE
Ketones, ur: NEGATIVE
Leukocytes, UA: NEGATIVE
Nitrite: NEGATIVE
Specific Gravity, Urine: 1.01 (ref 1.000–1.030)
Total Protein, Urine: NEGATIVE
Urine Glucose: NEGATIVE
Urobilinogen, UA: 0.2 (ref 0.0–1.0)
pH: 7 (ref 5.0–8.0)

## 2015-04-17 LAB — LIPID PANEL
Cholesterol: 215 mg/dL — ABNORMAL HIGH (ref 0–200)
HDL: 65.6 mg/dL (ref >39.00)
LDL Cholesterol: 136 mg/dL — ABNORMAL HIGH (ref 0–99)
NonHDL: 149.15
Total CHOL/HDL Ratio: 3
Triglycerides: 68 mg/dL (ref 0.0–149.0)
VLDL: 13.6 mg/dL (ref 0.0–40.0)

## 2015-04-17 LAB — COMPREHENSIVE METABOLIC PANEL
ALBUMIN: 4.3 g/dL (ref 3.5–5.2)
ALK PHOS: 54 U/L (ref 39–117)
ALT: 10 U/L (ref 0–53)
AST: 16 U/L (ref 0–37)
BILIRUBIN TOTAL: 0.8 mg/dL (ref 0.2–1.2)
BUN: 14 mg/dL (ref 6–23)
CO2: 29 mEq/L (ref 19–32)
CREATININE: 0.95 mg/dL (ref 0.40–1.50)
Calcium: 9.7 mg/dL (ref 8.4–10.5)
Chloride: 104 mEq/L (ref 96–112)
GFR: 81.43 mL/min (ref >60.00)
GLUCOSE: 94 mg/dL (ref 70–99)
POTASSIUM: 5.1 meq/L (ref 3.5–5.1)
SODIUM: 138 meq/L (ref 135–145)
TOTAL PROTEIN: 7.5 g/dL (ref 6.0–8.3)

## 2015-04-17 LAB — CBC
HCT: 41.2 % (ref 39.0–52.0)
Hemoglobin: 14 g/dL (ref 13.0–17.0)
MCHC: 33.9 g/dL (ref 30.0–36.0)
MCV: 91.5 fl (ref 78.0–100.0)
PLATELETS: 227 10*3/uL (ref 150.0–400.0)
RBC: 4.5 Mil/uL (ref 4.22–5.81)
RDW: 15.3 % (ref 11.5–15.5)
WBC: 5.1 10*3/uL (ref 4.0–10.5)

## 2015-04-21 ENCOUNTER — Encounter: Payer: Self-pay | Admitting: Endocrinology

## 2015-04-21 ENCOUNTER — Ambulatory Visit (INDEPENDENT_AMBULATORY_CARE_PROVIDER_SITE_OTHER): Payer: Medicare Other | Admitting: Endocrinology

## 2015-04-21 VITALS — BP 122/72 | HR 66 | Temp 98.0°F | Resp 14 | Ht 72.0 in | Wt 200.0 lb

## 2015-04-21 DIAGNOSIS — R1310 Dysphagia, unspecified: Secondary | ICD-10-CM

## 2015-04-21 DIAGNOSIS — E7800 Pure hypercholesterolemia, unspecified: Secondary | ICD-10-CM | POA: Diagnosis not present

## 2015-04-21 DIAGNOSIS — N139 Obstructive and reflux uropathy, unspecified: Secondary | ICD-10-CM | POA: Diagnosis not present

## 2015-04-21 DIAGNOSIS — Z0000 Encounter for general adult medical examination without abnormal findings: Secondary | ICD-10-CM | POA: Diagnosis not present

## 2015-04-21 MED ORDER — OMEPRAZOLE 20 MG PO CPDR: 20.0000 mg | DELAYED_RELEASE_CAPSULE | Freq: Every day | ORAL | Status: DC

## 2015-04-21 NOTE — Progress Notes (Signed)
Phillip Shelton is a 81 y.o. male.    Chief complaint:  Difficulty swallowing  History of Present Illness:   1.   He has had some intermittent mild difficulty swallowing with the food appearing to be stuck in the lower chest area.  He says he started taking Prilosec every day this month and with this his symptoms have resolved.  Does not usually have heartburn.  Previously would take Prilosec only occasionally when he would have heartburn with spicy foods.  He did have a finding of a benign stricture in 2012 on his endoscopy   2. Hypercholesterolemia: He has had fairly consistent levels of cholesterol that are at the borderline high  level of about 130. Usually watching his saturated fat, usually not getting excessive dairy products and fatty meats.  has never been on medications   Lab Results  Component Value Date   CHOL 215* 04/17/2015   HDL 65.60 04/17/2015   LDLCALC 136* 04/17/2015   LDLDIRECT 133.0 12/22/2012   TRIG 68.0 04/17/2015   CHOLHDL 3 04/17/2015    3. He has had chronic foot deformity and For some time has been using special diabetic shoes for his Charcot foot from the orthopedic surgeon.  since he has had a tendon release surgery on the right leg he is not having pain.  He continues to take meloxicam regularly, has been followed by orthopedic doctor for this, less regularly now  4. He has had problems with frequent urination at night, now getting up about 2-3 times.  Does not have any frequency of urination during the day and also no complaints of hesitancy, slow stream or dribbling. No history of previous prostatitis or UTI. Has been benefiting from Rapaflo even though he did not find any reduction in nocturia from Flomax.  Urine stream is normal 5. History of pre-diabetes since 2009. Blood sugar levels have been minimally increased and have been normal since 2012.  Glucose now is 94 fasting.  Usually trying to moderate on portions and high carbohydrate  foods    PREVENTIVE CARE:  Fall risk screening done, has had no falls, does have some balance difficulties Depression screening done, negative Diet: Discussed low saturated fat diet  Vitamin D supplementation: Patient using supplement in cod liver oil Exercise: Patient encouraged to walk as much as possible, patient's mobility limited by balance difficulties Patient is compliant with regular eye exams and dentist visits Immunization status: Complete  Cancer screening: colonoscopy in March 2011 Colorectal screening: Stool Hemoccult 2016  Lab Results  Component Value Date   OCCULTBLD Negative 04/11/2014   PSA not indicated at his age     Medication List       This list is accurate as of: 04/21/15 12:08 PM.  Always use your most recent med list.               fish oil-omega-3 fatty acids 1000 MG capsule  Take 1 g by mouth at bedtime.     meloxicam 7.5 MG tablet  Commonly known as:  MOBIC  TAKE 1 TABLET (7.5 MG TOTAL) BY MOUTH DAILY.     omeprazole 20 MG capsule  Commonly known as:  PRILOSEC  Take 1 capsule (20 mg total) by mouth daily.     RAPAFLO 8 MG Caps capsule  Generic drug:  silodosin  TAKE 1 CAPSULE (8 MG TOTAL) BY MOUTH AT BEDTIME.        Allergies: No Known Allergies  Past Medical History  Diagnosis Date  .  Diverticulosis   . Hemorrhoids   . [place] tear 1981  . Arthritis   . Esophageal stricture   . Hiatal hernia   . GERD (gastroesophageal reflux disease)   . Complication of anesthesia   . PONV (postoperative nausea and vomiting)     Past Surgical History  Procedure Laterality Date  . Hemorrhoid surgery  1990  . Umbilical hernia repair    . Cholecystectomy    . Colonoscopy    . Upper gastrointestinal endoscopy    . Gastrocnemius recession  02/03/2011    Procedure: GASTROCNEMIUS SLIDE;  Surgeon: Colin Rhein, MD;  Location: [facility];  Service: Orthopedics;  Laterality: Right;  . Hand surgery  jan 2016    rt  hand  . Total hip arthroplasty Right 09/19/2014    Procedure: RIGHT TOTAL HIP ARTHROPLASTY ANTERIOR APPROACH;  Surgeon: Rod Can, MD;  Location: [facility];  Service: Orthopedics;  Laterality: Right;    Family History  Problem Relation Age of Onset  . Colon cancer Neg Hx   . Diabetes Neg Hx   . Heart disease Neg Hx   . Arthritis Father   . Hypertension Father     Social History:  reports that he quit smoking about 35 years ago. He has never used smokeless tobacco. He reports that he drinks alcohol. He reports that he does not use illicit drugs.  Review of Systems       He has had   mild weight loss, this is despite fairly good appetite.  He thinks he is just eating smaller portions.  Wt Readings from Last 3 Encounters:  04/21/15 200 lb (90.719 kg)  11/20/14 195 lb 12.8 oz (88.814 kg)  09/19/14 192 lb (87.091 kg)        Eyes: Normal vision, has had an examination every 2 years               No unusual headaches.     ENT:  has ear wax and had this cleaned by ENT surgeon q 12 months       Skin: No pigmentation or rash.        Thyroid:  No unusual fatigue.     No chest pain on exertion.                 No palpitations.     No history of hypertension: Previously had mild increase in systolic reading but could not tolerate antihypertensives.  He checks his blood pressure periodically and this recently was in 120s or 130s range     No leg pain on walking.                      No swelling of feet.     No shortness of breath on exertion. Had  cough  which resolved spontaneously.  Was not relieved by Prilosec with the last episode     Bowel habits: Usually regular.   Heartburn/pain:  dyphagia better with Rx  May rarely have reflux spicy food relieved by Prilosec       Rectal bleeding/black stools: Not present now, occ hemorrhoids       Has had some decrease in erections for several years. Has incomplete firmness and erections do not last long enough.  Currently not  concerned about this     Given Cialis 10 mg by urologist previously and has not used this for a long time.     In 2001 testosterone was  normal.     Does have more thickening of the tendon on the left hand also without interfering with his function.  Also has hammer toes        No numbness or tingling in feet; no weakness in limbs.     difficulty with balance on walking smooth surfd      He has no significant insomnia, no fatigue or depression.    EXAM:  BP 122/72 mmHg  Pulse 66  Temp(Src) 98 F (36.7 C)  Resp 14  Ht 6' (1.829 m)  Wt 200 lb (90.719 kg)  BMI 27.12 kg/m2  SpO2 96%  Physical Exam  Constitutional: He is oriented to person, place, and time. He appears well-developed and well-nourished.  HENT:  Mouth/Throat: Oropharynx is clear and moist.  Eyes: Conjunctivae are normal.  Fundii show normal discs and vessels  Neck: No thyromegaly present.  Cardiovascular: Normal rate, regular rhythm, normal heart sounds and intact distal pulses.  Exam reveals no gallop.   No murmur heard. Pulmonary/Chest: Breath sounds normal. He has no wheezes. He has no rales.  Mild hyperinflation of chest wall with liver dullness in the sixth space  Abdominal: Soft. He exhibits no distension, no pulsatile midline mass and no mass. There is no hepatosplenomegaly. There is no tenderness. No hernia.  No abnormal epigastric  pulsation  Genitourinary: Rectum normal.  Prostate mildly enlarged, about 1+,  smooth. No masses in rectal ampulla  Musculoskeletal: He exhibits no edema.       Lumbar back: He exhibits deformity.  He has marked deformity of the bony structure of the feet especially the right with deviation of the tarsal bones.  Mostly flat feet.  Also has significant hallux valgus of the toes especially right.  ? Ganglion cyst on the right lateral volar surface of the wrist Loss lumb lordosis  Lymphadenopathy:    He has no cervical adenopathy.  Neurological: He is alert and oriented to  person, place, and time. He displays no tremor.  Reflex Scores:      Bicep reflexes are 1+ on the right side and 0 on the left side.      Achilles reflexes are 0 on the right side and 0 on the left side. Romberg sign mildly positive Monofilament sensation normal on the toes bilaterally. Vibration sense minimal on Right only  Skin: Skin is warm and dry.  Psychiatric: He has a normal mood and affect.  Vitals reviewed.  Assessment/Plan:   1. Mild impaired fasting glucose of 101 in 2009, has been consistently normal subsequently 2. Nocturia, has had some BPH and also partly from overactive bladder.  He does appear to have benefited somewhat from using Rapaflo 3.  Hypercholesterolemia with previous LDL level 164.  His diet has been helping bringing it down to near the130 level. Since he has no other risk factors will not consider pharmacological treatment and continue to follow every 6 months 4.  DYSPHAGIA: He has had mild intermittent symptoms but he thinks he is better with taking Prilosec now.  Does have history of stricture and would recommend upper endoscopy.  Have also recommend that he try to taper off his meloxicam  5.  Mild asymptomatic sinus bradycardia with heart rate in the 60s, also has previous history of intraventricular conduction delay; stable 6.  Foot pain and deformities from Charcot foot on the right, relieved by surgical treatment of tendon release. Advised him to leave off meloxicam and take it only as needed to avoid reflux especially with  history of dysphagia and esophageal stricture 7. Erectile disfunction, mild and not desiring treatment at this time . Likely to be from atherosclerosis and history of smoking 8.  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 Also has mildly positive Romberg sign. Has mild chronic difficulty with balance.  He does not appear to have had any progressive change  9.   Hammertoes and hallux valgus bilaterally.  Reportedly has Charcot joint as reported by orthopedic surgeon.  Does have loss of vibration but not monofilament sensation on the right  10.  History of anemia after hip surgery: Resolved  11.  Probable ganglion cyst on the right wrist area, reassured him that this is asymptomatic and does not need excision now  PREVENTIVE care: He is due for a colonoscopy as suggested by gastroenterologist although he denies a family history of colon cancer He also needs follow-up upper endoscopy and most likely should get his colonoscopy done at the same time PSA testing not indicated at his age   [facility] 04/21/2015, 12:08 PM

## 2015-04-21 NOTE — Patient Instructions (Addendum)
Taper off Meloxicam  Have Colonoscopy scheduled  Take Prilosec daily in am

## 2015-04-24 ENCOUNTER — Encounter: Payer: Self-pay | Admitting: Gastroenterology

## 2015-05-11 ENCOUNTER — Other Ambulatory Visit: Payer: Self-pay | Admitting: Endocrinology

## 2015-06-12 ENCOUNTER — Ambulatory Visit (AMBULATORY_SURGERY_CENTER): Payer: Self-pay

## 2015-06-12 VITALS — Ht 73.0 in | Wt 201.0 lb

## 2015-06-12 DIAGNOSIS — Z1211 Encounter for screening for malignant neoplasm of colon: Secondary | ICD-10-CM

## 2015-06-12 DIAGNOSIS — Z800 Family history of malignant neoplasm of digestive organs: Secondary | ICD-10-CM

## 2015-06-12 MED ORDER — NA SULFATE-K SULFATE-MG SULF 17.5-3.13-1.6 GM/177ML PO SOLN: 1.0000 | Freq: Once | ORAL | Status: DC

## 2015-06-12 NOTE — Progress Notes (Signed)
No egg or soy allergy.  No previous complications from anesthesia. No home O2. No diet meds. 

## 2015-06-13 ENCOUNTER — Encounter: Payer: Self-pay | Admitting: Gastroenterology

## 2015-06-20 ENCOUNTER — Other Ambulatory Visit: Payer: Self-pay | Admitting: Endocrinology

## 2015-06-26 ENCOUNTER — Encounter: Payer: Self-pay | Admitting: Gastroenterology

## 2015-06-26 ENCOUNTER — Ambulatory Visit (AMBULATORY_SURGERY_CENTER): Payer: Medicare Other | Admitting: Gastroenterology

## 2015-06-26 VITALS — BP 116/69 | HR 55 | Temp 96.8°F | Resp 13 | Ht 72.0 in | Wt 200.0 lb

## 2015-06-26 DIAGNOSIS — Z1211 Encounter for screening for malignant neoplasm of colon: Secondary | ICD-10-CM

## 2015-06-26 DIAGNOSIS — Z800 Family history of malignant neoplasm of digestive organs: Secondary | ICD-10-CM

## 2015-06-26 MED ORDER — SODIUM CHLORIDE 0.9 % IV SOLN: 500.0000 mL | INTRAVENOUS | Status: DC

## 2015-06-26 NOTE — Patient Instructions (Signed)
YOU HAD AN ENDOSCOPIC PROCEDURE TODAY AT THE [facility]:   Refer to the procedure report that was given to you for any specific questions about what was found during the examination.  If the procedure report does not answer your questions, please call your gastroenterologist to clarify.  If you requested that your care partner not be given the details of your procedure findings, then the procedure report has been included in a sealed envelope for you to review at your convenience later.  YOU SHOULD EXPECT: Some feelings of bloating in the abdomen. Passage of more gas than usual.  Walking can help get rid of the air that was put into your GI tract during the procedure and reduce the bloating. If you had a lower endoscopy (such as a colonoscopy or flexible sigmoidoscopy) you may notice spotting of blood in your stool or on the toilet paper. If you underwent a bowel prep for your procedure, you may not have a normal bowel movement for a few days.  Please Note:  You might notice some irritation and congestion in your nose or some drainage.  This is from the oxygen used during your procedure.  There is no need for concern and it should clear up in a day or so.  SYMPTOMS TO REPORT IMMEDIATELY:   Following lower endoscopy (colonoscopy or flexible sigmoidoscopy):  Excessive amounts of blood in the stool  Significant tenderness or worsening of abdominal pains  Swelling of the abdomen that is new, acute  Fever of 100F or higher    For urgent or emergent issues, a gastroenterologist can be reached at any hour by calling [phone].   DIET: Your first meal following the procedure should be a small meal and then it is ok to progress to your normal diet. Heavy or fried foods are harder to digest and may make you feel nauseous or bloated.  Likewise, meals heavy in dairy and vegetables can increase bloating.  Drink plenty of fluids but you should avoid alcoholic beverages for 24  hours.  ACTIVITY:  You should plan to take it easy for the rest of today and you should NOT DRIVE or use heavy machinery until tomorrow (because of the sedation medicines used during the test).    FOLLOW UP: Our staff will call the number listed on your records the next business day following your procedure to check on you and address any questions or concerns that you may have regarding the information given to you following your procedure. If we do not reach you, we will leave a message.  However, if you are feeling well and you are not experiencing any problems, there is no need to return our call.  We will assume that you have returned to your regular daily activities without incident.  If any biopsies were taken you will be contacted by phone or by letter within the next 1-3 weeks.  Please call us at [phone] if you have not heard about the biopsies in 3 weeks.    SIGNATURES/CONFIDENTIALITY: You and/or your care partner have signed paperwork which will be entered into your electronic medical record.  These signatures attest to the fact that that the information above on your After Visit Summary has been reviewed and is understood.  Full responsibility of the confidentiality of this discharge information lies with you and/or your care-partner.   Resume medications. Information given on diverticulosis,hemorrhoids and high fiber diet. 

## 2015-06-26 NOTE — Progress Notes (Signed)
Report to PACU, RN, vss, BBS= Clear.  

## 2015-06-26 NOTE — Op Note (Addendum)
[facility] Patient Name: Phillip Shelton Procedure Date: 06/26/2015 7:32 AM MRN: [id number] Endoscopist: Ladene Artist , MD Age: 81 Referring MD:  Date of Birth: 02/04/1934 Gender: Male Account #: [id number] Procedure:                Colonoscopy Indications:              Screening in patient at increased risk: Family                            history of 1st-degree relative with colorectal                            cancer Medicines:                Monitored Anesthesia Care Procedure:                Pre-Anesthesia Assessment:                           - Prior to the procedure, a History and Physical                            was performed, and patient medications and                            allergies were reviewed. The patient's tolerance of                            previous anesthesia was also reviewed. The risks                            and benefits of the procedure and the sedation                            options and risks were discussed with the patient.                            All questions were answered, and informed consent                            was obtained. Prior Anticoagulants: The patient has                            taken no previous anticoagulant or antiplatelet                            agents. ASA Grade Assessment: II - A patient with                            mild systemic disease. After reviewing the risks                            and benefits, the patient was deemed in  satisfactory condition to undergo the procedure.                           After obtaining informed consent, the colonoscope                            was passed under direct vision. Throughout the                            procedure, the patient's blood pressure, pulse, and                            oxygen saturations were monitored continuously. The                            Model PCF-H190DL [id number]) scope was introduced                      through the anus and advanced to the the cecum,                            identified by appendiceal orifice and ileocecal                            valve. The colonoscopy was performed without                            difficulty. The patient tolerated the procedure                            well. The quality of the bowel preparation was                            good. The ileocecal valve, appendiceal orifice, and                            rectum were photographed. Scope In: 8:13:16 AM Scope Out: 8:29:06 AM Scope Withdrawal Time: 0 hours 10 minutes 46 seconds  Total Procedure Duration: 0 hours 15 minutes 50 seconds  Findings:                 The digital rectal exam was normal.                           Many medium-mouthed diverticula were found in the                            sigmoid colon. There was evidence of diverticular                            spasm.                           Internal hemorrhoids were found during  retroflexion. The hemorrhoids were medium-sized and                            Grade I (internal hemorrhoids that do not prolapse).                           The exam was otherwise normal throughout the                            examined colon. Complications:            No immediate complications. Estimated Blood Loss:     Estimated blood loss: none. Impression:               - Moderate diverticulosis in the sigmoid colon.                           - Internal hemorrhoids. Recommendation:           - Patient has a contact number available for                            emergencies. The signs and symptoms of potential                            delayed complications were discussed with the                            patient. Return to normal activities tomorrow.                            Written discharge instructions were provided to the                            patient.                           - High fiber  diet indefinitely.                           - Continue present medications.                           - No repeat colonoscopy due to age. Ladene Artist, MD 06/26/2015 8:35:34 AM This report has been signed electronically.

## 2015-06-27 ENCOUNTER — Telehealth: Payer: Self-pay | Admitting: *Deleted

## 2015-06-27 NOTE — Telephone Encounter (Signed)
  Follow up Call-  Call back number 06/26/2015  Post procedure Call Back phone  # [phone]  Permission to leave phone message Yes     Patient questions:  Do you have a fever, pain , or abdominal swelling? No. Pain Score  0 *  Have you tolerated food without any problems? Yes.    Have you been able to return to your normal activities? Yes.    Do you have any questions about your discharge instructions: Diet   No. Medications  No. Follow up visit  No.  Do you have questions or concerns about your Care? No.  Actions: * If pain score is 4 or above: No action needed, pain <4.

## 2015-10-12 ENCOUNTER — Other Ambulatory Visit: Payer: Self-pay | Admitting: Endocrinology

## 2015-10-16 ENCOUNTER — Other Ambulatory Visit: Payer: Self-pay | Admitting: *Deleted

## 2015-10-16 ENCOUNTER — Other Ambulatory Visit (INDEPENDENT_AMBULATORY_CARE_PROVIDER_SITE_OTHER): Payer: Medicare Other

## 2015-10-16 DIAGNOSIS — E7800 Pure hypercholesterolemia, unspecified: Secondary | ICD-10-CM

## 2015-10-16 LAB — LIPID PANEL
CHOL/HDL RATIO: 3
Cholesterol: 216 mg/dL — ABNORMAL HIGH (ref 0–200)
HDL: 63.2 mg/dL (ref >39.00)
LDL CALC: 137 mg/dL — AB (ref 0–99)
NONHDL: 152.37
Triglycerides: 79 mg/dL (ref 0.0–149.0)
VLDL: 15.8 mg/dL (ref 0.0–40.0)

## 2015-10-16 LAB — URINALYSIS
BILIRUBIN URINE: NEGATIVE
HGB URINE DIPSTICK: NEGATIVE
Ketones, ur: NEGATIVE
LEUKOCYTES UA: NEGATIVE
NITRITE: NEGATIVE
Specific Gravity, Urine: <=1.005 — AB (ref 1.000–1.030)
TOTAL PROTEIN, URINE-UPE24: NEGATIVE
UROBILINOGEN UA: 0.2 (ref 0.0–1.0)
Urine Glucose: NEGATIVE
pH: 5.5 (ref 5.0–8.0)

## 2015-10-16 LAB — CBC
HCT: 36.8 % — ABNORMAL LOW (ref 39.0–52.0)
Hemoglobin: 12.2 g/dL — ABNORMAL LOW (ref 13.0–17.0)
MCHC: 33.2 g/dL (ref 30.0–36.0)
MCV: 89.3 fl (ref 78.0–100.0)
Platelets: 235 10*3/uL (ref 150.0–400.0)
RBC: 4.13 Mil/uL — AB (ref 4.22–5.81)
RDW: 14.3 % (ref 11.5–15.5)
WBC: 5.5 10*3/uL (ref 4.0–10.5)

## 2015-10-16 LAB — COMPREHENSIVE METABOLIC PANEL
ALT: 10 U/L (ref 0–53)
AST: 14 U/L (ref 0–37)
Albumin: 4.1 g/dL (ref 3.5–5.2)
Alkaline Phosphatase: 54 U/L (ref 39–117)
BILIRUBIN TOTAL: 0.6 mg/dL (ref 0.2–1.2)
BUN: 14 mg/dL (ref 6–23)
CO2: 27 meq/L (ref 19–32)
Calcium: 9.4 mg/dL (ref 8.4–10.5)
Chloride: 105 mEq/L (ref 96–112)
Creatinine, Ser: 0.96 mg/dL (ref 0.40–1.50)
GFR: 80.35 mL/min (ref >60.00)
GLUCOSE: 92 mg/dL (ref 70–99)
Potassium: 4.5 mEq/L (ref 3.5–5.1)
SODIUM: 137 meq/L (ref 135–145)
Total Protein: 7.3 g/dL (ref 6.0–8.3)

## 2015-10-21 ENCOUNTER — Encounter: Payer: Self-pay | Admitting: Endocrinology

## 2015-10-21 ENCOUNTER — Ambulatory Visit (INDEPENDENT_AMBULATORY_CARE_PROVIDER_SITE_OTHER): Payer: Medicare Other | Admitting: Endocrinology

## 2015-10-21 VITALS — BP 132/68 | HR 66 | Temp 98.1°F | Resp 14 | Ht 72.0 in | Wt 202.6 lb

## 2015-10-21 DIAGNOSIS — E7800 Pure hypercholesterolemia, unspecified: Secondary | ICD-10-CM | POA: Diagnosis not present

## 2015-10-21 DIAGNOSIS — D508 Other iron deficiency anemias: Secondary | ICD-10-CM | POA: Diagnosis not present

## 2015-10-21 DIAGNOSIS — Z23 Encounter for immunization: Secondary | ICD-10-CM

## 2015-10-21 NOTE — Progress Notes (Signed)
Subjective:     Patient ID: Phillip Shelton, male   DOB: Jun 29, 1934, 81 y.o.   MRN: [id number]  HPI  Chief complaint: Follow-up of her use problem  ANEMIA:  He had anemia after his hip surgery but this recovered with iron supplementation Again his hemoglobin is low.  Apparently he gave blood a month or 2 ago and he does this every 6 months He did have a normal colonoscopy in June No fatigue  Lab Results  Component Value Date   WBC 5.5 10/16/2015   HGB 12.2 (L) 10/16/2015   HCT 36.8 (L) 10/16/2015   MCV 89.3 10/16/2015   PLT 235.0 10/16/2015     DYSPHAGIA: On his last visit he was having some difficulty swallowing but he thinks this is better with taking omeprazole He takes this now only 4-5 days a week especially with spicy food No recent reflux symptoms   BPH: He has reportedly some improvement of his urinary symptoms including nocturia and slower stream with Rapaflo and likes to continue this   HYPERCHOLESTEROLEMIA: This has been mild and controlled with diet, highest LDL 164  Lab Results  Component Value Date   CHOL 216 (H) 10/16/2015   HDL 63.20 10/16/2015   LDLCALC 137 (H) 10/16/2015   LDLDIRECT 133.0 12/22/2012   TRIG 79.0 10/16/2015   CHOLHDL 3 10/16/2015      Medication List       Accurate as of 10/21/15 10:03 AM. Always use your most recent med list.          fish oil-omega-3 fatty acids 1000 MG capsule Take 1 g by mouth at bedtime.   meloxicam 7.5 MG tablet Commonly known as:  MOBIC TAKE 1 TABLET (7.5 MG TOTAL) BY MOUTH DAILY.   omeprazole 20 MG capsule Commonly known as:  PRILOSEC TAKE 1 CAPSULE (20 MG TOTAL) BY MOUTH DAILY.   RAPAFLO 8 MG Caps capsule Generic drug:  silodosin TAKE 1 CAPSULE (8 MG TOTAL) BY MOUTH AT BEDTIME.      Lab on 10/16/2015  Component Date Value Ref Range Status  . Cholesterol 10/16/2015 216* 0 - 200 mg/dL Final  . Triglycerides 10/16/2015 79.0  0.0 - 149.0 mg/dL Final  . HDL 10/16/2015 63.20  >39.00 mg/dL  Final  . VLDL 10/16/2015 15.8  0.0 - 40.0 mg/dL Final  . LDL Cholesterol 10/16/2015 137* 0 - 99 mg/dL Final  . Total CHOL/HDL Ratio 10/16/2015 3   Final  . NonHDL 10/16/2015 152.37   Final  . WBC 10/16/2015 5.5  4.0 - 10.5 K/uL Final  . RBC 10/16/2015 4.13* 4.22 - 5.81 Mil/uL Final  . Platelets 10/16/2015 235.0  150.0 - 400.0 K/uL Final  . Hemoglobin 10/16/2015 12.2* 13.0 - 17.0 g/dL Final  . HCT 10/16/2015 36.8* 39.0 - 52.0 % Final  . MCV 10/16/2015 89.3  78.0 - 100.0 fl Final  . MCHC 10/16/2015 33.2  30.0 - 36.0 g/dL Final  . RDW 10/16/2015 14.3  11.5 - 15.5 % Final  . Color, Urine 10/16/2015 YELLOW  Yellow;Lt. Yellow Final  . APPearance 10/16/2015 CLEAR  Clear Final  . Specific Gravity, Urine 10/16/2015 <=1.005* 1.000 - 1.030 Final  . pH 10/16/2015 5.5  5.0 - 8.0 Final  . Total Protein, Urine 10/16/2015 NEGATIVE  Negative Final  . Urine Glucose 10/16/2015 NEGATIVE  Negative Final  . Ketones, ur 10/16/2015 NEGATIVE  Negative Final  . Bilirubin Urine 10/16/2015 NEGATIVE  Negative Final  . Hgb urine dipstick 10/16/2015 NEGATIVE  Negative Final  .  Urobilinogen, UA 10/16/2015 0.2  0.0 - 1.0 Final  . Leukocytes, UA 10/16/2015 NEGATIVE  Negative Final  . Nitrite 10/16/2015 NEGATIVE  Negative Final  . Sodium 10/16/2015 137  135 - 145 mEq/L Final  . Potassium 10/16/2015 4.5  3.5 - 5.1 mEq/L Final  . Chloride 10/16/2015 105  96 - 112 mEq/L Final  . CO2 10/16/2015 27  19 - 32 mEq/L Final  . Glucose, Bld 10/16/2015 92  70 - 99 mg/dL Final  . BUN 10/16/2015 14  6 - 23 mg/dL Final  . Creatinine, Ser 10/16/2015 0.96  0.40 - 1.50 mg/dL Final  . Total Bilirubin 10/16/2015 0.6  0.2 - 1.2 mg/dL Final  . Alkaline Phosphatase 10/16/2015 54  39 - 117 U/L Final  . AST 10/16/2015 14  0 - 37 U/L Final  . ALT 10/16/2015 10  0 - 53 U/L Final  . Total Protein 10/16/2015 7.3  6.0 - 8.3 g/dL Final  . Albumin 10/16/2015 4.1  3.5 - 5.2 g/dL Final  . Calcium 10/16/2015 9.4  8.4 - 10.5 mg/dL Final  . GFR  10/16/2015 80.35  >60.00 mL/min Final     Review of Systems  Genitourinary: Negative for urgency.       Objective:   Physical Exam  BP 132/68   Pulse 66   Temp 98.1 F (36.7 C)   Resp 14   Ht 6' (1.829 m)   Wt 202 lb 9.6 oz (91.9 kg)   SpO2 98%   BMI 27.48 kg/m      Assessment:     Anemia, likely to be resulting from our deficiency and recent blood donation  Other problems as discussed above which are stable     Plan:     Continue same medications He will take OTC iron with ferrous sulfate daily for a month and come back for follow-up hemoglobin  General physical exam to be done next April again  High-dose influenza vaccine given  [facility] 10/21/15

## 2015-10-21 NOTE — Patient Instructions (Signed)
Restart iron for 30 days

## 2015-10-24 ENCOUNTER — Other Ambulatory Visit: Payer: Self-pay | Admitting: Endocrinology

## 2015-11-21 ENCOUNTER — Other Ambulatory Visit (INDEPENDENT_AMBULATORY_CARE_PROVIDER_SITE_OTHER): Payer: Medicare Other

## 2015-11-21 DIAGNOSIS — D508 Other iron deficiency anemias: Secondary | ICD-10-CM

## 2015-11-21 LAB — CBC
HEMATOCRIT: 39.3 % (ref 39.0–52.0)
Hemoglobin: 12.9 g/dL — ABNORMAL LOW (ref 13.0–17.0)
MCHC: 33 g/dL (ref 30.0–36.0)
MCV: 90.7 fl (ref 78.0–100.0)
Platelets: 210 10*3/uL (ref 150.0–400.0)
RBC: 4.33 Mil/uL (ref 4.22–5.81)
RDW: 15.8 % — AB (ref 11.5–15.5)
WBC: 5.6 10*3/uL (ref 4.0–10.5)

## 2015-11-21 LAB — IBC PANEL
IRON: 119 ug/dL (ref 42–165)
Saturation Ratios: 28.4 % (ref 20.0–50.0)
TRANSFERRIN: 299 mg/dL (ref 212.0–360.0)

## 2015-12-01 ENCOUNTER — Telehealth: Payer: Self-pay | Admitting: Endocrinology

## 2015-12-01 NOTE — Telephone Encounter (Signed)
Patient is calling on the status of his handicap sticker.

## 2015-12-02 NOTE — Telephone Encounter (Signed)
Patient stated that he dropped of his form to be filled concerning his hjandicap sticker it Runs out in dec, please advise as soon as possible

## 2015-12-03 NOTE — Telephone Encounter (Signed)
Pt called in to check on status of handicap paperwork.

## 2015-12-04 ENCOUNTER — Encounter: Payer: Self-pay | Admitting: Endocrinology

## 2015-12-04 ENCOUNTER — Encounter: Payer: Medicare Other | Admitting: Endocrinology

## 2015-12-04 NOTE — Progress Notes (Signed)
This encounter was created in error - please disregard.

## 2015-12-04 NOTE — Telephone Encounter (Signed)
Patient received paper work 12/04/15

## 2016-02-07 ENCOUNTER — Other Ambulatory Visit: Payer: Self-pay | Admitting: Endocrinology

## 2016-03-07 ENCOUNTER — Other Ambulatory Visit: Payer: Self-pay | Admitting: Endocrinology

## 2016-04-16 ENCOUNTER — Other Ambulatory Visit (INDEPENDENT_AMBULATORY_CARE_PROVIDER_SITE_OTHER): Payer: Medicare Other

## 2016-04-16 ENCOUNTER — Other Ambulatory Visit: Payer: Self-pay | Admitting: Endocrinology

## 2016-04-16 DIAGNOSIS — D508 Other iron deficiency anemias: Secondary | ICD-10-CM

## 2016-04-16 DIAGNOSIS — N139 Obstructive and reflux uropathy, unspecified: Secondary | ICD-10-CM | POA: Diagnosis not present

## 2016-04-16 DIAGNOSIS — E7800 Pure hypercholesterolemia, unspecified: Secondary | ICD-10-CM

## 2016-04-16 LAB — CBC WITH DIFFERENTIAL/PLATELET
BASOS PCT: 0.6 % (ref 0.0–3.0)
Basophils Absolute: 0 10*3/uL (ref 0.0–0.1)
EOS PCT: 2.4 % (ref 0.0–5.0)
Eosinophils Absolute: 0.1 10*3/uL (ref 0.0–0.7)
HEMATOCRIT: 43.8 % (ref 39.0–52.0)
HEMOGLOBIN: 14.6 g/dL (ref 13.0–17.0)
Lymphocytes Relative: 33.6 % (ref 12.0–46.0)
Lymphs Abs: 1.8 10*3/uL (ref 0.7–4.0)
MCHC: 33.2 g/dL (ref 30.0–36.0)
MCV: 95 fl (ref 78.0–100.0)
MONOS PCT: 14.6 % — AB (ref 3.0–12.0)
Monocytes Absolute: 0.8 10*3/uL (ref 0.1–1.0)
Neutro Abs: 2.7 10*3/uL (ref 1.4–7.7)
Neutrophils Relative %: 48.8 % (ref 43.0–77.0)
Platelets: 226 10*3/uL (ref 150.0–400.0)
RBC: 4.62 Mil/uL (ref 4.22–5.81)
RDW: 14.5 % (ref 11.5–15.5)
WBC: 5.4 10*3/uL (ref 4.0–10.5)

## 2016-04-16 LAB — URINALYSIS, ROUTINE W REFLEX MICROSCOPIC
Bilirubin Urine: NEGATIVE
HGB URINE DIPSTICK: NEGATIVE
Ketones, ur: NEGATIVE
Leukocytes, UA: NEGATIVE
NITRITE: NEGATIVE
RBC / HPF: NONE SEEN (ref 0–?)
Specific Gravity, Urine: 1.01 (ref 1.000–1.030)
Total Protein, Urine: NEGATIVE
URINE GLUCOSE: NEGATIVE
Urobilinogen, UA: 0.2 (ref 0.0–1.0)
WBC UA: NONE SEEN (ref 0–?)
pH: 6 (ref 5.0–8.0)

## 2016-04-16 LAB — COMPREHENSIVE METABOLIC PANEL
ALK PHOS: 48 U/L (ref 39–117)
ALT: 12 U/L (ref 0–53)
AST: 16 U/L (ref 0–37)
Albumin: 4.3 g/dL (ref 3.5–5.2)
BUN: 16 mg/dL (ref 6–23)
CHLORIDE: 104 meq/L (ref 96–112)
CO2: 29 mEq/L (ref 19–32)
Calcium: 9.7 mg/dL (ref 8.4–10.5)
Creatinine, Ser: 0.93 mg/dL (ref 0.40–1.50)
GFR: 83.24 mL/min (ref >60.00)
Glucose, Bld: 99 mg/dL (ref 70–99)
POTASSIUM: 4.9 meq/L (ref 3.5–5.1)
Sodium: 137 mEq/L (ref 135–145)
TOTAL PROTEIN: 7.2 g/dL (ref 6.0–8.3)
Total Bilirubin: 0.9 mg/dL (ref 0.2–1.2)

## 2016-04-16 LAB — LIPID PANEL
CHOLESTEROL: 218 mg/dL — AB (ref 0–200)
HDL: 59.9 mg/dL (ref >39.00)
LDL CALC: 142 mg/dL — AB (ref 0–99)
NonHDL: 158.41
Total CHOL/HDL Ratio: 4
Triglycerides: 80 mg/dL (ref 0.0–149.0)
VLDL: 16 mg/dL (ref 0.0–40.0)

## 2016-04-19 ENCOUNTER — Telehealth (INDEPENDENT_AMBULATORY_CARE_PROVIDER_SITE_OTHER): Payer: Self-pay | Admitting: *Deleted

## 2016-04-19 NOTE — Telephone Encounter (Signed)
Patient called in this afternoon in regards to needing a prescription please for a new shoe from [employer]. They are diabetic shoes and he is just needing a new prescription please if possible. His CB # [phone]. Thank you

## 2016-04-20 ENCOUNTER — Encounter: Payer: Self-pay | Admitting: Endocrinology

## 2016-04-20 ENCOUNTER — Ambulatory Visit (INDEPENDENT_AMBULATORY_CARE_PROVIDER_SITE_OTHER): Payer: Medicare Other | Admitting: Endocrinology

## 2016-04-20 VITALS — BP 126/74 | HR 67 | Ht 72.0 in | Wt 199.0 lb

## 2016-04-20 DIAGNOSIS — Z23 Encounter for immunization: Secondary | ICD-10-CM

## 2016-04-20 DIAGNOSIS — Z0000 Encounter for general adult medical examination without abnormal findings: Secondary | ICD-10-CM

## 2016-04-20 DIAGNOSIS — M14671 Charcot's joint, right ankle and foot: Secondary | ICD-10-CM | POA: Diagnosis not present

## 2016-04-20 DIAGNOSIS — R001 Bradycardia, unspecified: Secondary | ICD-10-CM | POA: Diagnosis not present

## 2016-04-20 DIAGNOSIS — N139 Obstructive and reflux uropathy, unspecified: Secondary | ICD-10-CM

## 2016-04-20 DIAGNOSIS — E7800 Pure hypercholesterolemia, unspecified: Secondary | ICD-10-CM | POA: Diagnosis not present

## 2016-04-20 NOTE — Patient Instructions (Signed)
Make sure you're take the MELOXICAM AFTER the meal, not before  Try to take OMEPRAZOLE at least twice a week since you are taking meloxicam regularly  Avoid high-fat dairy products and meats  Regular dental and eye exams as recommended by the specialists  Make sure smoke detectors are functional at home Wear seat belts all the time  Call if blood pressure is consistently over 150

## 2016-04-20 NOTE — Progress Notes (Signed)
Phillip Shelton is a 82 y.o. male.    Chief complaint:  Routine general exam and follow-up of chronic problems   History of Present Illness:  Chronic problems addressed today are as follows:  1.  He has had chronic foot deformities especially on the right side followed by orthopedic surgeon; has been using special diabetic shoes for his Charcot foot.  Since he has had a tendon release surgery on the right leg he is not having much pain.  He continues to take meloxicam regularly, has been followed by orthopedic doctor recently   2. Hypercholesterolemia: He has had mildly increased levels of cholesterol that are at the mildly about target  level of  130. Usually watching his saturated fat, sometimes will have sweets but usually not getting excessive dairy products and fatty meats.  He  has never been on medications   Lab Results  Component Value Date   CHOL 218 (H) 04/16/2016   HDL 59.90 04/16/2016   LDLCALC 142 (H) 04/16/2016   LDLDIRECT 133.0 12/22/2012   TRIG 80.0 04/16/2016   CHOLHDL 4 04/16/2016    3.  REFLUX:  He will take Prilosec only occasionally when he would have heartburn with spicy foods.  This maybe about once a week or so.  He did have a finding of a benign stricture in 2012 on his endoscopy which was treated but upper endoscopy not repeated last year when he had his colonoscopy.  No history of Barrett's esophagus   4.  LOWER URINARY TRACT symptoms: He has had problems with frequent urination at night,  getting up about 2-3 times.  Does not have any frequency of urination during the day and also no complaints of hesitancy, has some slow stream chronically present but no dribbling.  Has been benefiting from Rapaflo even though he did not find any reduction in nocturia from Flomax.  No history of previous prostatitis or UTI.   5.  History of pre-diabetes since 2009. Blood sugar levels have been minimally increased and have been normal since 2012.  Glucose now  is 99 fasting.  Usually trying to moderate on portions and high carbohydrate foods   PREVENTIVE CARE:  Fall risk screening done, has had no falls, does have some balance difficulties Depression screening done, negative Diet: Discussed low saturated fat diet  Vitamin D supplementation: Patient using supplement in cod liver oil  Exercise: Patient encouraged to walk as much as possible, patient's mobility limited by balance difficulties Patient is compliant with regular eye exams and dentist visits Immunization status: Needs Pneumovax booster  Cancer screening: colonoscopy in 6/17 Colorectal screening: Stool Hemoccult 2016  Lab Results  Component Value Date   OCCULTBLD Negative 04/11/2014   PSA not indicated at his age   Allergies as of 04/20/2016   No Known Allergies     Medication List       Accurate as of 04/20/16 10:06 AM. Always use your most recent med list.          fish oil-omega-3 fatty acids 1000 MG capsule Take 1 g by mouth at bedtime.   meloxicam 7.5 MG tablet Commonly known as:  MOBIC TAKE 1 TABLET (7.5 MG TOTAL) BY MOUTH DAILY.   omeprazole 20 MG capsule Commonly known as:  PRILOSEC TAKE 1 CAPSULE (20 MG TOTAL) BY MOUTH DAILY.   RAPAFLO 8 MG Caps capsule Generic drug:  silodosin TAKE 1 CAPSULE (8 MG TOTAL) BY MOUTH AT BEDTIME.       Allergies: No  Known Allergies  Past Medical History:  Diagnosis Date  . Arthritis   . Complication of anesthesia   . Diverticulosis   . Esophageal stricture   . GERD (gastroesophageal reflux disease)   . Hemorrhoids   . Hiatal hernia   . [place] tear 1981  . PONV (postoperative nausea and vomiting)     Past Surgical History:  Procedure Laterality Date  . CHOLECYSTECTOMY    . COLONOSCOPY    . GASTROCNEMIUS RECESSION  02/03/2011   Procedure: GASTROCNEMIUS SLIDE;  Surgeon: Colin Rhein, MD;  Location: [facility];  Service: Orthopedics;  Laterality: Right;  . HAND SURGERY  jan 2016     rt hand  . [place]  . TOTAL HIP ARTHROPLASTY Right 09/19/2014   Procedure: RIGHT TOTAL HIP ARTHROPLASTY ANTERIOR APPROACH;  Surgeon: Rod Can, MD;  Location: [facility];  Service: Orthopedics;  Laterality: Right;  . UMBILICAL HERNIA REPAIR    . UPPER GASTROINTESTINAL ENDOSCOPY      Family History  Problem Relation Age of Onset  . Diabetes Neg Hx   . Heart disease Neg Hx   . Stomach cancer Neg Hx   . Rectal cancer Neg Hx   . Arthritis Father   . Hypertension Father   . Colon cancer Maternal Uncle     Social History:  reports that he quit smoking about 36 years ago. He has never used smokeless tobacco. He reports that he drinks alcohol. He reports that he does not use drugs.  Review of Systems   No recent weight change:  Wt Readings from Last 3 Encounters:  04/20/16 199 lb (90.3 kg)  10/21/15 202 lb 9.6 oz (91.9 kg)  06/26/15 200 lb (90.7 kg)       Eyes: Normal vision, has had an examination every 2 years               No unusual headaches.     ENT:  has ear wax and had this cleaned by ENT surgeon Annually or as needed     Skin: No abnormal pigmentation or rash.        Thyroid:  No unusual fatigue.     No chest pain on exertion.                 No palpitations.      Blood pressure: Previously had mild increase in systolic reading but could not tolerate antihypertensives.  He checks his blood pressure periodically and this recently is in 161W or 960A systolic range     No calf pain on walking.                      No swelling of feet.     No shortness of breath on exertion.     Bowel habits: Usually regular.        Rectal bleeding/black stools: Not present now, previously had hemorrhoids       Has had some decrease in erections for several years. Has incomplete firmness and erections do not last long enough.  Again not concerned about this     Given Cialis 10 mg by urologist previously and has not used this for a long time.     In 2001  testosterone was normal.    JOINTS: Does have  thickening of the tendon on the left hand also without interfering with his function.  Also has hammer toes   Occasionally has back pain on bending  but does not last long      No numbness or tingling in feet; no weakness in limbs.      Has mild difficulty with balance which she thinks is from his foot deformities        He has no insomnia    EXAM:  BP (!) 150/68   Pulse 67   Ht 6' (1.829 m)   Wt 199 lb (90.3 kg)   BMI 26.99 kg/m   Physical Exam  Constitutional: He is oriented to person, place, and time. He appears well-developed and well-nourished.  HENT:  Mouth/Throat: Oropharynx is clear and moist.  Tongue appears normal  Eyes: Conjunctivae are normal.  Fundii show normal discs and vessels  Neck: No thyromegaly present.  Cardiovascular: Normal rate, regular rhythm, normal heart sounds and intact distal pulses.  Exam reveals no gallop.   No murmur heard. Pulmonary/Chest: Breath sounds normal. He has no wheezes. He has no rales.  Mild hyperinflation of chest wall with liver dullness in the sixth space  Abdominal: Soft. He exhibits no distension, no pulsatile midline mass and no mass. There is no hepatosplenomegaly. There is no tenderness.  No abnormal epigastric  pulsation  Genitourinary: Rectum normal.  Genitourinary Comments: Prostate mildly enlarged, 1+,  smooth.  No nodules felt  Musculoskeletal: He exhibits no edema.       Lumbar back: He exhibits deformity.  He has marked deformity of the bony structure of the feet especially the right with deformity of the tarsal bones.  Nearly flat feet.  Also has significant hallux valgus of the toes especially right.   Ganglion cyst on the right lateral volar surface of the wrist Mild scoliosis and loss of lumbar lordosis  Feet:  Left Foot:  Skin Integrity: Negative for ulcer.  Lymphadenopathy:    He has no cervical adenopathy.  Neurological: He is alert and oriented to person,  place, and time. He displays no tremor.  Reflex Scores:      Bicep reflexes are 1+ on the right side and 0 on the left side.      Achilles reflexes are 0 on the right side and 0 on the left side. Skin: Skin is warm and dry. No rash noted.  Psychiatric: He has a normal mood and affect.  Vitals reviewed.  Assessment/Plan:   1. Mild impaired fasting glucose of 101 in 2009, glucose now borderline at 99  He has lost weight over the last couple of years and he usually eating a healthy diet, continue to monitor  2. Nocturia, relatively weak stream may be from mild BPH and also partly from overactive bladder.  He has benefited somewhat from using Rapaflo and can continue, exam unchanged today   3.  Hypercholesterolemia with previous LDL level 164.  His LDL is still not at ideal levels but since he has no other risk factors or evidence of any vascular disease is not a candidate for statin drug treatment at this time Discussed reducing saturated fats consistently  4.  REFLUX: He has had mild intermittent symptoms controlled with Prilosec as needed Since he is taking Mobic and has a previous history of stricture recommend taking Prilosec at least twice a week  5.  Mild asymptomatic sinus bradycardia with heart rate in the 60s, also has previous history of intraventricular conduction delay; EKG to be repeated today  6.  Foot pain and deformities from Charcot foot on the right, relieved by surgical treatment of tendon release. 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   History of anemia  iron deficiency after hip surgery: Hemoglobin back to normal now, may donate blood once a year  8.  Unchanged ganglion cyst on the right wrist area  9.  History of high normal systolic blood pressure: This is normal in the office today  even though he reports recently high readings at home, no indication for treatment  PREVENTIVE care: Pneumovax booster today Annual influenza vaccine Follow-up in one year for retesting lipids  Loucile Posner 04/20/2016, 10:06 AM

## 2016-04-21 NOTE — Telephone Encounter (Signed)
Faxed to [facility] this morning, patient aware.

## 2016-04-22 ENCOUNTER — Telehealth (INDEPENDENT_AMBULATORY_CARE_PROVIDER_SITE_OTHER): Payer: Self-pay | Admitting: Orthopedic Surgery

## 2016-04-22 NOTE — Telephone Encounter (Signed)
Patient called advised he just called [facility] and they have not received the Rx for his Diabetic shoes yet. Patient asked for a call back as soon as possible. The number to contact patient is [phone]

## 2016-04-22 NOTE — Telephone Encounter (Signed)
Patient called asking for the RX for his diabetic shoes to be faxed over to [facility]. Thank you. CB # [phone]

## 2016-04-22 NOTE — Telephone Encounter (Signed)
Pt aware that this has been done

## 2016-04-23 NOTE — Telephone Encounter (Signed)
Called pt and advised that we have faxed the rx to [facility] several times. We are going to put the rx at the front desk for pick up he can come when he is able.

## 2016-05-12 ENCOUNTER — Other Ambulatory Visit: Payer: Self-pay | Admitting: Endocrinology

## 2016-05-26 ENCOUNTER — Other Ambulatory Visit: Payer: Self-pay

## 2016-05-26 MED ORDER — SILODOSIN 8 MG PO CAPS: ORAL_CAPSULE | ORAL | 3 refills | Status: DC

## 2016-09-05 ENCOUNTER — Other Ambulatory Visit: Payer: Self-pay | Admitting: Endocrinology

## 2016-10-11 ENCOUNTER — Ambulatory Visit (INDEPENDENT_AMBULATORY_CARE_PROVIDER_SITE_OTHER): Payer: Medicare Other

## 2016-10-11 ENCOUNTER — Encounter: Payer: Self-pay | Admitting: Endocrinology

## 2016-10-11 DIAGNOSIS — Z23 Encounter for immunization: Secondary | ICD-10-CM

## 2017-04-15 ENCOUNTER — Other Ambulatory Visit: Payer: Self-pay | Admitting: Endocrinology

## 2017-04-15 ENCOUNTER — Other Ambulatory Visit (INDEPENDENT_AMBULATORY_CARE_PROVIDER_SITE_OTHER): Payer: Medicare Other

## 2017-04-15 DIAGNOSIS — N139 Obstructive and reflux uropathy, unspecified: Secondary | ICD-10-CM | POA: Diagnosis not present

## 2017-04-15 DIAGNOSIS — M14671 Charcot's joint, right ankle and foot: Secondary | ICD-10-CM

## 2017-04-15 DIAGNOSIS — R1310 Dysphagia, unspecified: Secondary | ICD-10-CM | POA: Diagnosis not present

## 2017-04-15 DIAGNOSIS — E7800 Pure hypercholesterolemia, unspecified: Secondary | ICD-10-CM

## 2017-04-15 LAB — LIPID PANEL
CHOL/HDL RATIO: 4
Cholesterol: 199 mg/dL (ref 0–200)
HDL: 55.2 mg/dL (ref >39.00)
LDL CALC: 127 mg/dL — AB (ref 0–99)
NONHDL: 143.65
Triglycerides: 85 mg/dL (ref 0.0–149.0)
VLDL: 17 mg/dL (ref 0.0–40.0)

## 2017-04-15 LAB — CBC WITH DIFFERENTIAL/PLATELET
BASOS ABS: 0 10*3/uL (ref 0.0–0.1)
BASOS PCT: 0.9 % (ref 0.0–3.0)
EOS ABS: 0.1 10*3/uL (ref 0.0–0.7)
Eosinophils Relative: 2.7 % (ref 0.0–5.0)
HCT: 40.2 % (ref 39.0–52.0)
Hemoglobin: 13.7 g/dL (ref 13.0–17.0)
Lymphocytes Relative: 30 % (ref 12.0–46.0)
Lymphs Abs: 1.5 10*3/uL (ref 0.7–4.0)
MCHC: 34.1 g/dL (ref 30.0–36.0)
MCV: 94.6 fl (ref 78.0–100.0)
MONO ABS: 0.7 10*3/uL (ref 0.1–1.0)
Monocytes Relative: 14.4 % — ABNORMAL HIGH (ref 3.0–12.0)
NEUTROS ABS: 2.6 10*3/uL (ref 1.4–7.7)
NEUTROS PCT: 52 % (ref 43.0–77.0)
PLATELETS: 198 10*3/uL (ref 150.0–400.0)
RBC: 4.24 Mil/uL (ref 4.22–5.81)
RDW: 13.7 % (ref 11.5–15.5)
WBC: 5 10*3/uL (ref 4.0–10.5)

## 2017-04-15 LAB — URINALYSIS, ROUTINE W REFLEX MICROSCOPIC
BILIRUBIN URINE: NEGATIVE
Hgb urine dipstick: NEGATIVE
KETONES UR: NEGATIVE
Leukocytes, UA: NEGATIVE
NITRITE: NEGATIVE
PH: 5.5 (ref 5.0–8.0)
SPECIFIC GRAVITY, URINE: 1.01 (ref 1.000–1.030)
Total Protein, Urine: NEGATIVE
UROBILINOGEN UA: 0.2 (ref 0.0–1.0)
Urine Glucose: NEGATIVE

## 2017-04-15 LAB — COMPREHENSIVE METABOLIC PANEL
ALT: 10 U/L (ref 0–53)
AST: 15 U/L (ref 0–37)
Albumin: 4.2 g/dL (ref 3.5–5.2)
Alkaline Phosphatase: 43 U/L (ref 39–117)
BUN: 14 mg/dL (ref 6–23)
CHLORIDE: 105 meq/L (ref 96–112)
CO2: 27 meq/L (ref 19–32)
CREATININE: 0.91 mg/dL (ref 0.40–1.50)
Calcium: 9.2 mg/dL (ref 8.4–10.5)
GFR: 85.14 mL/min (ref >60.00)
GLUCOSE: 93 mg/dL (ref 70–99)
Potassium: 4.5 mEq/L (ref 3.5–5.1)
SODIUM: 139 meq/L (ref 135–145)
Total Bilirubin: 0.8 mg/dL (ref 0.2–1.2)
Total Protein: 7 g/dL (ref 6.0–8.3)

## 2017-04-19 DIAGNOSIS — M1711 Unilateral primary osteoarthritis, right knee: Secondary | ICD-10-CM | POA: Insufficient documentation

## 2017-04-19 NOTE — Progress Notes (Signed)
Phillip Shelton is a 83 y.o. male.    Chief complaint:  Routine annual physical exam and follow-up of chronic problems   History of Present Illness:  Chronic problems addressed today are as follows:  1. JOINT PAIN:  He has had multiple joint pains including chronic foot deformities especially on the right side  However he has had more problems with his knee joints including the right one with decreased mobility and stiffness although not as much pain.  He had a steroid injection yesterday from orthopedic doctor  He also has shoulder, back pains He has had a Charcot foot.  Since he has had a tendon release surgery on the right leg he is not having much pain.    He continues to take meloxicam until recently He is now getting fair relief of his pain with Tylenol arthritis   2.  Difficulty swallowing and GERD:  A couple of months ago he had noticed that he was having difficulty swallowing in his food which tend to stick in the lower part of the chest He did not report this and on his own he stopped taking meloxicam With this his dysphagia improved He tried to take meloxicam again a couple of weeks ago but with taking it again he started having difficulty swallowing and he has stopped it with relief of the symptoms he was not having any pain or increased heartburn with this  He did have a finding of a benign stricture in 2012 on his endoscopy which was treated but upper endoscopy not repeated in 2017 when he had his colonoscopy.    No history of Barrett's esophagus  He will take Prilosec only occasionally as needed for heartburn with spicy foods.  He also keeps his head of the bed elevated and does not usually eat snacks late at night  3.  Hypercholesterolemia: He has had mildly increased levels of cholesterol that are usually at or below target  level of  130.  Generally cutting back on fat intake, sometimes will have sweets  He has been trying to increase his fruit and  vegetables also He  has never been on statin drugs   Lab Results  Component Value Date   CHOL 199 04/15/2017   HDL 55.20 04/15/2017   LDLCALC 127 (H) 04/15/2017   LDLDIRECT 133.0 12/22/2012   TRIG 85.0 04/15/2017   CHOLHDL 4 04/15/2017        4.  LOWER URINARY TRACT symptoms: He has had chronic problems with frequent urination at night,  getting up about 2-3 times.   Does not have any frequency of urination during the day  No complaints of hesitancy, has some slowing of stream chronically present.   Has been benefiting from Rapaflo compared to other drugs Now taking generic   5.  History of pre-diabetes since 2009. Blood sugar levels have been minimally increased and have been normal since 2012.   Glucose now is 93 fasting. No recent weight gain   PREVENTIVE CARE:  Fall risk screening done, has had no falls, does have some balance difficulties because of his foot problems Depression screening done, negative Diet: Discussed low saturated fat diet  Vitamin D supplementation: Patient using supplement in cod liver oil  Exercise: Patient's mobility limited by balance difficulties and joint pains as well as knee stiffness Patient is compliant with regular eye exams and dentist visits  Immunization status: Needs to have the new shingles vaccine  Cancer screening: colonoscopy in 6/17 Colorectal screening: Stool  Hemoccult pending   Lab Results  Component Value Date   OCCULTBLD Negative 04/11/2014   PSA not indicated at his age   Allergies as of 04/20/2017   No Known Allergies     Medication List        Accurate as of 04/20/17 10:25 AM. Always use your most recent med list.          fish oil-omega-3 fatty acids 1000 MG capsule Take 1 g by mouth at bedtime.   meloxicam 7.5 MG tablet Commonly known as:  MOBIC TAKE 1 TABLET (7.5 MG TOTAL) BY MOUTH DAILY.   omeprazole 20 MG capsule Commonly known as:  PRILOSEC TAKE 1 CAPSULE (20 MG TOTAL) BY MOUTH DAILY.     silodosin 8 MG Caps capsule Commonly known as:  RAPAFLO TAKE 1 CAPSULE (8 MG TOTAL) BY MOUTH AT BEDTIME.       Allergies: No Known Allergies  Past Medical History:  Diagnosis Date  . Arthritis   . Complication of anesthesia   . Diverticulosis   . Esophageal stricture   . GERD (gastroesophageal reflux disease)   . Hemorrhoids   . Hiatal hernia   . [place] tear 1981  . PONV (postoperative nausea and vomiting)     Past Surgical History:  Procedure Laterality Date  . CHOLECYSTECTOMY    . COLONOSCOPY    . GASTROCNEMIUS RECESSION  02/03/2011   Procedure: GASTROCNEMIUS SLIDE;  Surgeon: Colin Rhein, MD;  Location: [facility];  Service: Orthopedics;  Laterality: Right;  . HAND SURGERY  jan 2016   rt hand  . [place]  . TOTAL HIP ARTHROPLASTY Right 09/19/2014   Procedure: RIGHT TOTAL HIP ARTHROPLASTY ANTERIOR APPROACH;  Surgeon: Rod Can, MD;  Location: [facility];  Service: Orthopedics;  Laterality: Right;  . UMBILICAL HERNIA REPAIR    . UPPER GASTROINTESTINAL ENDOSCOPY      Family History  Problem Relation Age of Onset  . Diabetes Neg Hx   . Heart disease Neg Hx   . Stomach cancer Neg Hx   . Rectal cancer Neg Hx   . Arthritis Father   . Hypertension Father   . Colon cancer Maternal Uncle     Social History:  reports that he quit smoking about 37 years ago. He has never used smokeless tobacco. He reports that he drinks alcohol. He reports that he does not use drugs.  Review of Systems   No weight gain or loss   Wt Readings from Last 3 Encounters:  04/20/17 200 lb 12.8 oz (91.1 kg)  04/20/16 199 lb (90.3 kg)  10/21/15 202 lb 9.6 oz (91.9 kg)       Eyes: Normal vision, has had an examination every 2 years                 No headaches.     ENT:  has ear wax and had this cleaned as needed, hearing fairly good     Skin: No dryness or rash.        Thyroid:  No unusual fatigue.     No chest pain on activity.                  No palpitations.    No shortness of breath on exertion.      Blood pressure: Previously had mild increase in systolic reading but could not tolerate antihypertensives.  He checks his blood pressure periodically and this recently is 361W-431V  systolic range  No calf pain on walking.                      No swelling of feet.     Bowel habits: Normal        Rectal bleeding not present The previously had hemorrhoids       Has had some decrease in erections for several years. Has incomplete firmness and erections do not last long enough. Not desiring treatment.     In 2001 testosterone was normal.    JOINTS: See history of present illness      No numbness or tingling in feet or toes; no weakness in legs.      Has mild difficulty with balance       He has no insomnia    EXAM:  BP 130/70 (BP Location: Left Arm, Patient Position: Sitting, Cuff Size: Normal)   Pulse 83   Ht 6' (1.829 m)   Wt 200 lb 12.8 oz (91.1 kg)   SpO2 99%   BMI 27.23 kg/m   Physical Exam  Constitutional: He appears well-developed and well-nourished.  HENT:  Mouth/Throat: No oropharyngeal exudate.  Eyes: Conjunctivae are normal.  Fundii show normal discs and vessels  Neck: No thyromegaly present.  Cardiovascular: Regular rhythm, normal heart sounds and intact distal pulses. Exam reveals no gallop.  No murmur heard. Heart rate is regular, 70  Pulmonary/Chest: Breath sounds normal. He has no wheezes. He has no rales.  Mild hyperinflation of chest wall with liver dullness in the sixth space  Abdominal: Soft. He exhibits no distension, no pulsatile midline mass and no mass. There is no hepatosplenomegaly. There is no tenderness.  No abnormal epigastric  pulsation  Genitourinary: Rectum normal.  Genitourinary Comments: Prostate mildly enlarged, 1+,  smooth.  No nodules felt  Musculoskeletal: He exhibits no edema.       Lumbar back: He exhibits deformity.       Right foot: There is deformity.  He  has marked deformity of the bony structure of the feet especially the right with deformity of the tarsal bones.   Also has significant hallux valgus of the toes especially right.  Plantar surfaces of the feet are flat Mild scoliosis and loss of lumbar lordosis  Lymphadenopathy:    He has no cervical adenopathy.  Neurological: He is alert. He displays no tremor and normal reflexes. No sensory deficit.  Reflex Scores:      Bicep reflexes are 1+ on the right side and 0 on the left side.      Achilles reflexes are 0 on the right side and 0 on the left side. Skin: Skin is warm and dry. No rash noted.  Psychiatric: He has a normal mood and affect.  Vitals reviewed.  Assessment/Plan:   1. Mild impaired fasting glucose of 101 in 2009, glucose now quite normal Able to maintain his weight lower than before   2. Nocturia, relatively weak stream may be from mild BPH and also from overactive bladder.  He has benefited from using Rapaflo and can continue  3.  Hypercholesterolemia with previous LDL level 164.  His LDL is relatively better in the low 130 Will continue healthy diet  4.  REFLUX and DYSPHAGIA: He has had more symptoms related to his esophageal stricture but he says that it is resolved after stopping his MELOXICAM and he will continue to hold this Mild intermittent reflux symptoms controlled with Prilosec as needed He will need a repeat endoscopy if he  has recurrent symptoms of dysphagia  5.  Previous mild bradycardia appears to be improved  6.  Multiple joint pain and deformities from Charcot foot on the right, relieved by surgical treatment of tendon release.  Probable idiopathic large fiber neuropathy on the right lower leg causing Charcot foot  He can continue to take Tylenol which appears to be fairly effective  7.   History of anemia  iron deficiency after hip surgery: Hemoglobin consistently normal and he is not a candidate for blood donation now at his age   PREVENTIVE  care: He will get the new shingles vaccine at the drugstore Annual influenza vaccine Continue vitamin D supplementation with cardiomyopathy Will follow lipids annually Continue healthy low-fat diet Screening parameters completed  Elayne Snare 04/20/2017, 10:25 AM

## 2017-04-20 ENCOUNTER — Ambulatory Visit (INDEPENDENT_AMBULATORY_CARE_PROVIDER_SITE_OTHER): Payer: Medicare Other | Admitting: Endocrinology

## 2017-04-20 ENCOUNTER — Encounter: Payer: Self-pay | Admitting: Endocrinology

## 2017-04-20 VITALS — BP 130/70 | HR 83 | Ht 72.0 in | Wt 200.8 lb

## 2017-04-20 DIAGNOSIS — M150 Primary generalized (osteo)arthritis: Secondary | ICD-10-CM

## 2017-04-20 DIAGNOSIS — Z8719 Personal history of other diseases of the digestive system: Secondary | ICD-10-CM | POA: Diagnosis not present

## 2017-04-20 DIAGNOSIS — E7800 Pure hypercholesterolemia, unspecified: Secondary | ICD-10-CM

## 2017-04-20 DIAGNOSIS — Z0000 Encounter for general adult medical examination without abnormal findings: Secondary | ICD-10-CM

## 2017-04-20 DIAGNOSIS — M159 Polyosteoarthritis, unspecified: Secondary | ICD-10-CM

## 2017-05-09 IMAGING — RF DG HIP (WITH PELVIS) OPERATIVE*R*
1 series · 2 of 2 positions shown · non-contrast
Comparison: None.

CLINICAL DATA: Right total hip arthroplasty

EXAM:
DG C-ARM GT 120 MIN-NO REPORT; OPERATIVE RIGHT HIP WITH PELVIS

[Series 1: run · 2 of 2 slices shown]
[im 1/2]
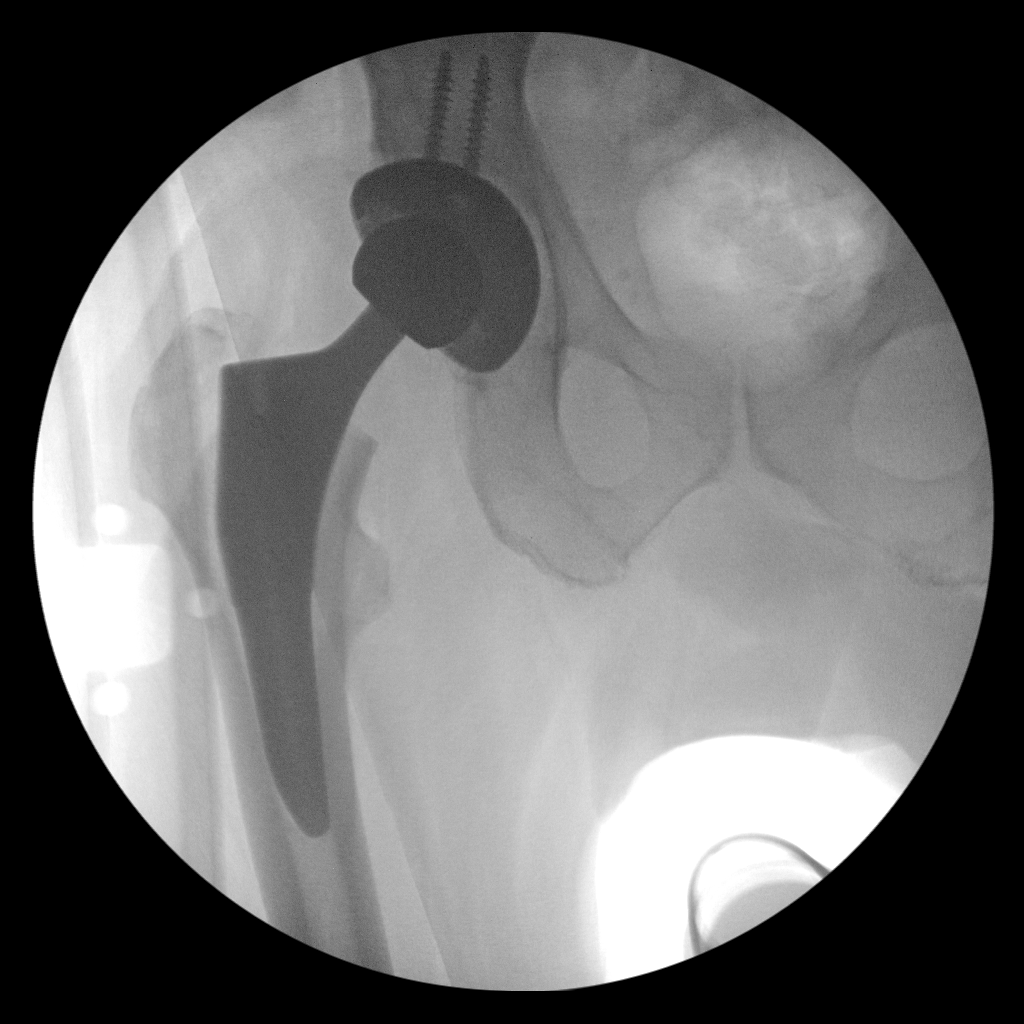
[im 2/2]
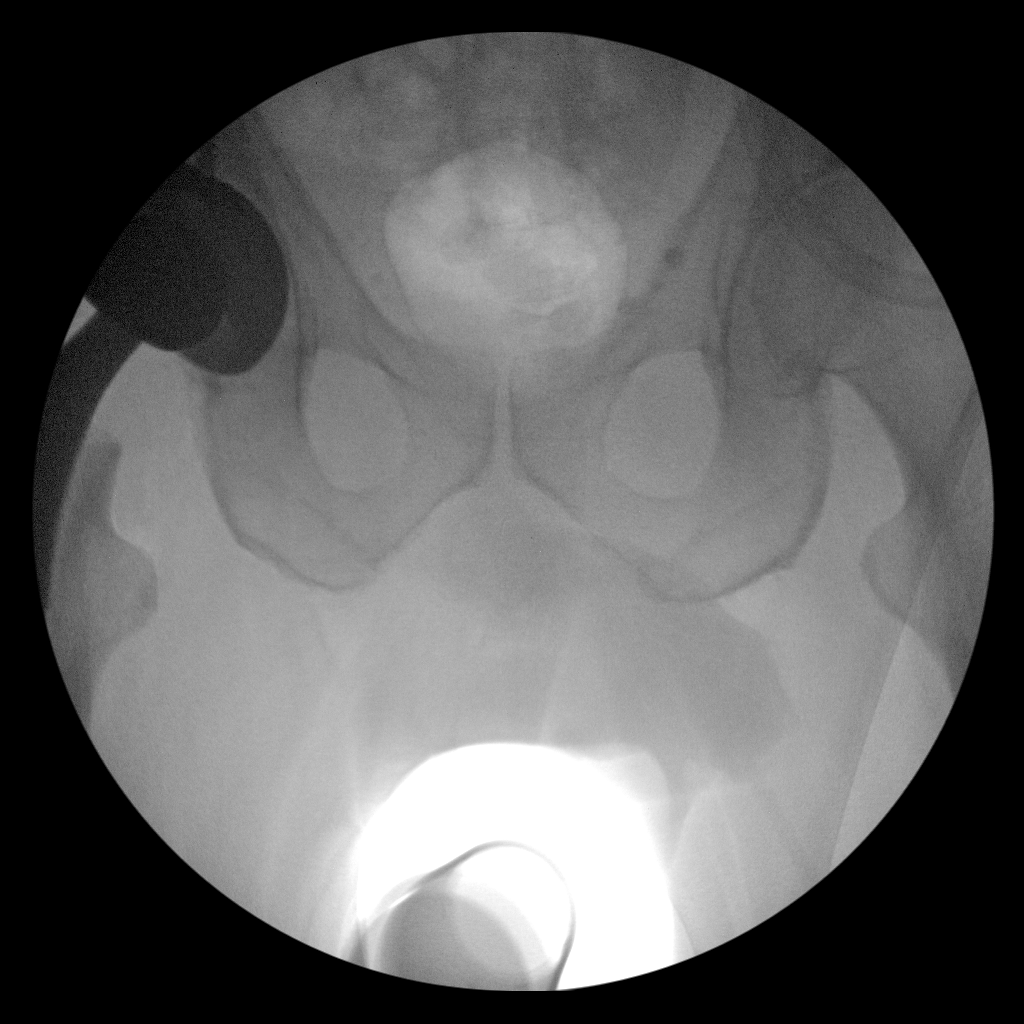

[2 of 2 positions shown; findings below may reference images not displayed]

FLUOROSCOPY TIME:  Radiation Exposure Index (as provided by the
fluoroscopic device):

If the device does not provide the exposure index:

Fluoroscopy Time:  1 min 18 seconds

Number of Acquired Images:  2
FINDINGS: The patient is status post right total hip arthroplasty. The
hardware components are in anatomic alignment. No dislocation or
periprosthetic fracture.
IMPRESSION: Status post right total hip arthroplasty.

## 2017-05-28 ENCOUNTER — Other Ambulatory Visit: Payer: Self-pay | Admitting: Endocrinology

## 2017-07-18 ENCOUNTER — Other Ambulatory Visit: Payer: Self-pay | Admitting: Endocrinology

## 2017-08-17 ENCOUNTER — Telehealth: Payer: Self-pay | Admitting: Endocrinology

## 2017-08-17 NOTE — Telephone Encounter (Signed)
Please schedule at the first available appointment  

## 2017-08-17 NOTE — Telephone Encounter (Signed)
First appointment is over a month out (09/21/17 at 945am) please advise where pt could be worked in sooner or ok to wait?

## 2017-08-17 NOTE — Telephone Encounter (Signed)
Patient stated he is needing an appointment asap for his blood pressure.   Blood pressure is 155/73 today  Last week patient stated that the insurance nurse that comes out checked and it was  150/82  Please advise

## 2017-08-17 NOTE — Telephone Encounter (Signed)
Please advise on below  

## 2017-08-17 NOTE — Telephone Encounter (Signed)
Please open up a appointment slot

## 2017-08-22 ENCOUNTER — Ambulatory Visit (INDEPENDENT_AMBULATORY_CARE_PROVIDER_SITE_OTHER): Payer: Medicare Other | Admitting: Endocrinology

## 2017-08-22 ENCOUNTER — Encounter: Payer: Self-pay | Admitting: Endocrinology

## 2017-08-22 VITALS — BP 138/64 | HR 73 | Ht 73.0 in | Wt 203.0 lb

## 2017-08-22 DIAGNOSIS — R030 Elevated blood-pressure reading, without diagnosis of hypertension: Secondary | ICD-10-CM | POA: Diagnosis not present

## 2017-08-22 DIAGNOSIS — M159 Polyosteoarthritis, unspecified: Secondary | ICD-10-CM

## 2017-08-22 DIAGNOSIS — M150 Primary generalized (osteo)arthritis: Secondary | ICD-10-CM | POA: Diagnosis not present

## 2017-08-22 NOTE — Progress Notes (Signed)
Patient ID: HAWTHORNE DAY, male   DOB: 09-Jul-1934, 83 y.o.   MRN: [id number]          Chief complaint: High blood pressure  History of Present Illness:   About 3 weeks ago he had a visit from his insurance nurse at home and was told his blood pressure was high at 601 systolic He checks his blood pressure periodically at home also on the left side He says that his blood pressure has been mostly about 140 or more and sometimes 150 and is concerned about this being high  In the past he has been treated with antihypertensive drugs only once but because of blood pressure dropping excessively this was stopped and subsequently blood pressure has been normal for several years  PROBLEM 2: Joint pains  On his last visit in April he was told to cut back on his meloxicam and not use this chronically and he was able to stop this However a few weeks ago because of pains in his legs/muscles he went back on meloxicam on his own and continues to take this He was also told to stop this because of symptoms of difficulty swallowing and heartburn which would occur with taking this regularly  Also in the last 2 to 3 weeks he has started drinking tart cherry juice and he thinks that he has had is taking care of his joint pains as well as helps him sleep better However he has not stopped taking his meloxicam   Allergies as of 08/22/2017   No Known Allergies     Medication List        Accurate as of 08/22/17 10:09 AM. Always use your most recent med list.          fish oil-omega-3 fatty acids 1000 MG capsule Take 1 g by mouth at bedtime.   meloxicam 7.5 MG tablet Commonly known as:  MOBIC TAKE 1 TABLET (7.5 MG TOTAL) BY MOUTH DAILY.   omeprazole 20 MG capsule Commonly known as:  PRILOSEC TAKE 1 CAPSULE (20 MG TOTAL) BY MOUTH DAILY.   silodosin 8 MG Caps capsule Commonly known as:  RAPAFLO TAKE 1 CAPSULE BY MOUTH AT BEDTIME.       Allergies: No Known Allergies  Past Medical History:    Diagnosis Date  . Arthritis   . Complication of anesthesia   . Diverticulosis   . Esophageal stricture   . GERD (gastroesophageal reflux disease)   . Hemorrhoids   . Hiatal hernia   . [place] tear 1981  . PONV (postoperative nausea and vomiting)     Past Surgical History:  Procedure Laterality Date  . CHOLECYSTECTOMY    . COLONOSCOPY    . GASTROCNEMIUS RECESSION  02/03/2011   Procedure: GASTROCNEMIUS SLIDE;  Surgeon: Colin Rhein, MD;  Location: [facility];  Service: Orthopedics;  Laterality: Right;  . HAND SURGERY  jan 2016   rt hand  . [place]  . TOTAL HIP ARTHROPLASTY Right 09/19/2014   Procedure: RIGHT TOTAL HIP ARTHROPLASTY ANTERIOR APPROACH;  Surgeon: Rod Can, MD;  Location: [facility];  Service: Orthopedics;  Laterality: Right;  . UMBILICAL HERNIA REPAIR    . UPPER GASTROINTESTINAL ENDOSCOPY      Family History  Problem Relation Age of Onset  . Diabetes Neg Hx   . Heart disease Neg Hx   . Stomach cancer Neg Hx   . Rectal cancer Neg Hx   . Arthritis Father   . Hypertension  Father   . Colon cancer Maternal Uncle     Social History:  reports that he quit smoking about 37 years ago. He has never used smokeless tobacco. He reports that he drinks alcohol. He reports that he does not use drugs.   No visits with results within 1 Week(s) from this visit.  Latest known visit with results is:  Lab on 04/15/2017  Component Date Value Ref Range Status  . WBC 04/15/2017 5.0  4.0 - 10.5 K/uL Final  . RBC 04/15/2017 4.24  4.22 - 5.81 Mil/uL Final  . Hemoglobin 04/15/2017 13.7  13.0 - 17.0 g/dL Final  . HCT 04/15/2017 40.2  39.0 - 52.0 % Final  . MCV 04/15/2017 94.6  78.0 - 100.0 fl Final  . MCHC 04/15/2017 34.1  30.0 - 36.0 g/dL Final  . RDW 04/15/2017 13.7  11.5 - 15.5 % Final  . Platelets 04/15/2017 198.0  150.0 - 400.0 K/uL Final  . Neutrophils Relative % 04/15/2017 52.0  43.0 - 77.0 % Final  . Lymphocytes Relative  04/15/2017 30.0  12.0 - 46.0 % Final  . Monocytes Relative 04/15/2017 14.4* 3.0 - 12.0 % Final  . Eosinophils Relative 04/15/2017 2.7  0.0 - 5.0 % Final  . Basophils Relative 04/15/2017 0.9  0.0 - 3.0 % Final  . Neutro Abs 04/15/2017 2.6  1.4 - 7.7 K/uL Final  . Lymphs Abs 04/15/2017 1.5  0.7 - 4.0 K/uL Final  . Monocytes Absolute 04/15/2017 0.7  0.1 - 1.0 K/uL Final  . Eosinophils Absolute 04/15/2017 0.1  0.0 - 0.7 K/uL Final  . Basophils Absolute 04/15/2017 0.0  0.0 - 0.1 K/uL Final  . Sodium 04/15/2017 139  135 - 145 mEq/L Final  . Potassium 04/15/2017 4.5  3.5 - 5.1 mEq/L Final  . Chloride 04/15/2017 105  96 - 112 mEq/L Final  . CO2 04/15/2017 27  19 - 32 mEq/L Final  . Glucose, Bld 04/15/2017 93  70 - 99 mg/dL Final  . BUN 04/15/2017 14  6 - 23 mg/dL Final  . Creatinine, Ser 04/15/2017 0.91  0.40 - 1.50 mg/dL Final  . Total Bilirubin 04/15/2017 0.8  0.2 - 1.2 mg/dL Final  . Alkaline Phosphatase 04/15/2017 43  39 - 117 U/L Final  . AST 04/15/2017 15  0 - 37 U/L Final  . ALT 04/15/2017 10  0 - 53 U/L Final  . Total Protein 04/15/2017 7.0  6.0 - 8.3 g/dL Final  . Albumin 04/15/2017 4.2  3.5 - 5.2 g/dL Final  . Calcium 04/15/2017 9.2  8.4 - 10.5 mg/dL Final  . GFR 04/15/2017 85.14  >60.00 mL/min Final  . Cholesterol 04/15/2017 199  0 - 200 mg/dL Final   ATP III Classification       Desirable:  < 200 mg/dL               Borderline High:  200 - 239 mg/dL          High:  > = 240 mg/dL  . Triglycerides 04/15/2017 85.0  0.0 - 149.0 mg/dL Final   Normal:  <150 mg/dLBorderline High:  150 - 199 mg/dL  . HDL 04/15/2017 55.20  >39.00 mg/dL Final  . VLDL 04/15/2017 17.0  0.0 - 40.0 mg/dL Final  . LDL Cholesterol 04/15/2017 127* 0 - 99 mg/dL Final  . Total CHOL/HDL Ratio 04/15/2017 4   Final                  Men  Women1/2 Average Risk     3.4          3.3Average Risk          5.0          4.42X Average Risk          9.6          7.13X Average Risk          15.0          11.0                       . NonHDL 04/15/2017 143.65   Final   NOTE:  Non-HDL goal should be 30 mg/dL higher than patient's LDL goal (i.e. LDL goal of < 70 mg/dL, would have non-HDL goal of < 100 mg/dL)  . Color, Urine 04/15/2017 YELLOW  Yellow;Lt. Yellow Final  . APPearance 04/15/2017 CLEAR  Clear Final  . Specific Gravity, Urine 04/15/2017 1.010  1.000 - 1.030 Final  . pH 04/15/2017 5.5  5.0 - 8.0 Final  . Total Protein, Urine 04/15/2017 NEGATIVE  Negative Final  . Urine Glucose 04/15/2017 NEGATIVE  Negative Final  . Ketones, ur 04/15/2017 NEGATIVE  Negative Final  . Bilirubin Urine 04/15/2017 NEGATIVE  Negative Final  . Hgb urine dipstick 04/15/2017 NEGATIVE  Negative Final  . Urobilinogen, UA 04/15/2017 0.2  0.0 - 1.0 Final  . Leukocytes, UA 04/15/2017 NEGATIVE  Negative Final  . Nitrite 04/15/2017 NEGATIVE  Negative Final  . WBC, UA 04/15/2017 0-2/hpf  0-2/hpf Final  . RBC / HPF 04/15/2017 0-2/hpf  0-2/hpf Final    EXAM:  BP 138/64 (BP Location: Left Arm, Patient Position: Sitting, Cuff Size: Normal)   Pulse 73   Ht 6\' 1"  (1.854 m)   Wt 203 lb (92.1 kg)   SpO2 98%   BMI 26.78 kg/m   Physical Exam  Blood pressure on the right was 130/75 with small cuff  Left side blood pressure was 140/76 both with small and large cuff  No ankle edema present   Assessment/Plan:   Increased blood pressure: He has had mildly increased but variable blood pressure readings at home Since his blood pressure checked several times in the office today was quite normal but reading was high at home this morning he may not be getting an accurate reading at home  Since he is not getting consistently high readings and his systolic is usually around 140 or less in the office today, considering his age he does not need to be on treatment Also previously has had excessive reduction of blood pressure even with small dose of antihypertensives  Joint pains: Currently controlled  Gastroesophageal reflux: He has had a  chronic history and his symptoms are worse when he takes meloxicam including some dysphagia at times Advised him to stay off meloxicam and may take Prilosec as often as every other day  Follow-up in 3 months for repeat blood pressure  Okie Bogacz 08/22/2017, 10:09 AM

## 2017-09-19 ENCOUNTER — Other Ambulatory Visit: Payer: Self-pay | Admitting: Endocrinology

## 2017-09-27 ENCOUNTER — Encounter: Payer: Self-pay | Admitting: Endocrinology

## 2017-10-21 ENCOUNTER — Ambulatory Visit (INDEPENDENT_AMBULATORY_CARE_PROVIDER_SITE_OTHER): Payer: Medicare Other

## 2017-10-21 ENCOUNTER — Other Ambulatory Visit: Payer: Self-pay | Admitting: Endocrinology

## 2017-10-21 DIAGNOSIS — R002 Palpitations: Secondary | ICD-10-CM

## 2017-10-21 DIAGNOSIS — Z23 Encounter for immunization: Secondary | ICD-10-CM | POA: Diagnosis not present

## 2017-10-21 NOTE — Progress Notes (Signed)
Pt is here today for high dose flu shot.

## 2017-11-06 ENCOUNTER — Other Ambulatory Visit: Payer: Self-pay | Admitting: Endocrinology

## 2017-11-10 ENCOUNTER — Other Ambulatory Visit: Payer: Self-pay | Admitting: Endocrinology

## 2017-11-11 ENCOUNTER — Ambulatory Visit (INDEPENDENT_AMBULATORY_CARE_PROVIDER_SITE_OTHER): Payer: Medicare Other | Admitting: Endocrinology

## 2017-11-11 ENCOUNTER — Encounter: Payer: Self-pay | Admitting: Endocrinology

## 2017-11-11 VITALS — BP 138/78 | HR 72 | Resp 16 | Wt 207.0 lb

## 2017-11-11 DIAGNOSIS — R030 Elevated blood-pressure reading, without diagnosis of hypertension: Secondary | ICD-10-CM | POA: Diagnosis not present

## 2017-11-11 NOTE — Progress Notes (Signed)
Patient ID: Phillip Shelton, male   DOB: 11-03-1934, 83 y.o.   MRN: [id number]          Chief complaint: Follow-up for blood pressure  History of Present Illness:   He was seen in 8/19 because of having a blood pressure of 182 systolic by an insurance nurse However blood pressure in the office was not high and he was told to come back for follow-up today Also he is monitoring blood pressure fairly regularly at home, he uses an automated machine on the left side  He says that his blood pressure has been mostly about 130-140  Several years ago he has been treated with antihypertensive drugs only once but because of blood pressure dropping excessively this was stopped  PROBLEM 2: Joint pains  Having only relatively mild pains in his joints With drinking tart cherry juice apparently his joint pains are improved Also has taken occasional Tylenol arthritis  He was told to meloxicam because of symptoms of difficulty swallowing and heartburn which would occur with taking this regularly However he still takes meloxicam periodically but he finds that if he is not taking it for 2 or 3 days he does not have any significant joint pain   Allergies as of 11/11/2017   No Known Allergies     Medication List        Accurate as of 11/11/17  2:11 PM. Always use your most recent med list.          fish oil-omega-3 fatty acids 1000 MG capsule Take 1 g by mouth at bedtime.   meloxicam 7.5 MG tablet Commonly known as:  MOBIC TAKE 1 TABLET (7.5 MG TOTAL) BY MOUTH DAILY.   omeprazole 20 MG capsule Commonly known as:  PRILOSEC TAKE 1 CAPSULE (20 MG TOTAL) BY MOUTH DAILY.   silodosin 8 MG Caps capsule Commonly known as:  RAPAFLO TAKE 1 CAPSULE BY MOUTH AT BEDTIME.       Allergies: No Known Allergies  Past Medical History:  Diagnosis Date  . Arthritis   . Complication of anesthesia   . Diverticulosis   . Esophageal stricture   . GERD (gastroesophageal reflux disease)   . Hemorrhoids    . Hiatal hernia   . [place] tear 1981  . PONV (postoperative nausea and vomiting)     Past Surgical History:  Procedure Laterality Date  . CHOLECYSTECTOMY    . COLONOSCOPY    . GASTROCNEMIUS RECESSION  02/03/2011   Procedure: GASTROCNEMIUS SLIDE;  Surgeon: Colin Rhein, MD;  Location: [facility];  Service: Orthopedics;  Laterality: Right;  . HAND SURGERY  jan 2016   rt hand  . [place]  . TOTAL HIP ARTHROPLASTY Right 09/19/2014   Procedure: RIGHT TOTAL HIP ARTHROPLASTY ANTERIOR APPROACH;  Surgeon: Rod Can, MD;  Location: [facility];  Service: Orthopedics;  Laterality: Right;  . UMBILICAL HERNIA REPAIR    . UPPER GASTROINTESTINAL ENDOSCOPY      Family History  Problem Relation Age of Onset  . Diabetes Neg Hx   . Heart disease Neg Hx   . Stomach cancer Neg Hx   . Rectal cancer Neg Hx   . Arthritis Father   . Hypertension Father   . Colon cancer Maternal Uncle     Social History:  reports that he quit smoking about 37 years ago. He has never used smokeless tobacco. He reports that he drinks alcohol. He reports that he does not use drugs.  Review of Systems  Cardiovascular: Negative for palpitations.  Gastrointestinal: Positive for heartburn.       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

## 2018-04-17 ENCOUNTER — Other Ambulatory Visit: Payer: Self-pay | Admitting: Endocrinology

## 2018-04-21 ENCOUNTER — Other Ambulatory Visit: Payer: Self-pay

## 2018-04-21 ENCOUNTER — Other Ambulatory Visit: Payer: Self-pay | Admitting: Endocrinology

## 2018-04-21 ENCOUNTER — Other Ambulatory Visit: Payer: Medicare Other

## 2018-04-25 ENCOUNTER — Encounter: Payer: Medicare Other | Admitting: Endocrinology

## 2018-06-01 ENCOUNTER — Other Ambulatory Visit: Payer: Self-pay | Admitting: Endocrinology

## 2018-06-01 DIAGNOSIS — E7800 Pure hypercholesterolemia, unspecified: Secondary | ICD-10-CM

## 2018-06-01 DIAGNOSIS — M150 Primary generalized (osteo)arthritis: Secondary | ICD-10-CM

## 2018-06-01 DIAGNOSIS — M159 Polyosteoarthritis, unspecified: Secondary | ICD-10-CM

## 2018-06-01 DIAGNOSIS — E559 Vitamin D deficiency, unspecified: Secondary | ICD-10-CM

## 2018-06-01 DIAGNOSIS — N139 Obstructive and reflux uropathy, unspecified: Secondary | ICD-10-CM

## 2018-06-02 ENCOUNTER — Other Ambulatory Visit: Payer: Medicare Other

## 2018-06-06 ENCOUNTER — Encounter: Payer: Medicare Other | Admitting: Endocrinology

## 2018-06-15 ENCOUNTER — Other Ambulatory Visit: Payer: Self-pay | Admitting: Endocrinology

## 2018-07-14 ENCOUNTER — Other Ambulatory Visit: Payer: Self-pay

## 2018-07-14 ENCOUNTER — Other Ambulatory Visit (INDEPENDENT_AMBULATORY_CARE_PROVIDER_SITE_OTHER): Payer: Medicare Other

## 2018-07-14 DIAGNOSIS — E559 Vitamin D deficiency, unspecified: Secondary | ICD-10-CM

## 2018-07-14 DIAGNOSIS — M150 Primary generalized (osteo)arthritis: Secondary | ICD-10-CM

## 2018-07-14 DIAGNOSIS — N139 Obstructive and reflux uropathy, unspecified: Secondary | ICD-10-CM

## 2018-07-14 DIAGNOSIS — M159 Polyosteoarthritis, unspecified: Secondary | ICD-10-CM

## 2018-07-14 DIAGNOSIS — E7800 Pure hypercholesterolemia, unspecified: Secondary | ICD-10-CM

## 2018-07-14 LAB — CBC WITH DIFFERENTIAL/PLATELET
Basophils Absolute: 0 10*3/uL (ref 0.0–0.1)
Basophils Relative: 0.8 % (ref 0.0–3.0)
Eosinophils Absolute: 0.1 10*3/uL (ref 0.0–0.7)
Eosinophils Relative: 2.4 % (ref 0.0–5.0)
HCT: 42.6 % (ref 39.0–52.0)
Hemoglobin: 14.2 g/dL (ref 13.0–17.0)
Lymphocytes Relative: 31.4 % (ref 12.0–46.0)
Lymphs Abs: 1.7 10*3/uL (ref 0.7–4.0)
MCHC: 33.3 g/dL (ref 30.0–36.0)
MCV: 95.7 fl (ref 78.0–100.0)
Monocytes Absolute: 0.8 10*3/uL (ref 0.1–1.0)
Monocytes Relative: 14.3 % — ABNORMAL HIGH (ref 3.0–12.0)
Neutro Abs: 2.7 10*3/uL (ref 1.4–7.7)
Neutrophils Relative %: 51.1 % (ref 43.0–77.0)
Platelets: 180 10*3/uL (ref 150.0–400.0)
RBC: 4.45 Mil/uL (ref 4.22–5.81)
RDW: 14 % (ref 11.5–15.5)
WBC: 5.3 10*3/uL (ref 4.0–10.5)

## 2018-07-14 LAB — URINALYSIS, ROUTINE W REFLEX MICROSCOPIC
Bilirubin Urine: NEGATIVE
Hgb urine dipstick: NEGATIVE
Ketones, ur: NEGATIVE
Leukocytes,Ua: NEGATIVE
Nitrite: NEGATIVE
Specific Gravity, Urine: 1.01 (ref 1.000–1.030)
Total Protein, Urine: NEGATIVE
Urine Glucose: NEGATIVE
Urobilinogen, UA: 0.2 (ref 0.0–1.0)
WBC, UA: NONE SEEN — AB (ref 0–?)
pH: 6.5 (ref 5.0–8.0)

## 2018-07-14 LAB — LIPID PANEL
Cholesterol: 178 mg/dL (ref 0–200)
HDL: 56.8 mg/dL (ref >39.00)
LDL Cholesterol: 110 mg/dL — ABNORMAL HIGH (ref 0–99)
NonHDL: 120.71
Total CHOL/HDL Ratio: 3
Triglycerides: 56 mg/dL (ref 0.0–149.0)
VLDL: 11.2 mg/dL (ref 0.0–40.0)

## 2018-07-14 LAB — VITAMIN D 25 HYDROXY (VIT D DEFICIENCY, FRACTURES): VITD: 34.52 ng/mL (ref 30.00–100.00)

## 2018-07-14 LAB — COMPREHENSIVE METABOLIC PANEL
ALT: 10 U/L (ref 0–53)
AST: 15 U/L (ref 0–37)
Albumin: 4.3 g/dL (ref 3.5–5.2)
Alkaline Phosphatase: 45 U/L (ref 39–117)
BUN: 16 mg/dL (ref 6–23)
CO2: 26 mEq/L (ref 19–32)
Calcium: 9.1 mg/dL (ref 8.4–10.5)
Chloride: 104 mEq/L (ref 96–112)
Creatinine, Ser: 0.98 mg/dL (ref 0.40–1.50)
GFR: 73.31 mL/min (ref >60.00)
Glucose, Bld: 94 mg/dL (ref 70–99)
Potassium: 4.7 mEq/L (ref 3.5–5.1)
Sodium: 136 mEq/L (ref 135–145)
Total Bilirubin: 0.9 mg/dL (ref 0.2–1.2)
Total Protein: 6.8 g/dL (ref 6.0–8.3)

## 2018-07-16 NOTE — Progress Notes (Signed)
Phillip Shelton is a 84 y.o. male.    Chief complaint:  Routine annual physical exam and various new and chronic problems  History of Present Illness:   1. JOINT PAIN:  He has had chronic multiple joint pains Although previously had been having significant problems in his feet requiring regular doses of anti-inflammatory drugs he is doing much better now after surgical treatment and custom molded shoes.  He has had a Charcot foot.   However he has had recurrent problems with his knee joints including the right knee addressed by the orthopedic surgeon in the past He has had more pain in the last 2 or 3 days although better today with more rest and continued use of Tylenol and occasional Bobi Previously has had steroid injections also  Low back pain is less prominent  2.GERD:  Occasionally will have reflux symptoms and will take Prilosec with relief No difficulty swallowing recently He thinks he feels better with minimizing the use of meloxicam   He did have a finding of a benign stricture in 2012 on his endoscopy which was treated but upper endoscopy not repeated in 2017 when he had his colonoscopy.    No history of Barrett's esophagus  He will take Prilosec only occasionally as needed for heartburn with spicy foods.  He also keeps his head of the bed elevated and does not usually eat snacks before bedtime  3.  Hypercholesterolemia: He has had mildly increased levels of cholesterol that are usually at or below target  level of  130.  He has been trying to cut back on high fat foods and sweets  He has been trying to increase his fruit and vegetables intake He  has never been on statin drugs  LDL relatively better now  Lab Results  Component Value Date   CHOL 178 07/14/2018   HDL 56.80 07/14/2018   LDLCALC 110 (H) 07/14/2018   LDLDIRECT 133.0 12/22/2012   TRIG 56.0 07/14/2018   CHOLHDL 3 07/14/2018        4.  LOWER URINARY TRACT symptoms: He has had  chronic problems with frequent urination at night,  getting up about 3 times.    Does not have any frequency of urination during the day  No complaints of hesitancy, has some slowing of stream  Has been benefiting from Rapaflo compared to other medications tried before    5.  Irregular heart rate: He says that when he checks his blood pressure his pulse appears to be slow and he is concerned about this.  May have had sinus bradycardia in the past also and no known cardiac arrhythmias Last EKG in October 2019 was normal No lightheadedness, shortness of breath or weakness  6.  History of pre-diabetes since 2009. Blood sugar levels have been minimally increased and have been normal since 2012.   Glucose below 100 consistently   PREVENTIVE CARE:  PREVENTIVE CARE:  History of falls: None   Annual hemoccults:           2016  testicular self exam                             recommended  Colonoscopy/sigmoidoscopy  2017  DRE  2019  Yearly flu vaccine:                      Done  Bone Density:   Not indicated  Calcium supplements:  Not  indicated Tetanus booster:   2016  Zostavax:  2012 Pneumovax/Prevnar:  20 18/2015   Lipid screening, last levels:   Fall risk screening done, has had no falls, does have mild impaired balance because of his foot problems Depression screening done, negative Diet: Discussed low saturated fat diet  Vitamin D supplementation: Patient getting vitamin D supplement in cod liver oil  Exercise: Patient's mobility limited by balance difficulties and joint pains as well as knee joint pain Patient is compliant with regular eye exams and dentist visits  Immunization status: Complete except needs to have the new shingles vaccine  Cancer screening:   Colorectal screening: Overdue Stool Hemoccult given to patient today   Lab Results  Component Value Date   OCCULTBLD Negative 04/11/2014   PSA not indicated at his age   Allergies as of 07/17/2018   No Known  Allergies     Medication List       Accurate as of July 17, 2018  1:24 PM. If you have any questions, ask your nurse or doctor.        fish oil-omega-3 fatty acids 1000 MG capsule Take 1 g by mouth at bedtime.   meloxicam 7.5 MG tablet Commonly known as: MOBIC TAKE 1 TABLET (7.5 MG TOTAL) BY MOUTH DAILY.   omeprazole 20 MG capsule Commonly known as: PRILOSEC TAKE 1 CAPSULE (20 MG TOTAL) BY MOUTH DAILY.   silodosin 8 MG Caps capsule Commonly known as: RAPAFLO TAKE 1 CAPSULE BY MOUTH AT BEDTIME.       Allergies: No Known Allergies  Past Medical History:  Diagnosis Date  . Arthritis   . Complication of anesthesia   . Diverticulosis   . Esophageal stricture   . GERD (gastroesophageal reflux disease)   . Hemorrhoids   . Hiatal hernia   . [place] tear 1981  . PONV (postoperative nausea and vomiting)     Past Surgical History:  Procedure Laterality Date  . CHOLECYSTECTOMY    . COLONOSCOPY    . GASTROCNEMIUS RECESSION  02/03/2011   Procedure: GASTROCNEMIUS SLIDE;  Surgeon: Colin Rhein, MD;  Location: [facility];  Service: Orthopedics;  Laterality: Right;  . HAND SURGERY  jan 2016   rt hand  . [place]  . TOTAL HIP ARTHROPLASTY Right 09/19/2014   Procedure: RIGHT TOTAL HIP ARTHROPLASTY ANTERIOR APPROACH;  Surgeon: Rod Can, MD;  Location: [facility];  Service: Orthopedics;  Laterality: Right;  . UMBILICAL HERNIA REPAIR    . UPPER GASTROINTESTINAL ENDOSCOPY      Family History  Problem Relation Age of Onset  . Diabetes Neg Hx   . Heart disease Neg Hx   . Stomach cancer Neg Hx   . Rectal cancer Neg Hx   . Arthritis Father   . Hypertension Father   . Colon cancer Maternal Uncle     Social History:  reports that he quit smoking about 38 years ago. He has never used smokeless tobacco. He reports current alcohol use. He reports that he does not use drugs.  Review of Systems    WEIGHT: He has lost weight. He thinks  he is trying to eat healthy diet and has no change in appetite, nausea or abdominal pain  However his weight is down compared to last year  Wt Readings from Last 3 Encounters:  07/17/18 199 lb (90.3 kg)  11/11/17 207 lb (93.9 kg)  08/22/17 203 lb (92.1 kg)       Eyes: Normal vision,  has had an examination every 2 years                 No headaches.     ENT: Hearing fairly good     Skin: No rash      Thyroid:  No recent fatigue.     No chest pain on exertion     No shortness of breath on exertion.      Blood pressure: Previously had mild increase in systolic reading but could not tolerate antihypertensives .  He checks his blood pressure periodically and this recently is in the 453M-468E  systolic range     No calf pain on walking.                      No swelling of feet.     Bowel habits: No recent change        Rectal bleeding not present He previously had hemorrhoids       Has had decrease in erections for several years. Has incomplete firmness and erections do not last long enough. Not desiring treatment.     In 2001 testosterone was normal.    JOINTS: See history of present illness      No numbness or tingling in feet or toes; no weakness in legs.      Has mild difficulty with balance      Has no insomnia    EXAM:  BP (!) 148/78 (BP Location: Right Arm, Patient Position: Sitting, Cuff Size: Normal)   Pulse (!) 35   Temp 98 F (36.7 C)   Ht 6' 1"  (1.854 m)   Wt 199 lb (90.3 kg)   SpO2 98%   BMI 26.25 kg/m   Physical Exam Vitals signs reviewed.  Constitutional:      Appearance: He is well-developed.  Eyes:     Conjunctiva/sclera: Conjunctivae normal.  Neck:     Thyroid: No thyromegaly.  Cardiovascular:     Rate and Rhythm: Rhythm irregular.     Heart sounds: Normal heart sounds. No murmur. No gallop.      Comments: Heart rate is irregular with premature beats periodically, heart rate about 60 Pulmonary:     Breath sounds: Normal breath  sounds. No wheezing or rales.  Abdominal:     General: There is no distension.     Palpations: Abdomen is soft. There is no mass or pulsatile mass.     Tenderness: There is no abdominal tenderness.     Comments: No abnormal epigastric  pulsation  Genitourinary:    Comments: Rectal exam deferred Musculoskeletal:     Lumbar back: He exhibits deformity.     Right foot: Deformity present.     Comments: He has deformity of the bony structure of the feet mostly on the right with deformity of the tarsal bones.   Also has significant hallux valgus of the toes especially right.  Plantar surfaces of the feet are flat Mild swelling of the right knee without warmth  Lymphadenopathy:     Cervical: No cervical adenopathy.  Skin:    General: Skin is warm and dry.     Findings: No rash.  Neurological:     Mental Status: He is alert.     Motor: No tremor.     Deep Tendon Reflexes:     Reflex Scores:      Bicep reflexes are 1+ on the right side and 0 on the left side.      Achilles  reflexes are 0 on the right side and 0 on the left side.   Assessment/Plan:   1. Mild impaired fasting glucose of 101 in 2009, glucose subsequently consistently normal, now 94 Has had a good diet  2. Nocturia, mildly weak stream likely to be from mild BPH.  Symptoms controlled with using Rapaflo and can continue the same dose  3.  Hypercholesterolemia with previous LDL level 164.  His LDL is relatively improved at 110 Will continue diet alone  4.  REFLUX and DYSPHAGIA: Has occasional symptoms No further dysphagia with reducing meloxicam  Mild intermittent reflux symptoms controlled with Prilosec as needed   5.  Appears to have mild bradycardia not likely to be from ectopics EKG today shows periodic PVCs but no couplets.  No other abnormality compared to previous  6.  Multiple joint pain and deformities from Charcot foot on the right, relieved by surgical treatment of tendon release.  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   Night sweats, weight loss: We will have him check his thyroid levels and ESR He will call if he has continued weight loss  PREVENTIVE care: He will get the new shingles vaccine at the drugstore when risk of pandemic is less Annual influenza vaccine Continue vitamin D supplementation Stool Hemoccult Handwashing, wearing masks in public for COVID prevention  He will try to be as active as possible Continue healthy low-fat diet Screening parameters completed  Elayne Snare 07/17/2018, 1:24 PM   Addendum: ESR 22 compatible with his osteoarthritis and thyroid function is normal  Elayne Snare

## 2018-07-17 ENCOUNTER — Encounter: Payer: Self-pay | Admitting: Endocrinology

## 2018-07-17 ENCOUNTER — Ambulatory Visit (INDEPENDENT_AMBULATORY_CARE_PROVIDER_SITE_OTHER): Payer: Medicare Other | Admitting: Endocrinology

## 2018-07-17 ENCOUNTER — Other Ambulatory Visit: Payer: Self-pay

## 2018-07-17 VITALS — BP 148/78 | HR 35 | Temp 98.0°F | Ht 73.0 in | Wt 199.0 lb

## 2018-07-17 DIAGNOSIS — R634 Abnormal weight loss: Secondary | ICD-10-CM | POA: Diagnosis not present

## 2018-07-17 DIAGNOSIS — N139 Obstructive and reflux uropathy, unspecified: Secondary | ICD-10-CM

## 2018-07-17 DIAGNOSIS — R61 Generalized hyperhidrosis: Secondary | ICD-10-CM

## 2018-07-17 DIAGNOSIS — M159 Polyosteoarthritis, unspecified: Secondary | ICD-10-CM

## 2018-07-17 DIAGNOSIS — E7800 Pure hypercholesterolemia, unspecified: Secondary | ICD-10-CM

## 2018-07-17 DIAGNOSIS — Z0000 Encounter for general adult medical examination without abnormal findings: Secondary | ICD-10-CM

## 2018-07-17 DIAGNOSIS — E559 Vitamin D deficiency, unspecified: Secondary | ICD-10-CM

## 2018-07-17 DIAGNOSIS — I499 Cardiac arrhythmia, unspecified: Secondary | ICD-10-CM

## 2018-07-17 DIAGNOSIS — M150 Primary generalized (osteo)arthritis: Secondary | ICD-10-CM

## 2018-07-17 LAB — TSH: TSH: 1.38 u[IU]/mL (ref 0.35–4.50)

## 2018-07-17 LAB — SEDIMENTATION RATE: Sed Rate: 22 mm/hr — ABNORMAL HIGH (ref 0–20)

## 2018-07-17 LAB — T4, FREE: Free T4: 0.8 ng/dL (ref 0.60–1.60)

## 2018-07-17 MED ORDER — OMEPRAZOLE 20 MG PO CPDR: 20.0000 mg | DELAYED_RELEASE_CAPSULE | Freq: Every day | ORAL | 2 refills | Status: DC

## 2018-07-17 NOTE — Patient Instructions (Addendum)
Please call if having any continued weight loss  Take supplemental vitamin D3 daily and 500 mg of calcium daily, may use multivitamin for this  Low saturated fat diet, low intake of sugar and salt  Regular dental and eye exams as recommended by the specialists  Make sure smoke detectors are functional at home Wear seat belts all the time  Please send stool sample with the Hemoccult kit provided

## 2018-07-24 DIAGNOSIS — M7122 Synovial cyst of popliteal space [Baker], left knee: Secondary | ICD-10-CM | POA: Insufficient documentation

## 2018-08-01 ENCOUNTER — Other Ambulatory Visit (INDEPENDENT_AMBULATORY_CARE_PROVIDER_SITE_OTHER): Payer: Medicare Other

## 2018-08-01 ENCOUNTER — Other Ambulatory Visit: Payer: Medicare Other

## 2018-08-01 ENCOUNTER — Other Ambulatory Visit: Payer: Self-pay

## 2018-08-01 DIAGNOSIS — Z1211 Encounter for screening for malignant neoplasm of colon: Secondary | ICD-10-CM | POA: Diagnosis not present

## 2018-08-01 LAB — FECAL OCCULT BLOOD, IMMUNOCHEMICAL: Fecal Occult Bld: NEGATIVE

## 2018-08-01 NOTE — Progress Notes (Signed)
Please call to let patient know that the lab results are normal and no further action needed

## 2018-08-02 ENCOUNTER — Telehealth: Payer: Self-pay | Admitting: Endocrinology

## 2018-08-02 NOTE — Telephone Encounter (Signed)
When someone calls with a message, can you please try and get a callback number? I have no way of getting in touch with this NP.

## 2018-08-02 NOTE — Telephone Encounter (Signed)
Jarales ,NP requested a referral be placed for this patient to go to cardiology. Heart rate is running in the 50s, 60s, 70s.  Please Advise, Thanks

## 2018-08-02 NOTE — Telephone Encounter (Signed)
He has had an EKG and he has ectopic beats, have not seen the need for cardiology referral

## 2018-08-03 ENCOUNTER — Other Ambulatory Visit: Payer: Self-pay | Admitting: Endocrinology

## 2018-08-03 DIAGNOSIS — I493 Ventricular premature depolarization: Secondary | ICD-10-CM

## 2018-08-03 NOTE — Telephone Encounter (Signed)
Called pt's wife to see if she might have some contact information for NP that came to see the pt. She gave me the only number she had which was for the [facility] nurse line at [phone]. This number was called and they stated that I needed to call customer nurse support at [phone]. After speaking with a nurse at this number, she placed me on a hold and stated that she spoke with clinical nurse support and representative named Delana Meyer stated that I do need to call the [phone] number and request to be transferred to the clinical nurse line because they will have access to the clinical documentation that supports my reason for calling. Jasmine stated that they do not open until 9am EST, and I will call back sometime after this.

## 2018-08-03 NOTE — Telephone Encounter (Signed)
Noted  

## 2018-08-03 NOTE — Telephone Encounter (Signed)
I can speak to the patient and his wife to explain, you do not have to call the nurse practitioner

## 2018-08-14 ENCOUNTER — Telehealth: Payer: Self-pay | Admitting: Endocrinology

## 2018-08-14 NOTE — Telephone Encounter (Signed)
Patient called re: patient was told by Dr. Dwyane Dee that Dr. Dwyane Dee would be sending patient to a Cardiologist. Patient has not heard anything re: any such appointment. Please call patient at ph# [phone] to advise.

## 2018-08-14 NOTE — Telephone Encounter (Signed)
Please review and advise.

## 2018-08-21 ENCOUNTER — Other Ambulatory Visit: Payer: Self-pay | Admitting: Endocrinology

## 2018-08-24 NOTE — Telephone Encounter (Signed)
Pt has an appt for 08/29/2018 at 2 pm

## 2018-08-29 ENCOUNTER — Ambulatory Visit: Payer: Medicare Other | Admitting: Cardiovascular Disease

## 2018-10-04 ENCOUNTER — Ambulatory Visit (INDEPENDENT_AMBULATORY_CARE_PROVIDER_SITE_OTHER): Payer: Medicare Other

## 2018-10-04 ENCOUNTER — Other Ambulatory Visit: Payer: Self-pay

## 2018-10-04 DIAGNOSIS — Z23 Encounter for immunization: Secondary | ICD-10-CM | POA: Diagnosis not present

## 2018-10-04 NOTE — Progress Notes (Signed)
Per orders of Dr. Kumar injection of HD Flu vaccine given IM in L deltoid today by A. Christin Moline, LPN . Denies any adverse reactions or discomfort.  

## 2018-10-09 ENCOUNTER — Other Ambulatory Visit: Payer: Self-pay | Admitting: Endocrinology

## 2018-10-13 ENCOUNTER — Ambulatory Visit: Payer: Medicare Other | Admitting: Interventional Cardiology

## 2018-10-13 ENCOUNTER — Encounter: Payer: Self-pay | Admitting: Interventional Cardiology

## 2018-10-13 ENCOUNTER — Other Ambulatory Visit: Payer: Self-pay

## 2018-10-13 ENCOUNTER — Encounter (INDEPENDENT_AMBULATORY_CARE_PROVIDER_SITE_OTHER): Payer: Self-pay

## 2018-10-13 VITALS — BP 160/70 | HR 73 | Ht 73.0 in | Wt 198.0 lb

## 2018-10-13 DIAGNOSIS — I493 Ventricular premature depolarization: Secondary | ICD-10-CM | POA: Diagnosis not present

## 2018-10-13 NOTE — Patient Instructions (Signed)
Medication Instructions:  Your physician recommends that you continue on your current medications as directed. Please refer to the Current Medication list given to you today.  If you need a refill on your cardiac medications before your next appointment, please call your pharmacy.   Lab work: None Ordered  If you have labs (blood work) drawn today and your tests are completely normal, you will receive your results only by: [name] MyChart Message (if you have MyChart) OR . A paper copy in the mail If you have any lab test that is abnormal or we need to change your treatment, we will call you to review the results.  Testing/Procedures: Your physician has recommended that you wear a 3 DAY monitor. These monitors are medical devices that record the heart's electrical activity. Doctors most often use these monitors to diagnose arrhythmias. Arrhythmias are problems with the speed or rhythm of the heartbeat. The monitor is a small, portable device. You can wear one while you do your normal daily activities. This is usually used to diagnose what is causing palpitations/syncope (passing out).  Follow-Up: . Based on test results  Any Other Special Instructions Will Be Listed Below (If Applicable).

## 2018-10-13 NOTE — Progress Notes (Signed)
Cardiology Office Note   Date:  10/13/2018   ID:  Phillip Shelton, DOB March 16, 1934, MRN [id number]  PCP:  Elayne Snare, MD    No chief complaint on file.  PVCs  Wt Readings from Last 3 Encounters:  10/13/18 198 lb (89.8 kg)  07/17/18 199 lb (90.3 kg)  11/11/17 207 lb (93.9 kg)       History of Present Illness: Phillip Shelton is a 84 y.o. male who is being seen today for the evaluation of PVCs at the request of Elayne Snare, MD.  His wife is my patient, Phillip Shelton.  She will be having brain surgery coming up.   The patient was noted to have PVCs on a routine ECG.  Then a nurse who visited him at home was concerned, so he is here today.  He only notices it when checking his BP.  No palpitations.    He is healthy.  He walks as much as he can- limited by knee pain.  He eats healthy.   Denies : Chest pain. Dizziness. Leg edema. Nitroglycerin use. Orthopnea. Palpitations. Paroxysmal nocturnal dyspnea. Shortness of breath. Syncope.     Past Medical History:  Diagnosis Date  . Arthritis   . Blood in stool 02/12/2010   Qualifier: Diagnosis of  By: Julaine Hua CMA ([place]), Estill Bamberg    . Complication of anesthesia   . Diverticulosis   . Esophageal stricture   . GERD 02/12/2010   Qualifier: Diagnosis of  By: Julaine Hua CMA ([place]), Estill Bamberg    . GERD (gastroesophageal reflux disease)   . Hemorrhoids   . Hiatal hernia   . Idiopathic Charcot's joint of foot 12/27/2012  . [place] tear 1981  . OAB (overactive bladder) 12/27/2012  . Osteoarthritis of right hip 09/19/2014  . PONV (postoperative nausea and vomiting)   . Pure hypercholesterolemia 12/27/2012    Past Surgical History:  Procedure Laterality Date  . CHOLECYSTECTOMY    . COLONOSCOPY    . GASTROCNEMIUS RECESSION  02/03/2011   Procedure: GASTROCNEMIUS SLIDE;  Surgeon: Colin Rhein, MD;  Location: [facility];  Service: Orthopedics;  Laterality: Right;  . HAND SURGERY  jan 2016   rt hand  . [place]  . TOTAL HIP ARTHROPLASTY Right 09/19/2014   Procedure: RIGHT TOTAL HIP ARTHROPLASTY ANTERIOR APPROACH;  Surgeon: Rod Can, MD;  Location: [facility];  Service: Orthopedics;  Laterality: Right;  . UMBILICAL HERNIA REPAIR    . UPPER GASTROINTESTINAL ENDOSCOPY       Current Outpatient Medications  Medication Sig Dispense Refill  . fish oil-omega-3 fatty acids 1000 MG capsule Take 1 g by mouth at bedtime.     . meloxicam (MOBIC) 7.5 MG tablet TAKE 1 TABLET (7.5 MG TOTAL) BY MOUTH DAILY. 90 tablet 1  . omeprazole (PRILOSEC) 20 MG capsule TAKE 1 CAPSULE BY MOUTH EVERY DAY 90 capsule 0  . silodosin (RAPAFLO) 8 MG CAPS capsule TAKE 1 CAPSULE BY MOUTH AT BEDTIME. 90 capsule 2   No current facility-administered medications for this visit.     Allergies:   Patient has no known allergies.    Social History:  The patient  reports that he quit smoking about 38 years ago. He has never used smokeless tobacco. He reports current alcohol use. He reports that he does not use drugs.   Family History:  The patient's family history includes Arthritis in his father; Colon cancer in his maternal uncle; Hypertension in his father.  ROS:  Please see the history of present illness.   Otherwise, review of systems are positive for knee pain.   All other systems are reviewed and negative.    PHYSICAL EXAM: VS:  BP (!) 160/70   Pulse 73   Ht 6\' 1"  (1.854 m)   Wt 198 lb (89.8 kg)   SpO2 98%   BMI 26.12 kg/m  , BMI Body mass index is 26.12 kg/m. GEN: Well nourished, well developed, in no acute distress  HEENT: normal  Neck: no JVD, carotid bruits, or masses Cardiac: RRR, premature beats; no murmurs, rubs, or gallops,no edema  Respiratory:  clear to auscultation bilaterally, normal work of breathing GI: soft, nontender, nondistended, + BS MS: no deformity or atrophy  Skin: warm and dry, no rash Neuro:  Strength and sensation are intact; slow gait Psych: euthymic mood, full affect    EKG:   The ekg ordered 07/2018 demonstrates NSR, PVCs   Recent Labs: 07/14/2018: ALT 10; BUN 16; Creatinine, Ser 0.98; Hemoglobin 14.2; Platelets 180.0; Potassium 4.7; Sodium 136 07/17/2018: TSH 1.38   Lipid Panel    Component Value Date/Time   CHOL 178 07/14/2018 1021   TRIG 56.0 07/14/2018 1021   HDL 56.80 07/14/2018 1021   CHOLHDL 3 07/14/2018 1021   VLDL 11.2 07/14/2018 1021   LDLCALC 110 (H) 07/14/2018 1021   LDLDIRECT 133.0 12/22/2012 0815     Other studies Reviewed: Additional studies/ records that were reviewed today with results demonstrating: ECG and labs reviewed.   ASSESSMENT AND PLAN:  1. PVCs: He is asymptomatic.  Will plan for monitor to get a sense of the percentage of heartbeats that are PVCs.  If >25%, may need EP evaluation. No signs of CHF on exam.  2. Wife is having brain surgery at the end of October.     Current medicines are reviewed at length with the patient today.  The patient concerns regarding his medicines were addressed.  The following changes have been made:  No change  Labs/ tests ordered today include:  No orders of the defined types were placed in this encounter.   Recommend 150 minutes/week of aerobic exercise Low fat, low carb, high fiber diet recommended  Disposition:   FU based on monitor result   Signed, Larae Grooms, MD  10/13/2018 [facility] [address], [place], [place]  [address] Phone: [phone]; Fax: [phone]

## 2018-10-18 ENCOUNTER — Telehealth: Payer: Self-pay

## 2018-10-18 NOTE — Telephone Encounter (Signed)
Spoke with pt's Wife, went over monitor instructions. Verified address. Ordered 3 day ZIO to be mailed to pt's home address.

## 2018-10-27 ENCOUNTER — Ambulatory Visit (INDEPENDENT_AMBULATORY_CARE_PROVIDER_SITE_OTHER): Payer: Medicare Other

## 2018-10-27 DIAGNOSIS — I493 Ventricular premature depolarization: Secondary | ICD-10-CM | POA: Diagnosis not present

## 2018-11-16 ENCOUNTER — Other Ambulatory Visit: Payer: Self-pay

## 2018-11-16 DIAGNOSIS — I493 Ventricular premature depolarization: Secondary | ICD-10-CM

## 2018-11-24 ENCOUNTER — Other Ambulatory Visit: Payer: Self-pay

## 2018-11-24 ENCOUNTER — Ambulatory Visit (HOSPITAL_COMMUNITY): Payer: Medicare Other | Attending: Cardiology

## 2018-11-24 DIAGNOSIS — E785 Hyperlipidemia, unspecified: Secondary | ICD-10-CM | POA: Insufficient documentation

## 2018-11-24 DIAGNOSIS — I083 Combined rheumatic disorders of mitral, aortic and tricuspid valves: Secondary | ICD-10-CM | POA: Insufficient documentation

## 2018-11-24 DIAGNOSIS — I493 Ventricular premature depolarization: Secondary | ICD-10-CM | POA: Insufficient documentation

## 2018-11-24 DIAGNOSIS — Z87891 Personal history of nicotine dependence: Secondary | ICD-10-CM | POA: Diagnosis not present

## 2019-01-02 ENCOUNTER — Emergency Department (HOSPITAL_COMMUNITY)
Admission: EM | Admit: 2019-01-02 | Discharge: 2019-01-02 | Disposition: A | Discharge status: Home / Self Care | Payer: Medicare Other | Attending: Emergency Medicine | Admitting: Emergency Medicine

## 2019-01-02 ENCOUNTER — Other Ambulatory Visit: Payer: Self-pay

## 2019-01-02 ENCOUNTER — Encounter (HOSPITAL_COMMUNITY): Payer: Self-pay

## 2019-01-02 ENCOUNTER — Emergency Department (HOSPITAL_COMMUNITY): Payer: Medicare Other

## 2019-01-02 DIAGNOSIS — Z87891 Personal history of nicotine dependence: Secondary | ICD-10-CM | POA: Diagnosis not present

## 2019-01-02 DIAGNOSIS — W19XXXA Unspecified fall, initial encounter: Secondary | ICD-10-CM

## 2019-01-02 DIAGNOSIS — Z79899 Other long term (current) drug therapy: Secondary | ICD-10-CM | POA: Insufficient documentation

## 2019-01-02 DIAGNOSIS — S3992XA Unspecified injury of lower back, initial encounter: Secondary | ICD-10-CM | POA: Insufficient documentation

## 2019-01-02 DIAGNOSIS — Y9389 Activity, other specified: Secondary | ICD-10-CM | POA: Insufficient documentation

## 2019-01-02 DIAGNOSIS — W07XXXA Fall from chair, initial encounter: Secondary | ICD-10-CM | POA: Insufficient documentation

## 2019-01-02 DIAGNOSIS — Y999 Unspecified external cause status: Secondary | ICD-10-CM | POA: Insufficient documentation

## 2019-01-02 DIAGNOSIS — Z96641 Presence of right artificial hip joint: Secondary | ICD-10-CM | POA: Diagnosis not present

## 2019-01-02 DIAGNOSIS — Y929 Unspecified place or not applicable: Secondary | ICD-10-CM | POA: Diagnosis not present

## 2019-01-02 NOTE — Discharge Instructions (Addendum)
Your x-rays today did not show any fractures, dislocations.  You may apply ice or heat to the area.  You may also continue taking Tylenol to help with your symptoms.  If you experience any weakness, worsening pain, problems with your bowels or urine you may return to the emergency department.

## 2019-01-02 NOTE — ED Notes (Signed)
ED Provider at bedside. 

## 2019-01-02 NOTE — ED Provider Notes (Signed)
[facility] DEPT Provider Note   CSN: [id number] Arrival date & time: 01/02/19  1044     History Chief Complaint  Patient presents with  . Fall  . Back Pain    Phillip Shelton is a 84 y.o. male.  84 y.o male with a PMH of arthritis, GERD, diverticulosis presents to the ED status post mechanical fall x 2 days ago.  Patient reports he was sitting on a folding chair when he suddenly went to stand up in the chair collapsed on the ground. He reports most of his buttock absorbed the fall. He reports the pain feels like an ache to the buttock area, this is worse with changing in position but relieved with lying still. He has tried tylenol for his symptoms but reports this helps with the pain primarily.  He denies hitting his head, loss of consciousness, headache.  No weakness, urinary symptoms, not on any blood thinners.    The history is provided by the patient and medical records.  Fall Pertinent negatives include no chest pain, no abdominal pain and no shortness of breath.  Back Pain Associated symptoms: no abdominal pain, no chest pain, no dysuria and no fever        Past Medical History:  Diagnosis Date  . Arthritis   . Blood in stool 02/12/2010   Qualifier: Diagnosis of  By: Julaine Hua CMA ([place]), Estill Bamberg    . Complication of anesthesia   . Diverticulosis   . Esophageal stricture   . GERD 02/12/2010   Qualifier: Diagnosis of  By: Julaine Hua CMA ([place]), Estill Bamberg    . GERD (gastroesophageal reflux disease)   . Hemorrhoids   . Hiatal hernia   . Idiopathic Charcot's joint of foot 12/27/2012  . [place] tear 1981  . OAB (overactive bladder) 12/27/2012  . Osteoarthritis of right hip 09/19/2014  . PONV (postoperative nausea and vomiting)   . Pure hypercholesterolemia 12/27/2012    Patient Active Problem List   Diagnosis Date Noted  . Osteoarthritis of right hip 09/19/2014  . Idiopathic Charcot's joint of foot 12/27/2012  . Pure  hypercholesterolemia 12/27/2012  . OAB (overactive bladder) 12/27/2012  . Hemorrhoids 08/26/2010  . GERD 02/12/2010    Past Surgical History:  Procedure Laterality Date  . CHOLECYSTECTOMY    . COLONOSCOPY    . GASTROCNEMIUS RECESSION  02/03/2011   Procedure: GASTROCNEMIUS SLIDE;  Surgeon: Colin Rhein, MD;  Location: [facility];  Service: Orthopedics;  Laterality: Right;  . HAND SURGERY  jan 2016   rt hand  . [place]  . TOTAL HIP ARTHROPLASTY Right 09/19/2014   Procedure: RIGHT TOTAL HIP ARTHROPLASTY ANTERIOR APPROACH;  Surgeon: Rod Can, MD;  Location: [facility];  Service: Orthopedics;  Laterality: Right;  . UMBILICAL HERNIA REPAIR    . UPPER GASTROINTESTINAL ENDOSCOPY         Family History  Problem Relation Age of Onset  . Arthritis Father   . Hypertension Father   . Colon cancer Maternal Uncle   . Diabetes Neg Hx   . Heart disease Neg Hx   . Stomach cancer Neg Hx   . Rectal cancer Neg Hx     Social History   Tobacco Use  . Smoking status: Former Smoker    Quit date: 01/29/1980    Years since quitting: 38.9  . Smokeless tobacco: Never Used  Substance Use Topics  . Alcohol use: Yes    Alcohol/week: 0.0 standard drinks  Comment: 2 beers daily  . Drug use: No    Home Medications Prior to Admission medications   Medication Sig Start Date End Date Taking? Authorizing Provider  fish oil-omega-3 fatty acids 1000 MG capsule Take 1 g by mouth at bedtime.     [provider]  meloxicam (MOBIC) 7.5 MG tablet TAKE 1 TABLET (7.5 MG TOTAL) BY MOUTH DAILY. 08/21/18   Elayne Snare, MD  omeprazole (PRILOSEC) 20 MG capsule TAKE 1 CAPSULE BY MOUTH EVERY DAY 10/09/18   Elayne Snare, MD  silodosin (RAPAFLO) 8 MG CAPS capsule TAKE 1 CAPSULE BY MOUTH AT BEDTIME. 04/17/18   Elayne Snare, MD    Allergies    Patient has no known allergies.  Review of Systems   Review of Systems  Constitutional: Negative for chills and fever.  HENT:  Negative for ear pain and sore throat.   Eyes: Negative for pain and visual disturbance.  Respiratory: Negative for cough and shortness of breath.   Cardiovascular: Negative for chest pain and palpitations.  Gastrointestinal: Negative for abdominal pain, nausea and vomiting.  Genitourinary: Negative for dysuria and hematuria.  Musculoskeletal: Positive for back pain and myalgias. Negative for arthralgias.  Skin: Negative for color change and rash.  Neurological: Negative for seizures and syncope.  All other systems reviewed and are negative.   Physical Exam Updated Vital Signs BP (!) 152/87 (BP Location: Left Arm)   Pulse 78   Temp 97.9 F (36.6 C) (Oral)   Resp 14   Ht 6\' 1"  (1.854 m)   Wt 90.7 kg   SpO2 99%   BMI 26.39 kg/m   Physical Exam Vitals and nursing note reviewed.  Constitutional:      Appearance: Normal appearance.  HENT:     Head: Normocephalic and atraumatic.     Nose: Nose normal.     Mouth/Throat:     Mouth: Mucous membranes are moist.  Eyes:     Pupils: Pupils are equal, round, and reactive to light.  Cardiovascular:     Rate and Rhythm: Normal rate.  Pulmonary:     Effort: Pulmonary effort is normal.     Breath sounds: Normal breath sounds. No wheezing or rales.  Abdominal:     General: Abdomen is flat.     Tenderness: There is no abdominal tenderness.  Musculoskeletal:     Cervical back: Normal range of motion and neck supple.       Legs:     Comments: No midline tenderness, weakness is intact. RLE- KF,KE 5/5 strength LLE- HF, HE 5/5 strength Normal gait. No pronator drift. No leg drop.  Patellar reflexes present and symmetric. CN I, II and VIII not tested. CN II-XII grossly intact bilaterally.      Skin:    General: Skin is warm and dry.  Neurological:     Mental Status: He is alert and oriented to person, place, and time.     ED Results / Procedures / Treatments   Labs (all labs ordered are listed, but only abnormal results are  displayed) Labs Reviewed - No data to display  EKG None  Radiology DG Lumbar Spine Complete  Result Date: 01/02/2019 CLINICAL DATA:  Fall from chair EXAM: LUMBAR SPINE - COMPLETE 4+ VIEW COMPARISON:  None. FINDINGS: Levoscoliosis of the upper lumbar spine. No significant listhesis. Mild loss of height at the right aspect of L1 vertebral body is likely related to scoliotic curvature. Advanced multilevel degenerative disc disease with disc height loss and endplate osteophytes. There  is significant multilevel facet hypertrophy. Partially imaged right hip arthroplasty. IMPRESSION: No definite acute fracture. Advanced multilevel degenerative changes. Electronically Signed   By: Macy Mis M.D.   On: 01/02/2019 11:31   DG Sacrum/Coccyx  Result Date: 01/02/2019 CLINICAL DATA:  Fall.  Pain EXAM: SACRUM AND COCCYX - 2+ VIEW COMPARISON:  None. FINDINGS: There is no evidence of fracture or other focal bone lesions. Degenerative disc disease identified within the visualized lumbar spine. Previous right hip arthroplasty. Moderate to marked left hip osteoarthritis. IMPRESSION: 1. No acute findings. 2. Lumbar spine and left hip osteoarthritis Electronically Signed   By: Kerby Moors M.D.   On: 01/02/2019 11:27    Procedures Procedures (including critical care time)  Medications Ordered in ED Medications - No data to display  ED Course  I have reviewed the triage vital signs and the nursing notes.  Pertinent labs & imaging results that were available during my care of the patient were reviewed by me and considered in my medical decision making (see chart for details).    MDM Rules/Calculators/A&P  Patient with extensive past medical history presents to the ED status post mechanical fall x2 days.  Patient reports sitting in a folding chair when he suddenly fell on his buttocks, there is pain to his tailbone which she reports is an ache, worsening with movement.  No weakness, not any blood  thinners, headache, loss of consciousness.  Patient arrived in the ED with stable vital signs, exam is unremarkable, without any deficit.  Of note he does walk with a cane at baseline, does have a prior history of right hip replacement.  Xray of his lumbar spine along with sacrum did not show any acute deformities such as fractures, dislocations.  These results have been discussed with patient at length, he is advised to continue therapy with RICE along with Tylenol.  Patient understands and agrees with management, return precautions discussed at length.  Portions of this note were generated with Lobbyist. Dictation errors may occur despite best attempts at proofreading.  Final Clinical Impression(s) / ED Diagnoses Final diagnoses:  Fall, initial encounter  Injury of coccyx, initial encounter    Rx / DC Orders ED Discharge Orders    None       Janeece Fitting, PA-C 01/02/19 1245    Wyvonnia Dusky, MD 01/02/19 2115

## 2019-01-02 NOTE — ED Triage Notes (Signed)
Pt states he was getting out of a chair on Sunday when it fell backwards. Pt states he fell "on his butt". Pt c/o back pain as well. Pt states some relief with tylenol, but is concerned for injury.

## 2019-01-02 NOTE — ED Notes (Addendum)
Patient ambulated to X-ray 

## 2019-01-10 ENCOUNTER — Other Ambulatory Visit: Payer: Self-pay | Admitting: Endocrinology

## 2019-01-15 ENCOUNTER — Other Ambulatory Visit (INDEPENDENT_AMBULATORY_CARE_PROVIDER_SITE_OTHER): Payer: Medicare Other

## 2019-01-15 ENCOUNTER — Other Ambulatory Visit: Payer: Self-pay

## 2019-01-15 DIAGNOSIS — E7800 Pure hypercholesterolemia, unspecified: Secondary | ICD-10-CM

## 2019-01-15 DIAGNOSIS — R634 Abnormal weight loss: Secondary | ICD-10-CM

## 2019-01-15 LAB — COMPREHENSIVE METABOLIC PANEL
ALT: 12 U/L (ref 0–53)
AST: 17 U/L (ref 0–37)
Albumin: 4.2 g/dL (ref 3.5–5.2)
Alkaline Phosphatase: 68 U/L (ref 39–117)
BUN: 15 mg/dL (ref 6–23)
CO2: 26 mEq/L (ref 19–32)
Calcium: 9.3 mg/dL (ref 8.4–10.5)
Chloride: 105 mEq/L (ref 96–112)
Creatinine, Ser: 0.86 mg/dL (ref 0.40–1.50)
GFR: 85.13 mL/min (ref >60.00)
Glucose, Bld: 95 mg/dL (ref 70–99)
Potassium: 4.1 mEq/L (ref 3.5–5.1)
Sodium: 138 mEq/L (ref 135–145)
Total Bilirubin: 0.9 mg/dL (ref 0.2–1.2)
Total Protein: 7 g/dL (ref 6.0–8.3)

## 2019-01-15 LAB — CBC
HCT: 41.2 % (ref 39.0–52.0)
Hemoglobin: 13.6 g/dL (ref 13.0–17.0)
MCHC: 33.2 g/dL (ref 30.0–36.0)
MCV: 95.6 fl (ref 78.0–100.0)
Platelets: 237 10*3/uL (ref 150.0–400.0)
RBC: 4.3 Mil/uL (ref 4.22–5.81)
RDW: 13.7 % (ref 11.5–15.5)
WBC: 6 10*3/uL (ref 4.0–10.5)

## 2019-01-18 ENCOUNTER — Other Ambulatory Visit: Payer: Self-pay

## 2019-01-18 ENCOUNTER — Ambulatory Visit: Payer: Medicare Other | Admitting: Endocrinology

## 2019-01-18 ENCOUNTER — Encounter: Payer: Self-pay | Admitting: Endocrinology

## 2019-01-18 VITALS — BP 128/78 | HR 66 | Ht 73.0 in | Wt 197.6 lb

## 2019-01-18 DIAGNOSIS — K219 Gastro-esophageal reflux disease without esophagitis: Secondary | ICD-10-CM

## 2019-01-18 DIAGNOSIS — N139 Obstructive and reflux uropathy, unspecified: Secondary | ICD-10-CM | POA: Diagnosis not present

## 2019-01-18 DIAGNOSIS — E7800 Pure hypercholesterolemia, unspecified: Secondary | ICD-10-CM

## 2019-01-18 DIAGNOSIS — M8949 Other hypertrophic osteoarthropathy, multiple sites: Secondary | ICD-10-CM | POA: Diagnosis not present

## 2019-01-18 DIAGNOSIS — M159 Polyosteoarthritis, unspecified: Secondary | ICD-10-CM

## 2019-01-18 NOTE — Patient Instructions (Signed)
   We are committed to keeping you informed about the COVID-19 vaccine.    For our most current information, please visit [email or web address].

## 2019-01-18 NOTE — Progress Notes (Signed)
Phillip Shelton is a 85 y.o. male.    Chief complaint: Follow-up of various problems  History of Present Illness:   1. JOINT PAIN:  He has had chronic diffuse joint pains  He has had chronic problems with his knee joints especially right.  Has seen the orthopedic surgeon in the past  Previously has had steroid injections also Now he takes periodically meloxicam at other times Tylenol  Although previously had been having significant problems in his feet requiring regular doses of anti-inflammatory drugs he is doing much better now after surgical treatment and custom molded shoes.  He has had a Charcot foot.   Occasionally has back pain also  2.GERD:  He has reflux symptoms on and off especially with spicy food and will take Prilosec with relief No difficulty swallowing currently Previously had been told to avoid taking meloxicam daily and this has not helped   He did have a finding of a benign stricture in 2012 on his endoscopy which was treated but upper endoscopy not repeated in 2017 when he had his colonoscopy.    No history of Barrett's esophagus   He also keeps his head of the bed elevated and does not usually eat snacks before bedtime  3.  Hypercholesterolemia:  He has had mildly increased levels of cholesterol that are usually at or below target  level of  130.  Usually avoiding high fat foods and sweets  He has been trying to increase his fruit and vegetables  He  has never been on statin drugs  LDL relatively better in 2020  Lab Results  Component Value Date   CHOL 178 07/14/2018   HDL 56.80 07/14/2018   LDLCALC 110 (H) 07/14/2018   LDLDIRECT 133.0 12/22/2012   TRIG 56.0 07/14/2018   CHOLHDL 3 07/14/2018        4.  LOWER URINARY TRACT symptoms: He has had chronic problems with frequent urination at night,  getting up about 3 times.  Discontinue is unchanged Does not have any frequency of urination during the day  No complaints of  hesitancy, has some slowing of stream  Has had more relief from Rapaflo compared to other medications tried before    5.  Irregular heart rate: He had noticed that when he checks his blood pressure his pulse appears to be slow and he is concerned about this.  Cardiac evaluation including Holter monitor was not indicating any significant process and no treatment recommended  6.  History of pre-diabetes since 2009. Blood sugar levels have been minimally increased and have been normal since 2012.   Glucose below 100 consistently    Allergies as of 01/18/2019   No Known Allergies     Medication List       Accurate as of January 18, 2019  1:04 PM. If you have any questions, ask your nurse or doctor.        fish oil-omega-3 fatty acids 1000 MG capsule Take 1 g by mouth at bedtime.   meloxicam 7.5 MG tablet Commonly known as: MOBIC TAKE 1 TABLET (7.5 MG TOTAL) BY MOUTH DAILY.   MULTIVITAMIN ADULTS PO Take 1 tablet by mouth daily.   omeprazole 20 MG capsule Commonly known as: PRILOSEC TAKE 1 CAPSULE BY MOUTH EVERY DAY   silodosin 8 MG Caps capsule Commonly known as: RAPAFLO TAKE 1 CAPSULE BY MOUTH EVERYDAY AT BEDTIME       Allergies: No Known Allergies  Past Medical History:  Diagnosis Date  .  Arthritis   . Blood in stool 02/12/2010   Qualifier: Diagnosis of  By: Julaine Hua CMA ([place]), Estill Bamberg    . Complication of anesthesia   . Diverticulosis   . Esophageal stricture   . GERD 02/12/2010   Qualifier: Diagnosis of  By: Julaine Hua CMA ([place]), Estill Bamberg    . GERD (gastroesophageal reflux disease)   . Hemorrhoids   . Hiatal hernia   . Idiopathic Charcot's joint of foot 12/27/2012  . [place] tear 1981  . OAB (overactive bladder) 12/27/2012  . Osteoarthritis of right hip 09/19/2014  . PONV (postoperative nausea and vomiting)   . Pure hypercholesterolemia 12/27/2012    Past Surgical History:  Procedure Laterality Date  . CHOLECYSTECTOMY    . COLONOSCOPY    .  GASTROCNEMIUS RECESSION  02/03/2011   Procedure: GASTROCNEMIUS SLIDE;  Surgeon: Colin Rhein, MD;  Location: [place];  Service: Orthopedics;  Laterality: Right;  . HAND SURGERY  jan 2016   rt hand  . [place]  . TOTAL HIP ARTHROPLASTY Right 09/19/2014   Procedure: RIGHT TOTAL HIP ARTHROPLASTY ANTERIOR APPROACH;  Surgeon: Rod Can, MD;  Location: [facility];  Service: Orthopedics;  Laterality: Right;  . UMBILICAL HERNIA REPAIR    . UPPER GASTROINTESTINAL ENDOSCOPY      Family History  Problem Relation Age of Onset  . Arthritis Father   . Hypertension Father   . Colon cancer Maternal Uncle   . Diabetes Neg Hx   . Heart disease Neg Hx   . Stomach cancer Neg Hx   . Rectal cancer Neg Hx     Social History:  reports that he quit smoking about 38 years ago. He has never used smokeless tobacco. He reports current alcohol use. He reports that he does not use drugs.  Review of Systems    WEIGHT history:  With his family is trying to eat healthy diet and has no change in appetite, nausea or abdominal pain  Previously was losing weight and this is stable now  Wt Readings from Last 3 Encounters:  01/18/19 197 lb 9.6 oz (89.6 kg)  01/02/19 200 lb (90.7 kg)  10/13/18 198 lb (89.8 kg)       EXAM:  BP 128/78 (BP Location: Left Arm, Patient Position: Sitting, Cuff Size: Normal)   Pulse 66   Ht 6\' 1"  (1.854 m)   Wt 197 lb 9.6 oz (89.6 kg)   SpO2 96%   BMI 26.07 kg/m   Physical Exam Assessment/Plan:   1.  History of high normal blood pressure: This appears improved  2. Nocturia, mildly weak stream likely to be from mild BPH.  Symptoms controlled with using Rapaflo and can continue the same dose  3.  Hypercholesterolemia with previous LDL level 164.  His LDL was relatively improved at 110 Will continue to monitor, repeat labs on the next visit   4.  REFLUX with history of dysphagia: Symptoms are controlled and he can continue to take  Prilosec as needed or with spicy foods or higher fat meals Call if having any difficulty swallowing   5.  History of mild bradycardia and PVCs: No significant underlying disease found by cardiologist and can be seen as needed for them  6.  Knee joint pain: Since she is not having any side effect from meloxicam he can continue to take this periodically as needed in between taking Tylenol    7.   Night sweats, weight loss: Appears to have resolved  Labs reviewed, all are normal  PREVENTIVE care: He will get the Covid vaccine and given him information on where to get this from an contact details New shingles vaccine can be given at the drugstore in the near future also   Elayne Snare 01/18/2019, 1:04 PM

## 2019-02-03 ENCOUNTER — Ambulatory Visit: Payer: Medicare Other

## 2019-02-09 ENCOUNTER — Ambulatory Visit: Payer: Medicare Other

## 2019-02-10 ENCOUNTER — Ambulatory Visit: Payer: Medicare Other | Attending: Internal Medicine

## 2019-02-10 DIAGNOSIS — Z23 Encounter for immunization: Secondary | ICD-10-CM | POA: Insufficient documentation

## 2019-02-10 NOTE — Progress Notes (Signed)
   Covid-19 Vaccination Clinic  Name:  Phillip Shelton    MRN: [id number] DOB: 08/25/1934  02/10/2019  [name] was observed post Covid-19 immunization for 15 minutes without incidence. He was provided with Vaccine Information Sheet and instruction to access the V-Safe system.   [name] was instructed to call 911 with any severe reactions post vaccine: [name] Difficulty breathing  . Swelling of your face and throat  . A fast heartbeat  . A bad rash all over your body  . Dizziness and weakness    Immunizations Administered    Name Date Dose VIS Date Route   Pfizer COVID-19 Vaccine 02/10/2019  9:46 AM 0.3 mL 12/15/2018 Intramuscular   Manufacturer: [place]   Lot: [id number]   [place]: [id number]

## 2019-02-14 ENCOUNTER — Ambulatory Visit: Payer: Medicare Other

## 2019-03-02 ENCOUNTER — Telehealth: Payer: Self-pay | Admitting: Endocrinology

## 2019-03-02 NOTE — Telephone Encounter (Signed)
New message:   This is a pt of Dr. Dwyane Dee and pt states this Dr will no longer be doing primary care appts for his patients. Dr. Dwyane Dee suggested Dr. Jenny Reichmann for the patients and so they would like to know if he would be willing to take on her care. Please advise.

## 2019-03-03 NOTE — Telephone Encounter (Signed)
Ok with me 

## 2019-03-05 NOTE — Telephone Encounter (Signed)
Appt has been set-up for 07/18/19.Marland KitchenJohny Shelton

## 2019-03-05 NOTE — Telephone Encounter (Signed)
Forwarding msg to front office staff to make new office visit.Marland KitchenJohny Shelton

## 2019-03-07 ENCOUNTER — Ambulatory Visit: Payer: Medicare Other | Attending: Internal Medicine

## 2019-03-07 DIAGNOSIS — Z23 Encounter for immunization: Secondary | ICD-10-CM

## 2019-03-07 NOTE — Progress Notes (Signed)
   Covid-19 Vaccination Clinic  Name:  Phillip Shelton    MRN: [id number] DOB: 20-Nov-1934  03/07/2019  [name] was observed post Covid-19 immunization for 15 minutes without incident. He was provided with Vaccine Information Sheet and instruction to access the V-Safe system.   [name] was instructed to call 911 with any severe reactions post vaccine: [name] Difficulty breathing  . Swelling of face and throat  . A fast heartbeat  . A bad rash all over body  . Dizziness and weakness   Immunizations Administered    Name Date Dose VIS Date Route   Pfizer COVID-19 Vaccine 03/07/2019  9:38 AM 0.3 mL 12/15/2018 Intramuscular   Manufacturer: [place]   Lot: [id number]   [place]: [id number]

## 2019-06-05 ENCOUNTER — Other Ambulatory Visit: Payer: Self-pay | Admitting: Endocrinology

## 2019-07-17 ENCOUNTER — Other Ambulatory Visit: Payer: Medicare Other

## 2019-07-18 ENCOUNTER — Other Ambulatory Visit: Payer: Self-pay

## 2019-07-18 ENCOUNTER — Encounter: Payer: Self-pay | Admitting: Internal Medicine

## 2019-07-18 ENCOUNTER — Ambulatory Visit (INDEPENDENT_AMBULATORY_CARE_PROVIDER_SITE_OTHER): Payer: Medicare Other | Admitting: Internal Medicine

## 2019-07-18 VITALS — BP 146/90 | HR 65 | Temp 97.8°F | Ht 73.0 in | Wt 192.0 lb

## 2019-07-18 DIAGNOSIS — Z0001 Encounter for general adult medical examination with abnormal findings: Secondary | ICD-10-CM | POA: Insufficient documentation

## 2019-07-18 DIAGNOSIS — E559 Vitamin D deficiency, unspecified: Secondary | ICD-10-CM | POA: Diagnosis not present

## 2019-07-18 DIAGNOSIS — Z0000 Encounter for general adult medical examination without abnormal findings: Secondary | ICD-10-CM | POA: Diagnosis not present

## 2019-07-18 DIAGNOSIS — E538 Deficiency of other specified B group vitamins: Secondary | ICD-10-CM

## 2019-07-18 MED ORDER — SILODOSIN 8 MG PO CAPS: ORAL_CAPSULE | ORAL | 3 refills | Status: DC

## 2019-07-18 MED ORDER — ZOSTER VAC RECOMB ADJUVANTED 50 MCG/0.5ML IM SUSR: 0.5000 mL | Freq: Once | INTRAMUSCULAR | 1 refills | Status: AC

## 2019-07-18 MED ORDER — OMEPRAZOLE 20 MG PO CPDR: DELAYED_RELEASE_CAPSULE | ORAL | 3 refills | Status: DC

## 2019-07-18 MED ORDER — MELOXICAM 7.5 MG PO TABS: ORAL_TABLET | ORAL | 1 refills | Status: DC

## 2019-07-18 NOTE — Progress Notes (Signed)
Subjective:    Patient ID: Phillip Shelton, male    DOB: 25-May-1934, 85 y.o.   MRN: [id number]  HPI  Here for wellness and f/u;  Overall doing ok;  Pt denies Chest pain, worsening SOB, DOE, wheezing, orthopnea, PND, worsening LE edema, palpitations, dizziness or syncope.  Pt denies neurological change such as new headache, facial or extremity weakness.  Pt denies polydipsia, polyuria, or low sugar symptoms. Pt states overall good compliance with treatment and medications, good tolerability, and has been trying to follow appropriate diet.  Pt denies worsening depressive symptoms, suicidal ideation or panic. No fever, night sweats, wt loss, loss of appetite, or other constitutional symptoms.  Pt states good ability with ADL's, has low fall risk, home safety reviewed and adequate, no other significant changes in hearing or vision, and only occasionally active with exercise. No new complaints Past Medical History:  Diagnosis Date  . Arthritis   . Blood in stool 02/12/2010   Qualifier: Diagnosis of  By: Julaine Hua CMA ([place]), Estill Bamberg    . Complication of anesthesia   . Diverticulosis   . Esophageal stricture   . GERD 02/12/2010   Qualifier: Diagnosis of  By: Julaine Hua CMA ([place]), Estill Bamberg    . GERD (gastroesophageal reflux disease)   . Hemorrhoids   . Hiatal hernia   . Idiopathic Charcot's joint of foot 12/27/2012  . [place] tear 1981  . OAB (overactive bladder) 12/27/2012  . Osteoarthritis of right hip 09/19/2014  . PONV (postoperative nausea and vomiting)   . Pure hypercholesterolemia 12/27/2012   Past Surgical History:  Procedure Laterality Date  . CHOLECYSTECTOMY    . COLONOSCOPY    . GASTROCNEMIUS RECESSION  02/03/2011   Procedure: GASTROCNEMIUS SLIDE;  Surgeon: Colin Rhein, MD;  Location: [place];  Service: Orthopedics;  Laterality: Right;  . HAND SURGERY  jan 2016   rt hand  . [place]  . TOTAL HIP ARTHROPLASTY Right 09/19/2014   Procedure:  RIGHT TOTAL HIP ARTHROPLASTY ANTERIOR APPROACH;  Surgeon: Rod Can, MD;  Location: [facility];  Service: Orthopedics;  Laterality: Right;  . UMBILICAL HERNIA REPAIR    . UPPER GASTROINTESTINAL ENDOSCOPY      reports that he quit smoking about 39 years ago. He has never used smokeless tobacco. He reports current alcohol use. He reports that he does not use drugs. family history includes Arthritis in his father; Colon cancer in his maternal uncle; Hypertension in his father. No Known Allergies Current Outpatient Medications on File Prior to Visit  Medication Sig Dispense Refill  . Acetaminophen (TYLENOL 8 HOUR ARTHRITIS PAIN PO) Take by mouth.    . fish oil-omega-3 fatty acids 1000 MG capsule Take 1 g by mouth at bedtime.     . Multiple Vitamins-Minerals (MULTIVITAMIN ADULTS PO) Take 1 tablet by mouth daily.     No current facility-administered medications on file prior to visit.   Review of Systems All otherwise neg per pt     Objective:   Physical Exam BP (!) 146/90   Pulse 65   Temp 97.8 F (36.6 C) (Oral)   Ht 6\' 1"  (1.854 m)   Wt 192 lb (87.1 kg)   SpO2 98%   BMI 25.33 kg/m  VS noted,  Constitutional: Pt appears in NAD HENT: Head: NCAT.  Right Ear: External ear normal.  Left Ear: External ear normal.  Eyes: . Pupils are equal, round, and reactive to light. Conjunctivae and EOM are normal Nose: without  d/c or deformity Neck: Neck supple. Gross normal ROM Cardiovascular: Normal rate and regular rhythm.   Pulmonary/Chest: Effort normal and breath sounds without rales or wheezing.  Abd:  Soft, NT, ND, + BS, no organomegaly Neurological: Pt is alert. At baseline orientation, motor grossly intact Skin: Skin is warm. No rashes, other new lesions, no LE edema Psychiatric: Pt behavior is normal without agitation  All otherwise neg per pt Lab Results  Component Value Date   WBC 6.5 07/18/2019   HGB 14.3 07/18/2019   HCT 42.0 07/18/2019   PLT 237 07/18/2019   GLUCOSE 88  07/18/2019   CHOL 205 (H) 07/18/2019   TRIG 126 07/18/2019   HDL 60 07/18/2019   LDLDIRECT 133.0 12/22/2012   LDLCALC 121 (H) 07/18/2019   ALT 8 (L) 07/18/2019   AST 13 07/18/2019   NA 138 07/18/2019   K 4.8 07/18/2019   CL 103 07/18/2019   CREATININE 0.98 07/18/2019   BUN 14 07/18/2019   CO2 26 07/18/2019   TSH 1.42 07/18/2019   INR 1.04 09/12/2014   MICROALBUR 3.3 (H) 12/26/2013      Assessment & Plan:

## 2019-07-18 NOTE — Patient Instructions (Addendum)
Your Shingrix shingles shot prescription was sent to the pharmacy  Please continue all other medications as before, and refills have been done if requested.  Please have the pharmacy call with any other refills you may need.  Please continue your efforts at being more active, low cholesterol diet, and weight control.  You are otherwise up to date with prevention measures today.  Please keep your appointments with your specialists as you may have planned  Please go to the LAB at the blood drawing area for the tests to be done  You will be contacted by phone if any changes need to be made immediately.  Otherwise, you will receive a letter about your results with an explanation, but please check with MyChart first.  Please remember to sign up for MyChart if you have not done so, as this will be important to you in the future with finding out test results, communicating by private email, and scheduling acute appointments online when needed.  Please make an Appointment to return in 6 months, or sooner if needed

## 2019-07-19 ENCOUNTER — Other Ambulatory Visit: Payer: Self-pay | Admitting: Internal Medicine

## 2019-07-19 ENCOUNTER — Encounter: Payer: Self-pay | Admitting: Internal Medicine

## 2019-07-19 LAB — BASIC METABOLIC PANEL
BUN: 14 mg/dL (ref 7–25)
CO2: 26 mmol/L (ref 20–32)
Calcium: 9.7 mg/dL (ref 8.6–10.3)
Chloride: 103 mmol/L (ref 98–110)
Creat: 0.98 mg/dL (ref 0.70–1.11)
Glucose, Bld: 88 mg/dL (ref 65–99)
Potassium: 4.8 mmol/L (ref 3.5–5.3)
Sodium: 138 mmol/L (ref 135–146)

## 2019-07-19 LAB — URINALYSIS, ROUTINE W REFLEX MICROSCOPIC
Bilirubin Urine: NEGATIVE
Glucose, UA: NEGATIVE
Hgb urine dipstick: NEGATIVE
Ketones, ur: NEGATIVE
Leukocytes,Ua: NEGATIVE
Nitrite: NEGATIVE
Protein, ur: NEGATIVE
Specific Gravity, Urine: 1.009 (ref 1.001–1.03)
pH: 6 (ref 5.0–8.0)

## 2019-07-19 LAB — HEPATIC FUNCTION PANEL
AG Ratio: 1.8 (calc) (ref 1.0–2.5)
ALT: 8 U/L — ABNORMAL LOW (ref 9–46)
AST: 13 U/L (ref 10–35)
Albumin: 4.4 g/dL (ref 3.6–5.1)
Alkaline phosphatase (APISO): 50 U/L (ref 35–144)
Bilirubin, Direct: 0.2 mg/dL (ref 0.0–0.2)
Globulin: 2.4 g/dL (calc) (ref 1.9–3.7)
Indirect Bilirubin: 0.8 mg/dL (calc) (ref 0.2–1.2)
Total Bilirubin: 1 mg/dL (ref 0.2–1.2)
Total Protein: 6.8 g/dL (ref 6.1–8.1)

## 2019-07-19 LAB — CBC WITH DIFFERENTIAL/PLATELET
Absolute Monocytes: 982 cells/uL — ABNORMAL HIGH (ref 200–950)
Basophils Absolute: 52 cells/uL (ref 0–200)
Basophils Relative: 0.8 %
Eosinophils Absolute: 169 cells/uL (ref 15–500)
Eosinophils Relative: 2.6 %
HCT: 42 % (ref 38.5–50.0)
Hemoglobin: 14.3 g/dL (ref 13.2–17.1)
Lymphs Abs: 1950 cells/uL (ref 850–3900)
MCH: 32.1 pg (ref 27.0–33.0)
MCHC: 34 g/dL (ref 32.0–36.0)
MCV: 94.4 fL (ref 80.0–100.0)
MPV: 11.2 fL (ref 7.5–12.5)
Monocytes Relative: 15.1 %
Neutro Abs: 3348 cells/uL (ref 1500–7800)
Neutrophils Relative %: 51.5 %
Platelets: 237 10*3/uL (ref 140–400)
RBC: 4.45 10*6/uL (ref 4.20–5.80)
RDW: 12.7 % (ref 11.0–15.0)
Total Lymphocyte: 30 %
WBC: 6.5 10*3/uL (ref 3.8–10.8)

## 2019-07-19 LAB — LIPID PANEL
Cholesterol: 205 mg/dL — ABNORMAL HIGH (ref <200)
HDL: 60 mg/dL (ref >=40)
LDL Cholesterol (Calc): 121 mg/dL (calc) — ABNORMAL HIGH
Non-HDL Cholesterol (Calc): 145 mg/dL (calc) — ABNORMAL HIGH (ref <130)
Total CHOL/HDL Ratio: 3.4 (calc) (ref <5.0)
Triglycerides: 126 mg/dL (ref <150)

## 2019-07-19 LAB — TSH: TSH: 1.42 mIU/L (ref 0.40–4.50)

## 2019-07-19 LAB — VITAMIN D 25 HYDROXY (VIT D DEFICIENCY, FRACTURES): Vit D, 25-Hydroxy: 35 ng/mL (ref 30–100)

## 2019-07-19 LAB — VITAMIN B12: Vitamin B-12: 399 pg/mL (ref 200–1100)

## 2019-07-19 MED ORDER — ATORVASTATIN CALCIUM 20 MG PO TABS: 20.0000 mg | ORAL_TABLET | Freq: Every day | ORAL | 3 refills | Status: DC

## 2019-07-20 ENCOUNTER — Ambulatory Visit: Payer: Medicare Other | Admitting: Endocrinology

## 2019-07-21 ENCOUNTER — Encounter: Payer: Self-pay | Admitting: Internal Medicine

## 2019-07-21 NOTE — Assessment & Plan Note (Signed)

## 2019-07-25 ENCOUNTER — Telehealth: Payer: Self-pay

## 2019-07-25 NOTE — Telephone Encounter (Deleted)
Error

## 2019-10-02 ENCOUNTER — Other Ambulatory Visit: Payer: Self-pay

## 2019-10-02 ENCOUNTER — Ambulatory Visit (INDEPENDENT_AMBULATORY_CARE_PROVIDER_SITE_OTHER): Payer: Medicare Other

## 2019-10-02 DIAGNOSIS — Z23 Encounter for immunization: Secondary | ICD-10-CM | POA: Diagnosis not present

## 2019-10-20 ENCOUNTER — Ambulatory Visit: Payer: Medicare Other | Attending: Internal Medicine

## 2019-10-20 DIAGNOSIS — Z23 Encounter for immunization: Secondary | ICD-10-CM

## 2019-10-20 NOTE — Progress Notes (Signed)
   Covid-19 Vaccination Clinic  Name:  Phillip Shelton    MRN: [id number] DOB: August 18, 1934  10/20/2019  Phillip Shelton was observed post Covid-19 immunization for 15 minutes without incident. He was provided with Vaccine Information Sheet and instruction to access the V-Safe system.   Phillip Shelton was instructed to call 911 with any severe reactions post vaccine: [name] Difficulty breathing  . Swelling of face and throat  . A fast heartbeat  . A bad rash all over body  . Dizziness and weakness

## 2019-12-10 DIAGNOSIS — Z96649 Presence of unspecified artificial hip joint: Secondary | ICD-10-CM | POA: Insufficient documentation

## 2020-01-18 ENCOUNTER — Other Ambulatory Visit: Payer: Self-pay

## 2020-01-18 ENCOUNTER — Encounter: Payer: Self-pay | Admitting: Internal Medicine

## 2020-01-18 ENCOUNTER — Ambulatory Visit (INDEPENDENT_AMBULATORY_CARE_PROVIDER_SITE_OTHER): Payer: Medicare Other | Admitting: Internal Medicine

## 2020-01-18 VITALS — BP 138/84 | HR 49 | Temp 98.2°F | Ht 73.0 in | Wt 196.0 lb

## 2020-01-18 DIAGNOSIS — K219 Gastro-esophageal reflux disease without esophagitis: Secondary | ICD-10-CM

## 2020-01-18 DIAGNOSIS — M1611 Unilateral primary osteoarthritis, right hip: Secondary | ICD-10-CM | POA: Diagnosis not present

## 2020-01-18 DIAGNOSIS — N400 Enlarged prostate without lower urinary tract symptoms: Secondary | ICD-10-CM | POA: Diagnosis not present

## 2020-01-18 DIAGNOSIS — E7800 Pure hypercholesterolemia, unspecified: Secondary | ICD-10-CM | POA: Diagnosis not present

## 2020-01-18 HISTORY — DX: Benign prostatic hyperplasia without lower urinary tract symptoms: N40.0

## 2020-01-18 NOTE — Patient Instructions (Signed)
Please use your cane at all times  Please continue all other medications as before, and refills have been done if requested.  Please have the pharmacy call with any other refills you may need.  Please continue your efforts at being more active, low cholesterol diet, and weight control.  Please keep your appointments with your specialists as you may have planned  Please make an Appointment to return in 6 months, or sooner if needed

## 2020-01-18 NOTE — Progress Notes (Addendum)
Established Patient Office Visit  Subjective:  Patient ID: Phillip Shelton, male    DOB: March 22, 1934  Age: 86 y.o. MRN: [id number]  .     Chief Complaint: here for 6 mo follow up HLD, DJD, GERD, BPH       HPI:  Phillip Shelton is a 86 y.o. male here to f/u as above, overall doing well without specific complaints except for worsening balance issues, with knees clearly getting worse, now walking with cane most of the time, is clearly high risk, but not amenable for TKR as he has overall little pain.  Takes mobic well and no falls so far.  Does mention tried statin for HLD as of last visit and hard to be specific but if made him "feel bad" and worsening sleep, so stopped and felt better, does not want further statin.  Pt denies chest pain, increased sob or doe, wheezing, orthopnea, PND, increased LE swelling, palpitations, dizziness or syncope.   Pt denies polydipsia, polyuria,  Denies worsening reflux, abd pain, dysphagia, n/v, bowel change or blood.  Denies urinary symptoms such as dysuria, frequency, urgency, flank pain, hematuria or n/v, fever, chills.   Wt Readings from Last 3 Encounters:  01/18/20 196 lb (88.9 kg)  07/18/19 192 lb (87.1 kg)  01/18/19 197 lb 9.6 oz (89.6 kg)   BP Readings from Last 3 Encounters:  01/18/20 138/84  07/18/19 (!) 146/90  01/18/19 128/78      Past Medical History:  Diagnosis Date  . Arthritis   . Blood in stool 02/12/2010   Qualifier: Diagnosis of  By: Julaine Hua CMA ([place]), Estill Bamberg    . BPH (benign prostatic hyperplasia) 01/18/2020  . Complication of anesthesia   . Diverticulosis   . Esophageal stricture   . GERD 02/12/2010   Qualifier: Diagnosis of  By: Julaine Hua CMA ([place]), Estill Bamberg    . GERD (gastroesophageal reflux disease)   . Hemorrhoids   . Hiatal hernia   . Idiopathic Charcot's joint of foot 12/27/2012  . [place] tear 1981  . OAB (overactive bladder) 12/27/2012  . Osteoarthritis of right hip 09/19/2014  . PONV (postoperative nausea and  vomiting)   . Pure hypercholesterolemia 12/27/2012   Past Surgical History:  Procedure Laterality Date  . CHOLECYSTECTOMY    . COLONOSCOPY    . GASTROCNEMIUS RECESSION  02/03/2011   Procedure: GASTROCNEMIUS SLIDE;  Surgeon: Colin Rhein, MD;  Location: [place];  Service: Orthopedics;  Laterality: Right;  . HAND SURGERY  jan 2016   rt hand  . [place]  . TOTAL HIP ARTHROPLASTY Right 09/19/2014   Procedure: RIGHT TOTAL HIP ARTHROPLASTY ANTERIOR APPROACH;  Surgeon: Rod Can, MD;  [facility];  Service: Orthopedics;  Laterality: Right;  . UMBILICAL HERNIA REPAIR    . UPPER GASTROINTESTINAL ENDOSCOPY      reports that he quit smoking about 40 years ago. He has never used smokeless tobacco. He reports current alcohol use. He reports that he does not use drugs. family history includes Arthritis in his father; Colon cancer in his maternal uncle; Hypertension in his father. Allergies  Allergen Reactions  . Lipitor [Atorvastatin] Other (See Comments)    Just feels bad and cant sleep   Current Outpatient Medications on File Prior to Visit  Medication Sig Dispense Refill  . Acetaminophen (TYLENOL 8 HOUR ARTHRITIS PAIN PO) Take by mouth.    . fish oil-omega-3 fatty acids 1000 MG capsule Take 1 g by mouth at bedtime.     [name]  meloxicam (MOBIC) 7.5 MG tablet TAKE 1 TABLET (7.5 MG TOTAL) BY MOUTH DAILY. 90 tablet 1  . Multiple Vitamins-Minerals (MULTIVITAMIN ADULTS PO) Take 1 tablet by mouth daily.    [name] omeprazole (PRILOSEC) 20 MG capsule TAKE 1 CAPSULE BY MOUTH EVERY DAY 90 capsule 3  . silodosin (RAPAFLO) 8 MG CAPS capsule TAKE 1 CAPSULE BY MOUTH EVERYDAY AT BEDTIME 90 capsule 3   No current facility-administered medications on file prior to visit.        ROS:  All others reviewed and negative.  Objective        PE:  BP 138/84   Pulse (!) 49   Temp 98.2 F (36.8 C) (Oral)   Ht 6\' 1"  (1.854 m)   Wt 196 lb (88.9 kg)   SpO2 97%   BMI 25.86 kg/m                  Constitutional: Pt appears in NAD               HENT: Head: NCAT.                Right Ear: External ear normal.                 Left Ear: External ear normal.                Eyes: . Pupils are equal, round, and reactive to light. Conjunctivae and EOM are normal               Nose: without d/c or deformity               Neck: Neck supple. Gross normal ROM               Cardiovascular: Normal rate and regular rhythm.                 Pulmonary/Chest: Effort normal and breath sounds without rales or wheezing.                Abd:  Soft, NT, ND, + BS, no organomegaly               Neurological: Pt is alert. At baseline orientation, motor grossly intact;  bilat knee degenerative changes noted, severe on the right               Skin: Skin is warm. No rashes, no other new lesions, LE edema - none               Psychiatric: Pt behavior is normal without agitation   Assessment/Plan:  Phillip Shelton is a 86 y.o. White or Caucasian [1] male with  has a past medical history of Arthritis, Blood in stool (02/12/2010), BPH (benign prostatic hyperplasia) (3/47/4259), Complication of anesthesia, Diverticulosis, Esophageal stricture, GERD (02/12/2010), GERD (gastroesophageal reflux disease), Hemorrhoids, Hiatal hernia, Idiopathic Charcot's joint of foot (12/27/2012), [place] tear (1981), OAB (overactive bladder) (12/27/2012), Osteoarthritis of right hip (09/19/2014), PONV (postoperative nausea and vomiting), and Pure hypercholesterolemia (12/27/2012).   Assessment Plan  See problem oriented assessment and plan Labs reviewed for each problem: Lab Results  Component Value Date   WBC 6.5 07/18/2019   HGB 14.3 07/18/2019   HCT 42.0 07/18/2019   PLT 237 07/18/2019   GLUCOSE 88 07/18/2019   CHOL 205 (H) 07/18/2019   TRIG 126 07/18/2019   HDL 60 07/18/2019   LDLDIRECT 133.0 12/22/2012   LDLCALC 121 (H) 07/18/2019   ALT 8 (  L) 07/18/2019   AST 13 07/18/2019   NA 138 07/18/2019   K 4.8  07/18/2019   CL 103 07/18/2019   CREATININE 0.98 07/18/2019   BUN 14 07/18/2019   CO2 26 07/18/2019   TSH 1.42 07/18/2019   INR 1.04 09/12/2014   MICROALBUR 3.3 (H) 12/26/2013    Micro: none  Cardiac tracings I have personally interpreted today:  none  Pertinent Radiological findings (summarize): none    There are no preventive care reminders to display for this patient.  There are no preventive care reminders to display for this patient.  Lab Results  Component Value Date   TSH 1.42 07/18/2019   Lab Results  Component Value Date   WBC 6.5 07/18/2019   HGB 14.3 07/18/2019   HCT 42.0 07/18/2019   MCV 94.4 07/18/2019   PLT 237 07/18/2019   Lab Results  Component Value Date   NA 138 07/18/2019   K 4.8 07/18/2019   CO2 26 07/18/2019   GLUCOSE 88 07/18/2019   BUN 14 07/18/2019   CREATININE 0.98 07/18/2019   BILITOT 1.0 07/18/2019   ALKPHOS 68 01/15/2019   AST 13 07/18/2019   ALT 8 (L) 07/18/2019   PROT 6.8 07/18/2019   ALBUMIN 4.2 01/15/2019   CALCIUM 9.7 07/18/2019   ANIONGAP 10 09/20/2014   GFR 85.13 01/15/2019   Lab Results  Component Value Date   CHOL 205 (H) 07/18/2019   Lab Results  Component Value Date   HDL 60 07/18/2019   Lab Results  Component Value Date   LDLCALC 121 (H) 07/18/2019   Lab Results  Component Value Date   TRIG 126 07/18/2019   Lab Results  Component Value Date   CHOLHDL 3.4 07/18/2019   No results found for: HGBA1C    Assessment & Plan:   Problem List Items Addressed This Visit      Medium   Pure hypercholesterolemia - Primary    Lab Results  Component Value Date   LDLCALC 121 (H) 07/18/2019  mild elevated, declines statin retry      GERD    Stable, cont prilosec no change      BPH (benign prostatic hyperplasia)    Stable, cont rapaflo        Low   Osteoarthritis of right hip (Chronic)    And bilat knee pain - cont mobic no change         No orders of the defined types were placed in this  encounter.   Follow-up: Return in about 6 months (around 07/17/2020).   Cathlean Cower, MD 01/18/2020 8:03 PM [facility]

## 2020-01-19 ENCOUNTER — Encounter: Payer: Self-pay | Admitting: Internal Medicine

## 2020-01-19 NOTE — Assessment & Plan Note (Signed)
Lab Results  Component Value Date   LDLCALC 121 (H) 07/18/2019  mild elevated, declines statin retry

## 2020-01-19 NOTE — Assessment & Plan Note (Signed)
And bilat knee pain - cont mobic no change

## 2020-01-19 NOTE — Assessment & Plan Note (Signed)
Stable, cont prilosec no change

## 2020-01-19 NOTE — Assessment & Plan Note (Signed)
Stable, cont rapaflo

## 2020-01-23 DIAGNOSIS — H524 Presbyopia: Secondary | ICD-10-CM | POA: Diagnosis not present

## 2020-01-23 DIAGNOSIS — H2513 Age-related nuclear cataract, bilateral: Secondary | ICD-10-CM | POA: Diagnosis not present

## 2020-03-25 DIAGNOSIS — M5136 Other intervertebral disc degeneration, lumbar region: Secondary | ICD-10-CM | POA: Diagnosis not present

## 2020-03-25 DIAGNOSIS — M4180 Other forms of scoliosis, site unspecified: Secondary | ICD-10-CM | POA: Diagnosis not present

## 2020-04-07 ENCOUNTER — Ambulatory Visit: Payer: Self-pay

## 2020-04-07 ENCOUNTER — Ambulatory Visit: Payer: Medicare Other | Admitting: Orthopedic Surgery

## 2020-04-07 ENCOUNTER — Other Ambulatory Visit: Payer: Self-pay

## 2020-04-07 DIAGNOSIS — M79671 Pain in right foot: Secondary | ICD-10-CM

## 2020-04-08 ENCOUNTER — Encounter: Payer: Self-pay | Admitting: Orthopedic Surgery

## 2020-04-08 NOTE — Progress Notes (Signed)
Office Visit Note   Patient: Phillip Shelton           Date of Birth: Nov 30, 1934           MRN: [id number] Visit Date: 04/07/2020              Requested by: Biagio Borg, MD [address] [place],  [place] [address] PCP: Biagio Borg, MD  Chief Complaint  Patient presents with  . Right Foot - Pain      HPI: Patient is an 86 year old gentleman who presents complaining of right foot pain.  He states the pain is in the arch he has been present for several days he has been using Tylenol ambulates with a cane and only hurts while at rest.  He states he use the cane for his knee symptoms.  Assessment & Plan: Visit Diagnoses:  1. Pain in right foot     Plan: Patient is given a prescription for [employer] for extra-depth shoes custom orthotics recommended Voltaren gel.  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  Follow-Up Instructions: Return if symptoms worsen or fail to improve.   Ortho Exam  Patient is alert, oriented, no adenopathy, well-dressed, normal affect, normal respiratory effort. Examination patient has no ulcers or calluses he does have massive collapse through the midfoot and hindfoot which appears consistent with Charcot arthropathy however patient is not diabetic he has good range of motion of the ankle.  He has a good dorsalis pedis pulses foot is globally tender to palpation.  There is no active Charcot arthropathy process no redness no acute swelling.  Imaging: No results found. No images are attached to the encounter.  Labs: Lab Results  Component Value Date   ESRSEDRATE 22 (H) 07/17/2018     Lab Results  Component Value Date   ALBUMIN 4.2 01/15/2019   ALBUMIN 4.3 07/14/2018   ALBUMIN 4.2 04/15/2017    No results found for: MG Lab Results  Component Value Date   VD25OH 35 07/18/2019   VD25OH 34.52 07/14/2018    No results found for: PREALBUMIN CBC EXTENDED Latest Ref Rng & Units  07/18/2019 01/15/2019 07/14/2018  WBC 3.8 - 10.8 Thousand/uL 6.5 6.0 5.3  RBC 4.20 - 5.80 Million/uL 4.45 4.30 4.45  HGB 13.2 - 17.1 g/dL 14.3 13.6 14.2  HCT 38.5 - 50.0 % 42.0 41.2 42.6  PLT 140 - 400 Thousand/uL 237 237.0 180.0  NEUTROABS 1,500 - 7,800 cells/uL 3,348 - 2.7  LYMPHSABS 850 - 3,900 cells/uL 1,950 - 1.7     There is no height or weight on file to calculate BMI.  Orders:  Orders Placed This Encounter  Procedures  . XR Foot 2 Views Right   No orders of the defined types were placed in this encounter.    Procedures: No procedures performed  Clinical Data: No additional findings.  ROS:  All other systems negative, except as noted in the HPI. Review of Systems  Objective: Vital Signs: There were no vitals taken for this visit.  Specialty Comments:  No specialty comments available.  PMFS History: Patient Active Problem List   Diagnosis Date Noted  . BPH (benign prostatic hyperplasia) 01/18/2020  . Preventative health care 07/18/2019  . Osteoarthritis of right hip 09/19/2014  . Idiopathic Charcot's joint of foot 12/27/2012  . Pure hypercholesterolemia 12/27/2012  . OAB (overactive bladder) 12/27/2012  . Hemorrhoids 08/26/2010  . GERD 02/12/2010   Past Medical History:  Diagnosis  Date  . Arthritis   . Blood in stool 02/12/2010   Qualifier: Diagnosis of  By: Julaine Hua CMA ([place]), Estill Bamberg    . BPH (benign prostatic hyperplasia) 01/18/2020  . Complication of anesthesia   . Diverticulosis   . Esophageal stricture   . GERD 02/12/2010   Qualifier: Diagnosis of  By: Julaine Hua CMA ([place]), Estill Bamberg    . GERD (gastroesophageal reflux disease)   . Hemorrhoids   . Hiatal hernia   . Idiopathic Charcot's joint of foot 12/27/2012  . [place] tear 1981  . OAB (overactive bladder) 12/27/2012  . Osteoarthritis of right hip 09/19/2014  . PONV (postoperative nausea and vomiting)   . Pure hypercholesterolemia 12/27/2012    Family History  Problem Relation Age of  Onset  . Arthritis Father   . Hypertension Father   . Colon cancer Maternal Uncle   . Diabetes Neg Hx   . Heart disease Neg Hx   . Stomach cancer Neg Hx   . Rectal cancer Neg Hx     Past Surgical History:  Procedure Laterality Date  . CHOLECYSTECTOMY    . COLONOSCOPY    . GASTROCNEMIUS RECESSION  02/03/2011   Procedure: GASTROCNEMIUS SLIDE;  Surgeon: Colin Rhein, MD;  Location: [facility];  Service: Orthopedics;  Laterality: Right;  . HAND SURGERY  jan 2016   rt hand  . [place]  . TOTAL HIP ARTHROPLASTY Right 09/19/2014   Procedure: RIGHT TOTAL HIP ARTHROPLASTY ANTERIOR APPROACH;  Surgeon: Rod Can, MD;  Location: [facility];  Service: Orthopedics;  Laterality: Right;  . UMBILICAL HERNIA REPAIR    . UPPER GASTROINTESTINAL ENDOSCOPY     Social History   Occupational History  . Not on file  Tobacco Use  . Smoking status: Former Smoker    Quit date: 01/29/1980    Years since quitting: 40.2  . Smokeless tobacco: Never Used  Substance and Sexual Activity  . Alcohol use: Yes    Alcohol/week: 0.0 standard drinks    Comment: 2 beers daily  . Drug use: No  . Sexual activity: Not on file

## 2020-04-13 ENCOUNTER — Other Ambulatory Visit: Payer: Self-pay | Admitting: Internal Medicine

## 2020-04-22 ENCOUNTER — Ambulatory Visit (INDEPENDENT_AMBULATORY_CARE_PROVIDER_SITE_OTHER): Payer: Medicare Other | Admitting: Family

## 2020-04-22 ENCOUNTER — Encounter: Payer: Self-pay | Admitting: Family

## 2020-04-22 DIAGNOSIS — M79671 Pain in right foot: Secondary | ICD-10-CM

## 2020-04-22 MED ORDER — GABAPENTIN 100 MG PO CAPS
100.0000 mg | ORAL_CAPSULE | Freq: Every day | ORAL | 3 refills | Status: DC
Start: 2020-04-22 — End: 2020-07-18

## 2020-04-23 NOTE — Progress Notes (Signed)
Office Visit Note   Patient: Phillip Shelton           Date of Birth: 06-07-1934           MRN: [id number] Visit Date: 04/22/2020              Requested by: Biagio Borg, MD [address] [place],  [place] [address] PCP: Biagio Borg, MD  Chief Complaint  Patient presents with  . Right Foot - Follow-up      HPI: The patient is an 86 year old gentleman who presents today for 2-week follow-up of right foot pain.  He is complaining of medial sided foot and ankle pain this predominantly occurs when he is at rest especially in the evenings..  Denies any pain with weightbearing he is able to do yard work without pain.  Pain in his arch as well as extending over the deltoids.  He has been to biotech and been molded for new custom orthotics as well as some new extra-depth shoes.  These are not in yet.  He has been using Tylenol and Voltaren gel he states in the evening after he uses the gel and takes Tylenol a couple hours later his pain might ease off and he will be able to fall asleep.  He feels quite strongly that this is nerve type pain.  Describes as a shooting he has not had any associated numbness or tingling  Assessment & Plan: Visit Diagnoses:  No diagnosis found.  Plan: He will continue with his Tylenol and Voltaren.  We will trial him on gabapentin 100 mg at bedtime he will follow-up in 4 weeks to discuss his right foot pain.  Follow-Up Instructions: Return in about 4 weeks (around 05/20/2020), or c duda.   Ortho Exam  Patient is alert, oriented, no adenopathy, well-dressed, normal affect, normal respiratory effort. Examination patient has no ulcers or calluses. he has good range of motion of the ankle.  He has a good dorsalis pedis pulses. foot is nontender to palpation today.  There is no active Charcot arthropathy process no redness no acute swelling.  Imaging: No results found. No images are attached to the encounter.  Labs: Lab Results  Component Value Date    ESRSEDRATE 22 (H) 07/17/2018     Lab Results  Component Value Date   ALBUMIN 4.2 01/15/2019   ALBUMIN 4.3 07/14/2018   ALBUMIN 4.2 04/15/2017    No results found for: MG Lab Results  Component Value Date   VD25OH 35 07/18/2019   VD25OH 34.52 07/14/2018    No results found for: PREALBUMIN CBC EXTENDED Latest Ref Rng & Units 07/18/2019 01/15/2019 07/14/2018  WBC 3.8 - 10.8 Thousand/uL 6.5 6.0 5.3  RBC 4.20 - 5.80 Million/uL 4.45 4.30 4.45  HGB 13.2 - 17.1 g/dL 14.3 13.6 14.2  HCT 38.5 - 50.0 % 42.0 41.2 42.6  PLT 140 - 400 Thousand/uL 237 237.0 180.0  NEUTROABS 1,500 - 7,800 cells/uL 3,348 - 2.7  LYMPHSABS 850 - 3,900 cells/uL 1,950 - 1.7     There is no height or weight on file to calculate BMI.  Orders:  No orders of the defined types were placed in this encounter.  Meds ordered this encounter  Medications  . gabapentin (NEURONTIN) 100 MG capsule    Sig: Take 1 capsule (100 mg total) by mouth at bedtime.    Dispense:  30 capsule    Refill:  3     Procedures: No procedures performed  Clinical Data:  No additional findings.  ROS:  All other systems negative, except as noted in the HPI. Review of Systems  Constitutional: Negative for chills and fever.  Musculoskeletal: Positive for arthralgias and myalgias.  Neurological: Negative for weakness and numbness.    Objective: Vital Signs: There were no vitals taken for this visit.  Specialty Comments:  No specialty comments available.  PMFS History: Patient Active Problem List   Diagnosis Date Noted  . BPH (benign prostatic hyperplasia) 01/18/2020  . Preventative health care 07/18/2019  . Osteoarthritis of right hip 09/19/2014  . Idiopathic Charcot's joint of foot 12/27/2012  . Pure hypercholesterolemia 12/27/2012  . OAB (overactive bladder) 12/27/2012  . Hemorrhoids 08/26/2010  . GERD 02/12/2010   Past Medical History:  Diagnosis Date  . Arthritis   . Blood in stool 02/12/2010   Qualifier: Diagnosis  of  By: Julaine Hua CMA ([place]), Estill Bamberg    . BPH (benign prostatic hyperplasia) 01/18/2020  . Complication of anesthesia   . Diverticulosis   . Esophageal stricture   . GERD 02/12/2010   Qualifier: Diagnosis of  By: Julaine Hua CMA ([place]), Estill Bamberg    . GERD (gastroesophageal reflux disease)   . Hemorrhoids   . Hiatal hernia   . Idiopathic Charcot's joint of foot 12/27/2012  . [place] tear 1981  . OAB (overactive bladder) 12/27/2012  . Osteoarthritis of right hip 09/19/2014  . PONV (postoperative nausea and vomiting)   . Pure hypercholesterolemia 12/27/2012    Family History  Problem Relation Age of Onset  . Arthritis Father   . Hypertension Father   . Colon cancer Maternal Uncle   . Diabetes Neg Hx   . Heart disease Neg Hx   . Stomach cancer Neg Hx   . Rectal cancer Neg Hx     Past Surgical History:  Procedure Laterality Date  . CHOLECYSTECTOMY    . COLONOSCOPY    . GASTROCNEMIUS RECESSION  02/03/2011   Procedure: GASTROCNEMIUS SLIDE;  Surgeon: Colin Rhein, MD;  Location: [facility];  Service: Orthopedics;  Laterality: Right;  . HAND SURGERY  jan 2016   rt hand  . [place]  . TOTAL HIP ARTHROPLASTY Right 09/19/2014   Procedure: RIGHT TOTAL HIP ARTHROPLASTY ANTERIOR APPROACH;  Surgeon: Rod Can, MD;  Location: [facility];  Service: Orthopedics;  Laterality: Right;  . UMBILICAL HERNIA REPAIR    . UPPER GASTROINTESTINAL ENDOSCOPY     Social History   Occupational History  . Not on file  Tobacco Use  . Smoking status: Former Smoker    Quit date: 01/29/1980    Years since quitting: 40.2  . Smokeless tobacco: Never Used  Substance and Sexual Activity  . Alcohol use: Yes    Alcohol/week: 0.0 standard drinks    Comment: 2 beers daily  . Drug use: No  . Sexual activity: Not on file

## 2020-04-24 DIAGNOSIS — M1712 Unilateral primary osteoarthritis, left knee: Secondary | ICD-10-CM | POA: Insufficient documentation

## 2020-04-25 DIAGNOSIS — M1711 Unilateral primary osteoarthritis, right knee: Secondary | ICD-10-CM | POA: Diagnosis not present

## 2020-05-13 DIAGNOSIS — L259 Unspecified contact dermatitis, unspecified cause: Secondary | ICD-10-CM | POA: Diagnosis not present

## 2020-05-13 DIAGNOSIS — R21 Rash and other nonspecific skin eruption: Secondary | ICD-10-CM | POA: Diagnosis not present

## 2020-05-27 DIAGNOSIS — H6121 Impacted cerumen, right ear: Secondary | ICD-10-CM | POA: Diagnosis not present

## 2020-06-24 DIAGNOSIS — Z85828 Personal history of other malignant neoplasm of skin: Secondary | ICD-10-CM | POA: Diagnosis not present

## 2020-06-24 DIAGNOSIS — L821 Other seborrheic keratosis: Secondary | ICD-10-CM | POA: Diagnosis not present

## 2020-06-24 DIAGNOSIS — D0421 Carcinoma in situ of skin of right ear and external auricular canal: Secondary | ICD-10-CM | POA: Diagnosis not present

## 2020-06-24 DIAGNOSIS — L570 Actinic keratosis: Secondary | ICD-10-CM | POA: Diagnosis not present

## 2020-06-24 DIAGNOSIS — L814 Other melanin hyperpigmentation: Secondary | ICD-10-CM | POA: Diagnosis not present

## 2020-06-24 DIAGNOSIS — D1801 Hemangioma of skin and subcutaneous tissue: Secondary | ICD-10-CM | POA: Diagnosis not present

## 2020-07-17 ENCOUNTER — Other Ambulatory Visit: Payer: Self-pay | Admitting: Internal Medicine

## 2020-07-17 NOTE — Telephone Encounter (Signed)
Please refill as per office routine med refill policy (all routine meds refilled for 3 mo or monthly per pt preference up to one year from last visit, then month to month grace period for 3 mo, then further med refills will have to be denied)  

## 2020-07-18 ENCOUNTER — Other Ambulatory Visit: Payer: Self-pay

## 2020-07-18 ENCOUNTER — Ambulatory Visit (INDEPENDENT_AMBULATORY_CARE_PROVIDER_SITE_OTHER): Payer: Medicare Other | Admitting: Internal Medicine

## 2020-07-18 VITALS — BP 132/70 | HR 60 | Temp 97.8°F | Ht 73.0 in | Wt 190.0 lb

## 2020-07-18 DIAGNOSIS — G720 Drug-induced myopathy: Secondary | ICD-10-CM | POA: Insufficient documentation

## 2020-07-18 DIAGNOSIS — R269 Unspecified abnormalities of gait and mobility: Secondary | ICD-10-CM | POA: Diagnosis not present

## 2020-07-18 DIAGNOSIS — E538 Deficiency of other specified B group vitamins: Secondary | ICD-10-CM | POA: Diagnosis not present

## 2020-07-18 DIAGNOSIS — E7800 Pure hypercholesterolemia, unspecified: Secondary | ICD-10-CM | POA: Diagnosis not present

## 2020-07-18 DIAGNOSIS — T466X5A Adverse effect of antihyperlipidemic and antiarteriosclerotic drugs, initial encounter: Secondary | ICD-10-CM

## 2020-07-18 DIAGNOSIS — K219 Gastro-esophageal reflux disease without esophagitis: Secondary | ICD-10-CM

## 2020-07-18 DIAGNOSIS — E559 Vitamin D deficiency, unspecified: Secondary | ICD-10-CM

## 2020-07-18 DIAGNOSIS — Z0000 Encounter for general adult medical examination without abnormal findings: Secondary | ICD-10-CM

## 2020-07-18 DIAGNOSIS — Z0001 Encounter for general adult medical examination with abnormal findings: Secondary | ICD-10-CM

## 2020-07-18 LAB — URINALYSIS, ROUTINE W REFLEX MICROSCOPIC
Bilirubin Urine: NEGATIVE
Hgb urine dipstick: NEGATIVE
Ketones, ur: NEGATIVE
Leukocytes,Ua: NEGATIVE
Nitrite: NEGATIVE
RBC / HPF: NONE SEEN (ref 0–?)
Specific Gravity, Urine: 1.01 (ref 1.000–1.030)
Total Protein, Urine: NEGATIVE
Urine Glucose: NEGATIVE
Urobilinogen, UA: 0.2 (ref 0.0–1.0)
pH: 6 (ref 5.0–8.0)

## 2020-07-18 LAB — CBC WITH DIFFERENTIAL/PLATELET
Basophils Absolute: 0 10*3/uL (ref 0.0–0.1)
Basophils Relative: 0.8 % (ref 0.0–3.0)
Eosinophils Absolute: 0.1 10*3/uL (ref 0.0–0.7)
Eosinophils Relative: 1.7 % (ref 0.0–5.0)
HCT: 41.4 % (ref 39.0–52.0)
Hemoglobin: 14.1 g/dL (ref 13.0–17.0)
Lymphocytes Relative: 27.3 % (ref 12.0–46.0)
Lymphs Abs: 1.6 10*3/uL (ref 0.7–4.0)
MCHC: 34.1 g/dL (ref 30.0–36.0)
MCV: 94.7 fl (ref 78.0–100.0)
Monocytes Absolute: 0.8 10*3/uL (ref 0.1–1.0)
Monocytes Relative: 13.9 % — ABNORMAL HIGH (ref 3.0–12.0)
Neutro Abs: 3.3 10*3/uL (ref 1.4–7.7)
Neutrophils Relative %: 56.3 % (ref 43.0–77.0)
Platelets: 253 10*3/uL (ref 150.0–400.0)
RBC: 4.37 Mil/uL (ref 4.22–5.81)
RDW: 13.6 % (ref 11.5–15.5)
WBC: 5.8 10*3/uL (ref 4.0–10.5)

## 2020-07-18 LAB — BASIC METABOLIC PANEL
BUN: 14 mg/dL (ref 6–23)
CO2: 27 mEq/L (ref 19–32)
Calcium: 9.6 mg/dL (ref 8.4–10.5)
Chloride: 99 mEq/L (ref 96–112)
Creatinine, Ser: 0.81 mg/dL (ref 0.40–1.50)
GFR: 81.5 mL/min (ref >60.00)
Glucose, Bld: 92 mg/dL (ref 70–99)
Potassium: 4.2 mEq/L (ref 3.5–5.1)
Sodium: 133 mEq/L — ABNORMAL LOW (ref 135–145)

## 2020-07-18 LAB — LIPID PANEL
Cholesterol: 178 mg/dL (ref 0–200)
HDL: 62.4 mg/dL (ref >39.00)
LDL Cholesterol: 95 mg/dL (ref 0–99)
NonHDL: 115.73
Total CHOL/HDL Ratio: 3
Triglycerides: 103 mg/dL (ref 0.0–149.0)
VLDL: 20.6 mg/dL (ref 0.0–40.0)

## 2020-07-18 LAB — HEPATIC FUNCTION PANEL
ALT: 11 U/L (ref 0–53)
AST: 18 U/L (ref 0–37)
Albumin: 4.4 g/dL (ref 3.5–5.2)
Alkaline Phosphatase: 69 U/L (ref 39–117)
Bilirubin, Direct: 0.1 mg/dL (ref 0.0–0.3)
Total Bilirubin: 0.7 mg/dL (ref 0.2–1.2)
Total Protein: 7.5 g/dL (ref 6.0–8.3)

## 2020-07-18 LAB — VITAMIN B12: Vitamin B-12: 343 pg/mL (ref 211–911)

## 2020-07-18 LAB — VITAMIN D 25 HYDROXY (VIT D DEFICIENCY, FRACTURES): VITD: 42.81 ng/mL (ref 30.00–100.00)

## 2020-07-18 LAB — TSH: TSH: 1.3 u[IU]/mL (ref 0.35–5.50)

## 2020-07-18 MED ORDER — MELOXICAM 7.5 MG PO TABS: ORAL_TABLET | ORAL | 1 refills | Status: DC

## 2020-07-18 MED ORDER — OMEPRAZOLE 20 MG PO CPDR: 20.0000 mg | DELAYED_RELEASE_CAPSULE | Freq: Every day | ORAL | 3 refills | Status: DC | PRN

## 2020-07-18 MED ORDER — SILODOSIN 8 MG PO CAPS: ORAL_CAPSULE | ORAL | 3 refills | Status: DC

## 2020-07-18 NOTE — Progress Notes (Signed)
Patient ID: TIN ENGRAM, male   DOB: 1934-10-13, 86 y.o.   MRN: [id number]         Chief Complaint:: wellness exam and Follow-up  Statin myopathy, hld, gait unstable       HPI:  Phillip Shelton is a 86 y.o. male here for wellness exam; declines covid booster and shingrix, o/w up to date with preventive referrals and immunizations                        Also with 2-3 mo increased gait difficulty to walking longer distances over 50 yrds but no falls, is c/w bilateral knee DJD but fortunately o/w has no pain, has seen orthopedic, had cortisone for effusions, but no surgury recommended due to no pain.  Pt denies chest pain, increased sob or doe, wheezing, orthopnea, PND, increased LE swelling, palpitations, dizziness or syncope.   Pt denies polydipsia, polyuria, or new focal neuro s/s.   Pt denies fever, wt loss, night sweats, loss of appetite, or other constitutional symptoms  No other new complaints   Has not been able to tolerate statins in past due to myopathy, but not willing for now for CT cardiac score testing or lipid clinic referral.  Denies worsening reflux, abd pain, dysphagia, n/v, bowel change or blood.   Wt Readings from Last 3 Encounters:  07/18/20 190 lb (86.2 kg)  01/18/20 196 lb (88.9 kg)  07/18/19 192 lb (87.1 kg)   BP Readings from Last 3 Encounters:  07/18/20 132/70  01/18/20 138/84  07/18/19 (!) 146/90   Immunization History  Administered Date(s) Administered   Fluad Quad(high Dose 65+) 10/04/2018, 10/02/2019   Influenza, High Dose Seasonal PF 10/21/2015, 10/11/2016, 10/21/2017   Influenza,inj,Quad PF,6+ Mos 10/13/2012, 10/29/2013, 11/20/2014   PFIZER(Purple Top)SARS-COV-2 Vaccination 02/10/2019, 03/07/2019, 10/20/2019   Pneumococcal Conjugate-13 12/26/2013   Pneumococcal Polysaccharide-23 12/03/2004, 04/20/2016   Tdap 03/27/2014   Zoster, Live 07/28/2010   There are no preventive care reminders to display for this patient.     Past Medical History:   Diagnosis Date   Arthritis    Blood in stool 02/12/2010   Qualifier: Diagnosis of  By: Julaine Hua CMA ([facility]), [place]     BPH (benign prostatic hyperplasia) 01/05/7508   Complication of anesthesia    Diverticulosis    Esophageal stricture    GERD 02/12/2010   Qualifier: Diagnosis of  By: Julaine Hua CMA ([facility]), Estill Bamberg     GERD (gastroesophageal reflux disease)    Hemorrhoids    Hiatal hernia    Idiopathic Charcot's joint of foot 12/27/2012   [place] - Weiss tear 1981   OAB (overactive bladder) 12/27/2012   Osteoarthritis of right hip 09/19/2014   PONV (postoperative nausea and vomiting)    Pure hypercholesterolemia 12/27/2012   Past Surgical History:  Procedure Laterality Date   CHOLECYSTECTOMY     COLONOSCOPY     GASTROCNEMIUS RECESSION  02/03/2011   Procedure: GASTROCNEMIUS SLIDE;  Surgeon: Colin Rhein, MD;  Location: [facility];  Service: Orthopedics;  Laterality: Right;   HAND SURGERY  jan 2016   rt hand   HEMORRHOID SURGERY  1990   TOTAL HIP ARTHROPLASTY Right 09/19/2014   Procedure: RIGHT TOTAL HIP ARTHROPLASTY ANTERIOR APPROACH;  Surgeon: Rod Can, MD;  Location[facility];  Service: Orthopedics;  Laterality: Right;   UMBILICAL HERNIA REPAIR     UPPER GASTROINTESTINAL ENDOSCOPY      reports that he quit smoking about 40 years ago. He has  never used smokeless tobacco. He reports current alcohol use. He reports that he does not use drugs. family history includes Arthritis in his father; Colon cancer in his maternal uncle; Hypertension in his father. Allergies  Allergen Reactions   Lipitor [Atorvastatin] Other (See Comments)    Just feels bad and cant sleep   Current Outpatient Medications on File Prior to Visit  Medication Sig Dispense Refill   Acetaminophen (TYLENOL 8 HOUR ARTHRITIS PAIN PO) Take by mouth.     fish oil-omega-3 fatty acids 1000 MG capsule Take 1 g by mouth at bedtime.      Multiple Vitamins-Minerals (MULTIVITAMIN ADULTS PO) Take 1 tablet by  mouth daily.     No current facility-administered medications on file prior to visit.        ROS:  All others reviewed and negative.  Objective        PE:  BP 132/70   Pulse 60   Temp 97.8 F (36.6 C) (Oral)   Ht 6\' 1"  (1.854 m)   Wt 190 lb (86.2 kg)   SpO2 97%   BMI 25.07 kg/m                 Constitutional: Pt appears in NAD               HENT: Head: NCAT.                Right Ear: External ear normal.                 Left Ear: External ear normal.                Eyes: . Pupils are equal, round, and reactive to light. Conjunctivae and EOM are normal               Nose: without d/c or deformity               Neck: Neck supple. Gross normal ROM               Cardiovascular: Normal rate and regular rhythm.                 Pulmonary/Chest: Effort normal and breath sounds without rales or wheezing.                Abd:  Soft, NT, ND, + BS, no organomegaly               Neurological: Pt is alert. At baseline orientation, motor grossly intact               Skin: Skin is warm. No rashes, no other new lesions, LE edema - none               Psychiatric: Pt behavior is normal without agitation   Micro: none  Cardiac tracings I have personally interpreted today:  none  Pertinent Radiological findings (summarize): none   Lab Results  Component Value Date   WBC 5.8 07/18/2020   HGB 14.1 07/18/2020   HCT 41.4 07/18/2020   PLT 253.0 07/18/2020   GLUCOSE 92 07/18/2020   CHOL 178 07/18/2020   TRIG 103.0 07/18/2020   HDL 62.40 07/18/2020   LDLDIRECT 133.0 12/22/2012   LDLCALC 95 07/18/2020   ALT 11 07/18/2020   AST 18 07/18/2020   NA 133 (L) 07/18/2020   K 4.2 07/18/2020   CL 99 07/18/2020   CREATININE 0.81 07/18/2020   BUN 14 07/18/2020   CO2 27  07/18/2020   TSH 1.30 07/18/2020   INR 1.04 09/12/2014   MICROALBUR 3.3 (H) 12/26/2013   Assessment/Plan:  Phillip Shelton is a 86 y.o. White or Caucasian [1] male with  has a past medical history of Arthritis, Blood in stool  (02/12/2010), BPH (benign prostatic hyperplasia) (1/91/6606), Complication of anesthesia, Diverticulosis, Esophageal stricture, GERD (02/12/2010), GERD (gastroesophageal reflux disease), Hemorrhoids, Hiatal hernia, Idiopathic Charcot's joint of foot (12/27/2012), [place] - Weiss tear (1981), OAB (overactive bladder) (12/27/2012), Osteoarthritis of right hip (09/19/2014), PONV (postoperative nausea and vomiting), and Pure hypercholesterolemia (12/27/2012).  Encounter for well adult exam with abnormal findings Age and sex appropriate education and counseling updated with regular exercise and diet Referrals for preventative services - none needed Immunizations addressed - declines covid booster and shingrix Smoking counseling  - none needed Evidence for depression or other mood disorder - none significant Most recent labs reviewed. I have personally reviewed and have noted: 1) the patient's medical and social history 2) The patient's current medications and supplements 3) The patient's height, weight, and BMI have been recorded in the chart   Gait disorder Mild worsening recently, decliens PT, Pt to use cane with walking outside the home for safety and balance  GERD Stable, for contd PPI,  to f/u any worsening symptoms or concerns  Pure hypercholesterolemia Lab Results  Component Value Date   [place] 95 07/18/2020   stable, pt to continue current statin, declines cT cardiac score or lipid clinic referral, for lower chol diet     Current Outpatient Medications (Analgesics):    Acetaminophen (TYLENOL 8 HOUR ARTHRITIS PAIN PO), Take by mouth.   meloxicam (MOBIC) 7.5 MG tablet, TAKE 1 TABLET BY MOUTH EVERY DAY   Current Outpatient Medications (Other):    fish oil-omega-3 fatty acids 1000 MG capsule, Take 1 g by mouth at bedtime.    Multiple Vitamins-Minerals (MULTIVITAMIN ADULTS PO), Take 1 tablet by mouth daily.   omeprazole (PRILOSEC) 20 MG capsule, Take 1 capsule (20 mg total) by mouth  daily as needed.   silodosin (RAPAFLO) 8 MG CAPS capsule, TAKE 1 CAPSULE BY MOUTH EVERYDAY AT BEDTIME   Statin myopathy Remains statin intolerant, cont as above  Followup: Return in about 1 year (around 07/18/2021).  Cathlean Cower, MD 07/19/2020 11:09 PM [facility]

## 2020-07-18 NOTE — Patient Instructions (Signed)

## 2020-07-19 ENCOUNTER — Encounter: Payer: Self-pay | Admitting: Internal Medicine

## 2020-07-19 DIAGNOSIS — R269 Unspecified abnormalities of gait and mobility: Secondary | ICD-10-CM | POA: Insufficient documentation

## 2020-07-19 NOTE — Assessment & Plan Note (Signed)
Remains statin intolerant, cont as above

## 2020-07-19 NOTE — Assessment & Plan Note (Signed)
Stable, for contd PPI,  to f/u any worsening symptoms or concerns

## 2020-07-19 NOTE — Assessment & Plan Note (Signed)
Lab Results  Component Value Date   [place] 07/18/2020   stable, pt to continue current statin, declines cT cardiac score or lipid clinic referral, for lower chol diet     Current Outpatient Medications (Analgesics):  [name]  Acetaminophen (TYLENOL 8 HOUR ARTHRITIS PAIN PO), Take by mouth. .  meloxicam (MOBIC) 7.5 MG tablet, TAKE 1 TABLET BY MOUTH EVERY DAY   Current Outpatient Medications (Other):  .  fish oil-omega-3 fatty acids 1000 MG capsule, Take 1 g by mouth at bedtime.  .  Multiple Vitamins-Minerals (MULTIVITAMIN ADULTS PO), Take 1 tablet by mouth daily. [name]  omeprazole (PRILOSEC) 20 MG capsule, Take 1 capsule (20 mg total) by mouth daily as needed. .  silodosin (RAPAFLO) 8 MG CAPS capsule, TAKE 1 CAPSULE BY MOUTH EVERYDAY AT BEDTIME

## 2020-07-19 NOTE — Assessment & Plan Note (Addendum)
Mild worsening recently, decliens PT, Pt to use cane with walking outside the home for safety and balance

## 2020-07-19 NOTE — Assessment & Plan Note (Signed)
Age and sex appropriate education and counseling updated with regular exercise and diet Referrals for preventative services - none needed Immunizations addressed - declines covid booster and shingrix Smoking counseling  - none needed Evidence for depression or other mood disorder - none significant Most recent labs reviewed. I have personally reviewed and have noted: 1) the patient's medical and social history 2) The patient's current medications and supplements 3) The patient's height, weight, and BMI have been recorded in the chart  

## 2020-09-09 DIAGNOSIS — H10413 Chronic giant papillary conjunctivitis, bilateral: Secondary | ICD-10-CM | POA: Diagnosis not present

## 2020-09-23 DIAGNOSIS — R0981 Nasal congestion: Secondary | ICD-10-CM | POA: Diagnosis not present

## 2020-09-26 DIAGNOSIS — K120 Recurrent oral aphthae: Secondary | ICD-10-CM | POA: Diagnosis not present

## 2020-09-26 DIAGNOSIS — J029 Acute pharyngitis, unspecified: Secondary | ICD-10-CM | POA: Diagnosis not present

## 2020-10-01 ENCOUNTER — Encounter: Payer: Self-pay | Admitting: Internal Medicine

## 2020-10-01 ENCOUNTER — Other Ambulatory Visit: Payer: Self-pay

## 2020-10-01 ENCOUNTER — Ambulatory Visit (INDEPENDENT_AMBULATORY_CARE_PROVIDER_SITE_OTHER): Payer: Medicare Other

## 2020-10-01 VITALS — BP 126/70 | HR 71 | Temp 98.4°F | Resp 16 | Ht 73.0 in | Wt 190.2 lb

## 2020-10-01 DIAGNOSIS — Z0000 Encounter for general adult medical examination without abnormal findings: Secondary | ICD-10-CM | POA: Diagnosis not present

## 2020-10-01 DIAGNOSIS — Z23 Encounter for immunization: Secondary | ICD-10-CM | POA: Diagnosis not present

## 2020-10-01 NOTE — Patient Instructions (Signed)
Phillip Shelton , Thank you for taking time to come for your Medicare Wellness Visit. I appreciate your ongoing commitment to your health goals. Please review the following plan we discussed and let me know if I can assist you in the future.   Screening recommendations/referrals: Colonoscopy: 06/26/2015; not a candidate for screening due to age. Recommended yearly ophthalmology/optometry visit for glaucoma screening and checkup Recommended yearly dental visit for hygiene and checkup  Vaccinations: Influenza vaccine: 10/01/2020 Pneumococcal vaccine: 12/26/2013, 04/20/2016 Tdap vaccine: 03/27/2014; due every 10 years Shingles vaccine: never done   Covid-19: 02/10/2019, 03/07/2019, 10/20/2019  Advanced directives: Advance directive discussed with you today. Even though you declined this today please call our office should you change your mind and we can give you the proper paperwork for you to fill out.  Conditions/risks identified: Yes; Client understands the importance of follow-up with providers by attending scheduled visits and discussed goals to eat healthier, increase physical activity, exercise the brain, socialize more, get enough sleep and make time for laughter.  Next appointment: Please schedule your next Medicare Wellness Visit with your Nurse Health Advisor in 1 year by calling [phone].  Preventive Care 72 Years and Older, Male Preventive care refers to lifestyle choices and visits with your health care provider that can promote health and wellness. What does preventive care include? A yearly physical exam. This is also called an annual well check. Dental exams once or twice a year. Routine eye exams. Ask your health care provider how often you should have your eyes checked. Personal lifestyle choices, including: Daily care of your teeth and gums. Regular physical activity. Eating a healthy diet. Avoiding tobacco and drug use. Limiting alcohol use. Practicing safe sex. Taking low  doses of aspirin every day. Taking vitamin and mineral supplements as recommended by your health care provider. What happens during an annual well check? The services and screenings done by your health care provider during your annual well check will depend on your age, overall health, lifestyle risk factors, and family history of disease. Counseling  Your health care provider may ask you questions about your: Alcohol use. Tobacco use. Drug use. Emotional well-being. Home and relationship well-being. Sexual activity. Eating habits. History of falls. Memory and ability to understand (cognition). Work and work Statistician. Screening  You may have the following tests or measurements: Height, weight, and BMI. Blood pressure. Lipid and cholesterol levels. These may be checked every 5 years, or more frequently if you are over 73 years old. Skin check. Lung cancer screening. You may have this screening every year starting at age 79 if you have a 30-pack-year history of smoking and currently smoke or have quit within the past 15 years. Fecal occult blood test (FOBT) of the stool. You may have this test every year starting at age 31. Flexible sigmoidoscopy or colonoscopy. You may have a sigmoidoscopy every 5 years or a colonoscopy every 10 years starting at age 35. Prostate cancer screening. Recommendations will vary depending on your family history and other risks. Hepatitis C blood test. Hepatitis B blood test. Sexually transmitted disease (STD) testing. Diabetes screening. This is done by checking your blood sugar (glucose) after you have not eaten for a while (fasting). You may have this done every 1-3 years. Abdominal aortic aneurysm (AAA) screening. You may need this if you are a current or former smoker. Osteoporosis. You may be screened starting at age 28 if you are at high risk. Talk with your health care provider about your test results,  treatment options, and if necessary, the need  for more tests. Vaccines  Your health care provider may recommend certain vaccines, such as: Influenza vaccine. This is recommended every year. Tetanus, diphtheria, and acellular pertussis (Tdap, Td) vaccine. You may need a Td booster every 10 years. Zoster vaccine. You may need this after age 25. Pneumococcal 13-valent conjugate (PCV13) vaccine. One dose is recommended after age 22. Pneumococcal polysaccharide (PPSV23) vaccine. One dose is recommended after age 74. Talk to your health care provider about which screenings and vaccines you need and how often you need them. This information is not intended to replace advice given to you by your health care provider. Make sure you discuss any questions you have with your health care provider. Document Released: 01/17/2015 Document Revised: 09/10/2015 Document Reviewed: 10/22/2014 Elsevier Interactive Patient Education  2017 [place] Prevention in the Home Falls can cause injuries. They can happen to people of all ages. There are many things you can do to make your home safe and to help prevent falls. What can I do on the outside of my home? Regularly fix the edges of walkways and driveways and fix any cracks. Remove anything that might make you trip as you walk through a door, such as a raised step or threshold. Trim any bushes or trees on the path to your home. Use bright outdoor lighting. Clear any walking paths of anything that might make someone trip, such as rocks or tools. Regularly check to see if handrails are loose or broken. Make sure that both sides of any steps have handrails. Any raised decks and porches should have guardrails on the edges. Have any leaves, snow, or ice cleared regularly. Use sand or salt on walking paths during winter. Clean up any spills in your garage right away. This includes oil or grease spills. What can I do in the bathroom? Use night lights. Install grab bars by the toilet and in the tub and  shower. Do not use towel bars as grab bars. Use non-skid mats or decals in the tub or shower. If you need to sit down in the shower, use a plastic, non-slip stool. Keep the floor dry. Clean up any water that spills on the floor as soon as it happens. Remove soap buildup in the tub or shower regularly. Attach bath mats securely with double-sided non-slip rug tape. Do not have throw rugs and other things on the floor that can make you trip. What can I do in the bedroom? Use night lights. Make sure that you have a light by your bed that is easy to reach. Do not use any sheets or blankets that are too big for your bed. They should not hang down onto the floor. Have a firm chair that has side arms. You can use this for support while you get dressed. Do not have throw rugs and other things on the floor that can make you trip. What can I do in the kitchen? Clean up any spills right away. Avoid walking on wet floors. Keep items that you use a lot in easy-to-reach places. If you need to reach something above you, use a strong step stool that has a grab bar. Keep electrical cords out of the way. Do not use floor polish or wax that makes floors slippery. If you must use wax, use non-skid floor wax. Do not have throw rugs and other things on the floor that can make you trip. What can I do with my stairs? Do  not leave any items on the stairs. Make sure that there are handrails on both sides of the stairs and use them. Fix handrails that are broken or loose. Make sure that handrails are as long as the stairways. Check any carpeting to make sure that it is firmly attached to the stairs. Fix any carpet that is loose or worn. Avoid having throw rugs at the top or bottom of the stairs. If you do have throw rugs, attach them to the floor with carpet tape. Make sure that you have a light switch at the top of the stairs and the bottom of the stairs. If you do not have them, ask someone to add them for  you. What else can I do to help prevent falls? Wear shoes that: Do not have high heels. Have rubber bottoms. Are comfortable and fit you well. Are closed at the toe. Do not wear sandals. If you use a stepladder: Make sure that it is fully opened. Do not climb a closed stepladder. Make sure that both sides of the stepladder are locked into place. Ask someone to hold it for you, if possible. Clearly mark and make sure that you can see: Any grab bars or handrails. First and last steps. Where the edge of each step is. Use tools that help you move around (mobility aids) if they are needed. These include: Canes. Walkers. Scooters. Crutches. Turn on the lights when you go into a dark area. Replace any light bulbs as soon as they burn out. Set up your furniture so you have a clear path. Avoid moving your furniture around. If any of your floors are uneven, fix them. If there are any pets around you, be aware of where they are. Review your medicines with your doctor. Some medicines can make you feel dizzy. This can increase your chance of falling. Ask your doctor what other things that you can do to help prevent falls. This information is not intended to replace advice given to you by your health care provider. Make sure you discuss any questions you have with your health care provider. Document Released: 10/17/2008 Document Revised: 05/29/2015 Document Reviewed: 01/25/2014 Elsevier Interactive Patient Education  2017 [employer].

## 2020-10-01 NOTE — Progress Notes (Addendum)
Subjective:   Phillip Shelton is a 86 y.o. male who presents for an Initial Medicare Annual Wellness Visit.  Review of Systems     Cardiac Risk Factors include: advanced age (>74men, >50 women);dyslipidemia;family history of premature cardiovascular disease;male gender     Objective:    Today's Vitals   10/01/20 1436  BP: 126/70  Pulse: 71  Resp: 16  Temp: 98.4 F (36.9 C)  SpO2: 97%  Weight: 190 lb 3.2 oz (86.3 kg)  Height: 6\' 1"  (1.854 m)  PainSc: 0-No pain   Body mass index is 25.09 kg/m.  Advanced Directives 10/01/2020 01/02/2019 06/26/2015 06/12/2015 09/19/2014 09/12/2014 06/27/2014  Does Patient Have a Medical Advance Directive? No No No No No No No  Would patient like information on creating a medical advance directive? No - Patient declined Yes (ED - Information included in AVS) - - No - patient declined information No - patient declined information No - patient declined information    Current Medications (verified) Outpatient Encounter Medications as of 10/01/2020  Medication Sig   Acetaminophen (TYLENOL 8 HOUR ARTHRITIS PAIN PO) Take by mouth.   fish oil-omega-3 fatty acids 1000 MG capsule Take 1 g by mouth at bedtime.    meloxicam (MOBIC) 7.5 MG tablet TAKE 1 TABLET BY MOUTH EVERY DAY   Multiple Vitamins-Minerals (MULTIVITAMIN ADULTS PO) Take 1 tablet by mouth daily.   omeprazole (PRILOSEC) 20 MG capsule Take 1 capsule (20 mg total) by mouth daily as needed.   silodosin (RAPAFLO) 8 MG CAPS capsule TAKE 1 CAPSULE BY MOUTH EVERYDAY AT BEDTIME   No facility-administered encounter medications on file as of 10/01/2020.    Allergies (verified) Lipitor [atorvastatin]   History: Past Medical History:  Diagnosis Date   Arthritis    Blood in stool 02/12/2010   Qualifier: Diagnosis of  By: Julaine Hua CMA (AAMA), Amanda     BPH (benign prostatic hyperplasia) 02/26/9796   Complication of anesthesia    Diverticulosis    Esophageal stricture    GERD 02/12/2010   Qualifier:  Diagnosis of  By: Julaine Hua CMA (AAMA), Estill Bamberg     GERD (gastroesophageal reflux disease)    Hemorrhoids    Hiatal hernia    Idiopathic Charcot's joint of foot 12/27/2012   Mallory - Weiss tear 1981   OAB (overactive bladder) 12/27/2012   Osteoarthritis of right hip 09/19/2014   PONV (postoperative nausea and vomiting)    Pure hypercholesterolemia 12/27/2012   Past Surgical History:  Procedure Laterality Date   CHOLECYSTECTOMY     COLONOSCOPY     GASTROCNEMIUS RECESSION  02/03/2011   Procedure: GASTROCNEMIUS SLIDE;  Surgeon: Colin Rhein, MD;  Location: [facility];  Service: Orthopedics;  Laterality: Right;   HAND SURGERY  jan 2016   rt hand   HEMORRHOID SURGERY  1990   TOTAL HIP ARTHROPLASTY Right 09/19/2014   Procedure: RIGHT TOTAL HIP ARTHROPLASTY ANTERIOR APPROACH;  Surgeon: Rod Can, MD;  Location: [facility];  Service: Orthopedics;  Laterality: Right;   UMBILICAL HERNIA REPAIR     UPPER GASTROINTESTINAL ENDOSCOPY     Family History  Problem Relation Age of Onset   Arthritis Father    Hypertension Father    Colon cancer Maternal Uncle    Diabetes Neg Hx    Heart disease Neg Hx    Stomach cancer Neg Hx    Rectal cancer Neg Hx    Social History   Socioeconomic History   Marital status: Married    Spouse name:  Not on file   Number of children: Not on file   Years of education: Not on file   Highest education level: Not on file  Occupational History   Not on file  Tobacco Use   Smoking status: Former    Types: Cigarettes    Quit date: 01/29/1980    Years since quitting: 40.7   Smokeless tobacco: Never  Substance and Sexual Activity   Alcohol use: Yes    Alcohol/week: 0.0 standard drinks    Comment: 2 beers daily   Drug use: No   Sexual activity: Not on file  Other Topics Concern   Not on file  Social History Narrative   Not on file   Social Determinants of Health   Financial Resource Strain: Low Risk    Difficulty of Paying Living  Expenses: Not hard at all  Food Insecurity: No Food Insecurity   Worried About Charity fundraiser in the Last Year: Never true   Bode in the Last Year: Never true  Transportation Needs: No Transportation Needs   Lack of Transportation (Medical): No   Lack of Transportation (Non-Medical): No  Physical Activity: Sufficiently Active   Days of Exercise per Week: 5 days   Minutes of Exercise per Session: 30 min  Stress: No Stress Concern Present   Feeling of Stress : Not at all  Social Connections: Socially Integrated   Frequency of Communication with Friends and Family: More than three times a week   Frequency of Social Gatherings with Friends and Family: More than three times a week   Attends Religious Services: More than 4 times per year   Active Member of [employer] or Organizations: Yes   Attends Music therapist: More than 4 times per year   Marital Status: Married    Tobacco Counseling Counseling given: Not Answered   Clinical Intake:  Pre-visit preparation completed: Yes  Pain : No/denies pain Pain Score: 0-No pain     BMI - recorded: 25.09 Nutritional Status: BMI 25 -29 Overweight Nutritional Risks: None Diabetes: No  How often do you need to have someone help you when you read instructions, pamphlets, or other written materials from your doctor or pharmacy?: 1 - Never What is the last grade level you completed in school?: High School Graduate  Diabetic? no  Interpreter Needed?: No  Information entered by :: Lisette Abu, LPN   Activities of Daily Living In your present state of health, do you have any difficulty performing the following activities: 10/01/2020  Hearing? N  Vision? N  Difficulty concentrating or making decisions? N  Walking or climbing stairs? N  Dressing or bathing? N  Doing errands, shopping? N  Preparing Food and eating ? N  Using the Toilet? N  In the past six months, have you accidently leaked urine? N  Do you  have problems with loss of bowel control? N  Managing your Medications? N  Managing your Finances? N  Housekeeping or managing your Housekeeping? N  Some recent data might be hidden    Patient Care Team: Biagio Borg, MD as PCP - General (Internal Medicine) Jola Schmidt, MD as Consulting Physician (Ophthalmology)  Indicate any recent Medical Services you may have received from other than [facility] providers in the past year (date may be approximate).     Assessment:   This is a routine wellness examination for [facility]  Hearing/Vision screen Hearing Screening - Comments:: Patient denied any hearing difficulty.   No hearing  aids.  Vision Screening - Comments:: Patient wears readers for fine print.  Eye exam done annually by: Jola Schmidt, MD.  Dietary issues and exercise activities discussed: Current Exercise Habits: Home exercise routine, Type of exercise: walking;Other - see comments (yard work), Time (Minutes): 30, Frequency (Times/Week): 5, Weekly Exercise (Minutes/Week): 150, Intensity: Moderate, Exercise limited by: orthopedic condition(s)   Goals Addressed             This Visit's Progress    Patient Stated       My goal is to stay healthy, continue to eat healthier and stay hydrated.      Depression Screen PHQ 2/9 Scores 10/01/2020 07/18/2020 01/18/2020 07/18/2019 07/18/2019 07/17/2018 04/20/2017  PHQ - 2 Score 0 0 0 0 0 0 0    Fall Risk Fall Risk  10/01/2020 07/18/2020 07/18/2020 01/18/2020 07/18/2019  Falls in the past year? 0 0 0 0 0  Number falls in past yr: 0 0 0 - -  Injury with Fall? 0 0 0 - -  Risk for fall due to : No Fall Risks - - - -  Follow up - - - - -    [place]:  Any stairs in or around the home? Yes  If so, are there any without handrails? No  Home free of loose throw rugs in walkways, pet beds, electrical cords, etc? Yes  Adequate lighting in your home to reduce risk of falls? Yes   ASSISTIVE DEVICES UTILIZED TO  PREVENT FALLS:  Life alert? No  Use of a cane, walker or w/c? No  Grab bars in the bathroom? Yes  Shower chair or bench in shower? Yes  Elevated toilet seat or a handicapped toilet? Yes   TIMED UP AND GO:  Was the test performed? Yes .  Length of time to ambulate 10 feet: 8 sec.   Gait steady and fast without use of assistive device  Cognitive Function: Normal cognitive status assessed by direct observation by this Nurse Health Advisor. No abnormalities found.          Immunizations Immunization History  Administered Date(s) Administered   Fluad Quad(high Dose 65+) 10/04/2018, 10/02/2019   Influenza, High Dose Seasonal PF 10/21/2015, 10/11/2016, 10/21/2017   Influenza,inj,Quad PF,6+ Mos 10/13/2012, 10/29/2013, 11/20/2014   PFIZER(Purple Top)SARS-COV-2 Vaccination 02/10/2019, 03/07/2019, 10/20/2019   Pneumococcal Conjugate-13 12/26/2013   Pneumococcal Polysaccharide-23 12/03/2004, 04/20/2016   Tdap 03/27/2014   Zoster, Live 07/28/2010    TDAP status: Up to date  Flu Vaccine status: Completed at today's visit  Pneumococcal vaccine status: Up to date  Covid-19 vaccine status: Completed vaccines  Qualifies for Shingles Vaccine? Yes   Zostavax completed Yes   Shingrix Completed?: No.    Education has been provided regarding the importance of this vaccine. Patient has been advised to call insurance company to determine out of pocket expense if they have not yet received this vaccine. Advised may also receive vaccine at local pharmacy or Health Dept. Verbalized acceptance and understanding.  Screening Tests Health Maintenance  Topic Date Due   COVID-19 Vaccine (4 - Booster for Pfizer series) 02/20/2020   INFLUENZA VACCINE  08/04/2020   Zoster Vaccines- Shingrix (1 of 2) 10/18/2020 (Originally 12/24/1986)   TETANUS/TDAP  03/26/2024   HPV VACCINES  Aged Out    Health Maintenance  Health Maintenance Due  Topic Date Due   COVID-19 Vaccine (4 - Booster for Pfizer  series) 02/20/2020   INFLUENZA VACCINE  08/04/2020    Colorectal  cancer screening: No longer required.   Lung Cancer Screening: (Low Dose CT Chest recommended if Age 50-80 years, 30 pack-year currently smoking OR have quit w/in 15years.) does not qualify.   Lung Cancer Screening Referral: no  Additional Screening:  Hepatitis C Screening: does not qualify; Completed: no  Vision Screening: Recommended annual ophthalmology exams for early detection of glaucoma and other disorders of the eye. Is the patient up to date with their annual eye exam?  Yes  Who is the provider or what is the name of the office in which the patient attends annual eye exams? Jola Schmidt, MD. If pt is not established with a provider, would they like to be referred to a provider to establish care? No .   Dental Screening: Recommended annual dental exams for proper oral hygiene  Community Resource Referral / Chronic Care Management: CRR required this visit?  No   CCM required this visit?  No      Plan:     I have personally reviewed and noted the following in the patient's chart:   Medical and social history Use of alcohol, tobacco or illicit drugs  Current medications and supplements including opioid prescriptions. Patient is not currently taking opioid prescriptions. Functional ability and status Nutritional status Physical activity Advanced directives List of other physicians Hospitalizations, surgeries, and ER visits in previous 12 months Vitals Screenings to include cognitive, depression, and falls Referrals and appointments  In addition, I have reviewed and discussed with patient certain preventive protocols, quality metrics, and best practice recommendations. A written personalized care plan for preventive services as well as general preventive health recommendations were provided to patient.     Sheral Flow, LPN   3/64/6803   Nurse Notes:  Hearing Screening - Comments:: Patient  denied any hearing difficulty.   No hearing aids.  Vision Screening - Comments:: Patient wears readers for fine print.  Eye exam done annually by: Jola Schmidt, MD.      Medical screening examination/treatment/procedure(s) were performed by non-physician practitioner and as supervising physician I was immediately available for consultation/collaboration.  I agree with above. Cathlean Cower, MD

## 2020-10-17 ENCOUNTER — Telehealth: Payer: Self-pay | Admitting: Internal Medicine

## 2020-10-17 NOTE — Telephone Encounter (Signed)
Type of form received- Handicap Placard  Form placed in -Provider mailbox  Additional instructions from the patient- notify by phone when complete   Things to remember: [place] office: If form received in person, remind patient that forms take 7-10 business days CMA should attach charge sheet and put on Supervisor's desk

## 2020-10-20 NOTE — Telephone Encounter (Signed)
Form completed and patient notified. Will pick up today

## 2020-10-23 ENCOUNTER — Ambulatory Visit: Payer: Medicare Other | Attending: Internal Medicine

## 2020-10-23 ENCOUNTER — Other Ambulatory Visit (HOSPITAL_BASED_OUTPATIENT_CLINIC_OR_DEPARTMENT_OTHER): Payer: Self-pay

## 2020-10-23 DIAGNOSIS — Z23 Encounter for immunization: Secondary | ICD-10-CM

## 2020-10-23 MED ORDER — PFIZER COVID-19 VAC BIVALENT 30 MCG/0.3ML IM SUSP
INTRAMUSCULAR | 0 refills | Status: DC
  Filled 2020-10-23: qty 0.3, 1d supply, fill #0

## 2020-10-23 NOTE — Progress Notes (Signed)
   Covid-19 Vaccination Clinic  Name:  Phillip Shelton    MRN: [id number] DOB: 31-Jan-1934  10/23/2020  [name] was observed post Covid-19 immunization for 15 minutes without incident. He was provided with Vaccine Information Sheet and instruction to access the V-Safe system.   [name] was instructed to call 911 with any severe reactions post vaccine: Difficulty breathing  Swelling of face and throat  A fast heartbeat  A bad rash all over body  Dizziness and weakness   Immunizations Administered     Name Date Dose VIS Date Route   Pfizer Covid-19 Vaccine Bivalent Booster 10/23/2020  1:18 PM 0.3 mL 09/03/2020 Intramuscular   Manufacturer: [place]   Lot: [id number]   [place]: [phone]

## 2020-11-24 ENCOUNTER — Other Ambulatory Visit: Payer: Self-pay

## 2020-11-24 ENCOUNTER — Ambulatory Visit (INDEPENDENT_AMBULATORY_CARE_PROVIDER_SITE_OTHER): Payer: Medicare Other | Admitting: Orthopedic Surgery

## 2020-11-24 DIAGNOSIS — M79671 Pain in right foot: Secondary | ICD-10-CM

## 2020-12-09 ENCOUNTER — Encounter: Payer: Self-pay | Admitting: Orthopedic Surgery

## 2020-12-09 NOTE — Progress Notes (Signed)
Office Visit Note   Patient: Phillip Shelton           Date of Birth: [birth year]           MRN: [id number] Visit Date: 11/24/2020              Requested by: Biagio Borg, MD [address] [place],  [place] [address] PCP: Biagio Borg, MD  Chief Complaint  Patient presents with   Left Foot - Follow-up   Right Foot - Follow-up      HPI: Patient is an 86 year old gentleman who presents in follow-up for right foot pain he has been using Tylenol and Voltaren gel.  Patient also has had some Neurontin that he is using at night that helps.  Patient states he has had a rash on both feet.  Denies any itching or pain or open areas states that there is just a red rash.  Assessment & Plan: Visit Diagnoses:  1. Pain in right foot     Plan: Recommended will socks to help wick away the moisture.  Recommended athlete's foot powder to help with the fungal rash as well.  Follow-Up Instructions: Return if symptoms worsen or fail to improve.   Ortho Exam  Patient is alert, oriented, no adenopathy, well-dressed, normal affect, normal respiratory effort. Examination patient has a fungal rash between his toes there is no cellulitis no open wounds no drainage.  Patient has been using Vaseline on these areas keeping them too moist.  The Voltaren gel has resolved the pain in his foot.  Imaging: No results found. No images are attached to the encounter.  Labs: Lab Results  Component Value Date   ESRSEDRATE 22 (H) 07/17/2018     Lab Results  Component Value Date   ALBUMIN 4.4 07/18/2020   ALBUMIN 4.2 01/15/2019   ALBUMIN 4.3 07/14/2018    No results found for: MG Lab Results  Component Value Date   VD25OH 42.81 07/18/2020   VD25OH 35 07/18/2019   VD25OH 34.52 07/14/2018    No results found for: PREALBUMIN CBC EXTENDED Latest Ref Rng & Units 07/18/2020 07/18/2019 01/15/2019  WBC 4.0 - 10.5 K/uL 5.8 6.5 6.0  RBC 4.22 - 5.81 Mil/uL 4.37 4.45 4.30  HGB 13.0 - 17.0 g/dL 14.1 14.3  13.6  HCT 39.0 - 52.0 % 41.4 42.0 41.2  PLT 150.0 - 400.0 K/uL 253.0 237 237.0  NEUTROABS 1.4 - 7.7 K/uL 3.3 3,348 -  LYMPHSABS 0.7 - 4.0 K/uL 1.6 1,950 -     There is no height or weight on file to calculate BMI.  Orders:  No orders of the defined types were placed in this encounter.  No orders of the defined types were placed in this encounter.    Procedures: No procedures performed  Clinical Data: No additional findings.  ROS:  All other systems negative, except as noted in the HPI. Review of Systems  Objective: Vital Signs: There were no vitals taken for this visit.  Specialty Comments:  No specialty comments available.  PMFS History: Patient Active Problem List   Diagnosis Date Noted   Gait disorder 07/19/2020   Statin myopathy 07/18/2020   Osteoarthritis of left knee 04/24/2020   BPH (benign prostatic hyperplasia) 01/18/2020   History of total hip arthroplasty 12/10/2019   Encounter for well adult exam with abnormal findings 07/18/2019   Osteoarthritis of right knee 04/19/2017   Osteoarthritis of right hip 09/19/2014   Idiopathic Charcot's joint of foot 12/27/2012  Pure hypercholesterolemia 12/27/2012   OAB (overactive bladder) 12/27/2012   Hemorrhoids 08/26/2010   GERD 02/12/2010   Past Medical History:  Diagnosis Date   Arthritis    Blood in stool 02/12/2010   Qualifier: Diagnosis of  By: Julaine Hua CMA (AAMA), Amanda     BPH (benign prostatic hyperplasia) 9/56/2130   Complication of anesthesia    Diverticulosis    Esophageal stricture    GERD 02/12/2010   Qualifier: Diagnosis of  By: Julaine Hua CMA (AAMA), Estill Bamberg     GERD (gastroesophageal reflux disease)    Hemorrhoids    Hiatal hernia    Idiopathic Charcot's joint of foot 12/27/2012   Mallory - Weiss tear 1981   OAB (overactive bladder) 12/27/2012   Osteoarthritis of right hip 09/19/2014   PONV (postoperative nausea and vomiting)    Pure hypercholesterolemia 12/27/2012    Family History   Problem Relation Age of Onset   Arthritis Father    Hypertension Father    Colon cancer Maternal Uncle    Diabetes Neg Hx    Heart disease Neg Hx    Stomach cancer Neg Hx    Rectal cancer Neg Hx     Past Surgical History:  Procedure Laterality Date   CHOLECYSTECTOMY     COLONOSCOPY     GASTROCNEMIUS RECESSION  02/03/2011   Procedure: GASTROCNEMIUS SLIDE;  Surgeon: Colin Rhein, MD;  Location: [facility];  Service: Orthopedics;  Laterality: Right;   HAND SURGERY  jan 2016   rt hand   HEMORRHOID SURGERY  1990   TOTAL HIP ARTHROPLASTY Right 09/19/2014   Procedure: RIGHT TOTAL HIP ARTHROPLASTY ANTERIOR APPROACH;  Surgeon: Rod Can, MD;  Location: [facility];  Service: Orthopedics;  Laterality: Right;   UMBILICAL HERNIA REPAIR     UPPER GASTROINTESTINAL ENDOSCOPY     Social History   Occupational History   Not on file  Tobacco Use   Smoking status: Former    Types: Cigarettes    Quit date: 01/29/1980    Years since quitting: 40.8   Smokeless tobacco: Never  Substance and Sexual Activity   Alcohol use: Yes    Alcohol/week: 0.0 standard drinks    Comment: 2 beers daily   Drug use: No   Sexual activity: Not on file

## 2021-01-02 DIAGNOSIS — R0981 Nasal congestion: Secondary | ICD-10-CM | POA: Diagnosis not present

## 2021-01-02 DIAGNOSIS — J069 Acute upper respiratory infection, unspecified: Secondary | ICD-10-CM | POA: Diagnosis not present

## 2021-01-02 DIAGNOSIS — R051 Acute cough: Secondary | ICD-10-CM | POA: Diagnosis not present

## 2021-01-09 DIAGNOSIS — D1801 Hemangioma of skin and subcutaneous tissue: Secondary | ICD-10-CM | POA: Diagnosis not present

## 2021-01-09 DIAGNOSIS — L821 Other seborrheic keratosis: Secondary | ICD-10-CM | POA: Diagnosis not present

## 2021-01-09 DIAGNOSIS — Z85828 Personal history of other malignant neoplasm of skin: Secondary | ICD-10-CM | POA: Diagnosis not present

## 2021-01-09 DIAGNOSIS — L570 Actinic keratosis: Secondary | ICD-10-CM | POA: Diagnosis not present

## 2021-01-09 DIAGNOSIS — C44319 Basal cell carcinoma of skin of other parts of face: Secondary | ICD-10-CM | POA: Diagnosis not present

## 2021-01-09 DIAGNOSIS — L300 Nummular dermatitis: Secondary | ICD-10-CM | POA: Diagnosis not present

## 2021-01-09 DIAGNOSIS — D485 Neoplasm of uncertain behavior of skin: Secondary | ICD-10-CM | POA: Diagnosis not present

## 2021-01-09 DIAGNOSIS — L814 Other melanin hyperpigmentation: Secondary | ICD-10-CM | POA: Diagnosis not present

## 2021-01-15 DIAGNOSIS — H6121 Impacted cerumen, right ear: Secondary | ICD-10-CM | POA: Diagnosis not present

## 2021-01-26 DIAGNOSIS — H5203 Hypermetropia, bilateral: Secondary | ICD-10-CM | POA: Diagnosis not present

## 2021-01-26 DIAGNOSIS — H524 Presbyopia: Secondary | ICD-10-CM | POA: Diagnosis not present

## 2021-01-26 DIAGNOSIS — H2513 Age-related nuclear cataract, bilateral: Secondary | ICD-10-CM | POA: Diagnosis not present

## 2021-01-30 DIAGNOSIS — Z20822 Contact with and (suspected) exposure to covid-19: Secondary | ICD-10-CM | POA: Diagnosis not present

## 2021-02-10 DIAGNOSIS — H903 Sensorineural hearing loss, bilateral: Secondary | ICD-10-CM | POA: Diagnosis not present

## 2021-02-10 DIAGNOSIS — Z87891 Personal history of nicotine dependence: Secondary | ICD-10-CM | POA: Diagnosis not present

## 2021-02-12 DIAGNOSIS — C44319 Basal cell carcinoma of skin of other parts of face: Secondary | ICD-10-CM | POA: Diagnosis not present

## 2021-03-05 DIAGNOSIS — M19011 Primary osteoarthritis, right shoulder: Secondary | ICD-10-CM | POA: Diagnosis not present

## 2021-04-08 ENCOUNTER — Other Ambulatory Visit: Payer: Self-pay | Admitting: Internal Medicine

## 2021-05-11 DIAGNOSIS — M1711 Unilateral primary osteoarthritis, right knee: Secondary | ICD-10-CM | POA: Diagnosis not present

## 2021-05-28 ENCOUNTER — Ambulatory Visit: Payer: Medicare Other | Admitting: Orthopedic Surgery

## 2021-05-28 ENCOUNTER — Ambulatory Visit (INDEPENDENT_AMBULATORY_CARE_PROVIDER_SITE_OTHER): Payer: Medicare Other

## 2021-05-28 DIAGNOSIS — M25572 Pain in left ankle and joints of left foot: Secondary | ICD-10-CM

## 2021-05-28 DIAGNOSIS — M14672 Charcot's joint, left ankle and foot: Secondary | ICD-10-CM | POA: Diagnosis not present

## 2021-06-03 ENCOUNTER — Encounter: Payer: Self-pay | Admitting: Orthopedic Surgery

## 2021-06-03 NOTE — Progress Notes (Signed)
Office Visit Note   Patient: Phillip Shelton           Date of Birth: [birth year]           MRN: [id number] Visit Date: 05/28/2021              Requested by: Biagio Borg, MD [address] [place],  [place] [address] PCP: Biagio Borg, MD  Chief Complaint  Patient presents with   Left Foot - Pain   Left Ankle - Pain      HPI: Patient is an 87 year old gentleman who presents complaining of foot and ankle pain on the left.  Patient states he has popping with some tenderness on the dorsum of his foot.  He currently ambulates with a cane.  Assessment & Plan: Visit Diagnoses:  1. Pain in left ankle and joints of left foot   2. Charcot's joint, left ankle and foot     Plan: Patient was provided some samples of compression socks for the venous insufficiency.  Patient has no active Charcot process.  Will follow-up if he develops ulcerations or any complications.  Follow-Up Instructions: Return if symptoms worsen or fail to improve.   Ortho Exam  Patient is alert, oriented, no adenopathy, well-dressed, normal affect, normal respiratory effort. Examination patient has no active Charcot changes.  He does have a rocker-bottom deformity without ulceration.  There is a varus tilt in the tibial talar joint.  He has a good dorsalis pedis pulse there is no redness no cellulitis.  There is venous swelling but no open ulcers.  Imaging: No results found. No images are attached to the encounter.  Labs: Lab Results  Component Value Date   ESRSEDRATE 22 (H) 07/17/2018     Lab Results  Component Value Date   ALBUMIN 4.4 07/18/2020   ALBUMIN 4.2 01/15/2019   ALBUMIN 4.3 07/14/2018    No results found for: MG Lab Results  Component Value Date   VD25OH 42.81 07/18/2020   VD25OH 35 07/18/2019   VD25OH 34.52 07/14/2018    No results found for: PREALBUMIN    Latest Ref Rng & Units 07/18/2020    2:49 PM 07/18/2019    2:22 PM 01/15/2019    8:43 AM  CBC EXTENDED  WBC 4.0 - 10.5  K/uL 5.8   6.5   6.0    RBC 4.22 - 5.81 Mil/uL 4.37   4.45   4.30    Hemoglobin 13.0 - 17.0 g/dL 14.1   14.3   13.6    HCT 39.0 - 52.0 % 41.4   42.0   41.2    Platelets 150.0 - 400.0 K/uL 253.0   237   237.0    NEUT# 1.4 - 7.7 K/uL 3.3   3,348     Lymph# 0.7 - 4.0 K/uL 1.6   1,950        There is no height or weight on file to calculate BMI.  Orders:  Orders Placed This Encounter  Procedures   XR Foot 2 Views Left   XR Ankle 2 Views Left   No orders of the defined types were placed in this encounter.    Procedures: No procedures performed  Clinical Data: No additional findings.  ROS:  All other systems negative, except as noted in the HPI. Review of Systems  Objective: Vital Signs: There were no vitals taken for this visit.  Specialty Comments:  No specialty comments available.  PMFS History: Patient Active Problem List  Diagnosis Date Noted   Gait disorder 07/19/2020   Statin myopathy 07/18/2020   Osteoarthritis of left knee 04/24/2020   BPH (benign prostatic hyperplasia) 01/18/2020   History of total hip arthroplasty 12/10/2019   Encounter for well adult exam with abnormal findings 07/18/2019   Osteoarthritis of right knee 04/19/2017   Osteoarthritis of right hip 09/19/2014   Idiopathic Charcot's joint of foot 12/27/2012   Pure hypercholesterolemia 12/27/2012   OAB (overactive bladder) 12/27/2012   Hemorrhoids 08/26/2010   GERD 02/12/2010   Past Medical History:  Diagnosis Date   Arthritis    Blood in stool 02/12/2010   Qualifier: Diagnosis of  By: Julaine Hua CMA (AAMA), Amanda     BPH (benign prostatic hyperplasia) 2/77/8242   Complication of anesthesia    Diverticulosis    Esophageal stricture    GERD 02/12/2010   Qualifier: Diagnosis of  By: Julaine Hua CMA (AAMA), Estill Bamberg     GERD (gastroesophageal reflux disease)    Hemorrhoids    Hiatal hernia    Idiopathic Charcot's joint of foot 12/27/2012   Mallory - Weiss tear 1981   OAB (overactive bladder)  12/27/2012   Osteoarthritis of right hip 09/19/2014   PONV (postoperative nausea and vomiting)    Pure hypercholesterolemia 12/27/2012    Family History  Problem Relation Age of Onset   Arthritis Father    Hypertension Father    Colon cancer Maternal Uncle    Diabetes Neg Hx    Heart disease Neg Hx    Stomach cancer Neg Hx    Rectal cancer Neg Hx     Past Surgical History:  Procedure Laterality Date   CHOLECYSTECTOMY     COLONOSCOPY     GASTROCNEMIUS RECESSION  02/03/2011   Procedure: GASTROCNEMIUS SLIDE;  Surgeon: Colin Rhein, MD;  Location: [facility];  Service: Orthopedics;  Laterality: Right;   HAND SURGERY  jan 2016   rt hand   HEMORRHOID SURGERY  1990   TOTAL HIP ARTHROPLASTY Right 09/19/2014   Procedure: RIGHT TOTAL HIP ARTHROPLASTY ANTERIOR APPROACH;  Surgeon: Rod Can, MD;  Location: [facility];  Service: Orthopedics;  Laterality: Right;   UMBILICAL HERNIA REPAIR     UPPER GASTROINTESTINAL ENDOSCOPY     Social History   Occupational History   Not on file  Tobacco Use   Smoking status: Former    Types: Cigarettes    Quit date: 01/29/1980    Years since quitting: 41.3   Smokeless tobacco: Never  Substance and Sexual Activity   Alcohol use: Yes    Alcohol/week: 0.0 standard drinks    Comment: 2 beers daily   Drug use: No   Sexual activity: Not on file

## 2021-07-04 ENCOUNTER — Other Ambulatory Visit: Payer: Self-pay | Admitting: Internal Medicine

## 2021-07-15 ENCOUNTER — Other Ambulatory Visit (INDEPENDENT_AMBULATORY_CARE_PROVIDER_SITE_OTHER): Payer: Medicare Other

## 2021-07-15 DIAGNOSIS — D692 Other nonthrombocytopenic purpura: Secondary | ICD-10-CM | POA: Diagnosis not present

## 2021-07-15 DIAGNOSIS — E7800 Pure hypercholesterolemia, unspecified: Secondary | ICD-10-CM

## 2021-07-15 DIAGNOSIS — D044 Carcinoma in situ of skin of scalp and neck: Secondary | ICD-10-CM | POA: Diagnosis not present

## 2021-07-15 DIAGNOSIS — L814 Other melanin hyperpigmentation: Secondary | ICD-10-CM | POA: Diagnosis not present

## 2021-07-15 DIAGNOSIS — L821 Other seborrheic keratosis: Secondary | ICD-10-CM | POA: Diagnosis not present

## 2021-07-15 DIAGNOSIS — L570 Actinic keratosis: Secondary | ICD-10-CM | POA: Diagnosis not present

## 2021-07-15 DIAGNOSIS — Z85828 Personal history of other malignant neoplasm of skin: Secondary | ICD-10-CM | POA: Diagnosis not present

## 2021-07-15 DIAGNOSIS — L298 Other pruritus: Secondary | ICD-10-CM | POA: Diagnosis not present

## 2021-07-15 DIAGNOSIS — E538 Deficiency of other specified B group vitamins: Secondary | ICD-10-CM | POA: Diagnosis not present

## 2021-07-15 DIAGNOSIS — E559 Vitamin D deficiency, unspecified: Secondary | ICD-10-CM | POA: Diagnosis not present

## 2021-07-15 LAB — HEPATIC FUNCTION PANEL
ALT: 10 U/L (ref 0–53)
AST: 16 U/L (ref 0–37)
Albumin: 4.3 g/dL (ref 3.5–5.2)
Alkaline Phosphatase: 78 U/L (ref 39–117)
Bilirubin, Direct: 0.2 mg/dL (ref 0.0–0.3)
Total Bilirubin: 0.7 mg/dL (ref 0.2–1.2)
Total Protein: 7.1 g/dL (ref 6.0–8.3)

## 2021-07-15 LAB — CBC WITH DIFFERENTIAL/PLATELET
Basophils Absolute: 0.1 10*3/uL (ref 0.0–0.1)
Basophils Relative: 1 % (ref 0.0–3.0)
Eosinophils Absolute: 0.2 10*3/uL (ref 0.0–0.7)
Eosinophils Relative: 3.6 % (ref 0.0–5.0)
HCT: 37.9 % — ABNORMAL LOW (ref 39.0–52.0)
Hemoglobin: 12.8 g/dL — ABNORMAL LOW (ref 13.0–17.0)
Lymphocytes Relative: 27.6 % (ref 12.0–46.0)
Lymphs Abs: 1.4 10*3/uL (ref 0.7–4.0)
MCHC: 33.7 g/dL (ref 30.0–36.0)
MCV: 93.3 fl (ref 78.0–100.0)
Monocytes Absolute: 0.8 10*3/uL (ref 0.1–1.0)
Monocytes Relative: 15.7 % — ABNORMAL HIGH (ref 3.0–12.0)
Neutro Abs: 2.7 10*3/uL (ref 1.4–7.7)
Neutrophils Relative %: 52.1 % (ref 43.0–77.0)
Platelets: 232 10*3/uL (ref 150.0–400.0)
RBC: 4.07 Mil/uL — ABNORMAL LOW (ref 4.22–5.81)
RDW: 13.9 % (ref 11.5–15.5)
WBC: 5.1 10*3/uL (ref 4.0–10.5)

## 2021-07-15 LAB — LIPID PANEL
Cholesterol: 172 mg/dL (ref 0–200)
HDL: 61.2 mg/dL (ref >39.00)
LDL Cholesterol: 98 mg/dL (ref 0–99)
NonHDL: 111.25
Total CHOL/HDL Ratio: 3
Triglycerides: 66 mg/dL (ref 0.0–149.0)
VLDL: 13.2 mg/dL (ref 0.0–40.0)

## 2021-07-15 LAB — BASIC METABOLIC PANEL
BUN: 16 mg/dL (ref 6–23)
CO2: 27 mEq/L (ref 19–32)
Calcium: 9.5 mg/dL (ref 8.4–10.5)
Chloride: 102 mEq/L (ref 96–112)
Creatinine, Ser: 0.86 mg/dL (ref 0.40–1.50)
GFR: 79.48 mL/min (ref >60.00)
Glucose, Bld: 89 mg/dL (ref 70–99)
Potassium: 4.4 mEq/L (ref 3.5–5.1)
Sodium: 134 mEq/L — ABNORMAL LOW (ref 135–145)

## 2021-07-15 LAB — URINALYSIS
Bilirubin Urine: NEGATIVE
Hgb urine dipstick: NEGATIVE
Ketones, ur: NEGATIVE
Leukocytes,Ua: NEGATIVE
Nitrite: NEGATIVE
Specific Gravity, Urine: 1.015 (ref 1.000–1.030)
Total Protein, Urine: NEGATIVE
Urine Glucose: NEGATIVE
Urobilinogen, UA: 0.2 (ref 0.0–1.0)
pH: 5.5 (ref 5.0–8.0)

## 2021-07-15 LAB — VITAMIN B12: Vitamin B-12: 311 pg/mL (ref 211–911)

## 2021-07-15 LAB — VITAMIN D 25 HYDROXY (VIT D DEFICIENCY, FRACTURES): VITD: 37.89 ng/mL (ref 30.00–100.00)

## 2021-07-15 LAB — TSH: TSH: 1.39 u[IU]/mL (ref 0.35–5.50)

## 2021-07-20 ENCOUNTER — Ambulatory Visit (INDEPENDENT_AMBULATORY_CARE_PROVIDER_SITE_OTHER): Payer: Medicare Other | Admitting: Internal Medicine

## 2021-07-20 ENCOUNTER — Encounter: Payer: Self-pay | Admitting: Internal Medicine

## 2021-07-20 VITALS — BP 152/80 | HR 62 | Temp 98.1°F | Ht 73.0 in | Wt 187.0 lb

## 2021-07-20 DIAGNOSIS — Z0001 Encounter for general adult medical examination with abnormal findings: Secondary | ICD-10-CM

## 2021-07-20 DIAGNOSIS — E559 Vitamin D deficiency, unspecified: Secondary | ICD-10-CM | POA: Insufficient documentation

## 2021-07-20 DIAGNOSIS — E7800 Pure hypercholesterolemia, unspecified: Secondary | ICD-10-CM

## 2021-07-20 DIAGNOSIS — Z0000 Encounter for general adult medical examination without abnormal findings: Secondary | ICD-10-CM | POA: Diagnosis not present

## 2021-07-20 DIAGNOSIS — D649 Anemia, unspecified: Secondary | ICD-10-CM

## 2021-07-20 DIAGNOSIS — E871 Hypo-osmolality and hyponatremia: Secondary | ICD-10-CM

## 2021-07-20 LAB — IBC PANEL
Iron: 111 ug/dL (ref 42–165)
Saturation Ratios: 30.4 % (ref 20.0–50.0)
TIBC: 365.4 ug/dL (ref 250.0–450.0)
Transferrin: 261 mg/dL (ref 212.0–360.0)

## 2021-07-20 LAB — FERRITIN: Ferritin: 188.1 ng/mL (ref 22.0–322.0)

## 2021-07-20 NOTE — Assessment & Plan Note (Signed)
Last vitamin D Lab Results  Component Value Date   VD25OH 37.89 07/15/2021   Low, to start oral replacement  

## 2021-07-20 NOTE — Patient Instructions (Addendum)
Please take OTC Vitamin D3 at 2000 units per day, indefinitely; and ok to continue the multivitamin  Please have the Shingles shot done at the local pharmacy  Please continue all other medications as before, and refills have been done if requested.  Please have the pharmacy call with any other refills you may need.  Please continue your efforts at being more active, low cholesterol diet, and weight control.  You are otherwise up to date with prevention measures today.  Please keep your appointments with your specialists as you may have planned  Please go to the LAB at the blood drawing area for the tests to be done - just the iron levels today  You will be contacted by phone if any changes need to be made immediately.  Otherwise, you will receive a letter about your results with an explanation, but please check with MyChart first.  Please remember to sign up for MyChart if you have not done so, as this will be important to you in the future with finding out test results, communicating by private email, and scheduling acute appointments online when needed.  Please make an Appointment to return in 6 months, or sooner if needed

## 2021-07-20 NOTE — Progress Notes (Unsigned)
Patient ID: Phillip Shelton, male   DOB: 1934-06-20, 87 y.o.   MRN: [id number]         Chief Complaint:: wellness exam and low vit d, anemia, low sodium       HPI:  Phillip Shelton is a 87 y.o. male here for wellness exam; decliens covid booster, and shingrix, o/w up to date                        Also Pt denies chest pain, increased sob or doe, wheezing, orthopnea, PND, increased LE swelling, palpitations, dizziness or syncope.   Pt denies polydipsia, polyuria, or new focal neuro s/s.    Pt denies fever, wt loss, night sweats, loss of appetite, or other constitutional symptoms  Not takin gVit D.  No overt bleeding.  Need right shoulder replacement but cannot stand with arthritic knees, has to use arms each time to stand up, lives alone, so cannot have shoulder surgury.  Has lower stamina recently, hard to pick up even gallon of mild.   Wt Readings from Last 3 Encounters:  07/20/21 187 lb (84.8 kg)  10/01/20 190 lb 3.2 oz (86.3 kg)  07/18/20 190 lb (86.2 kg)   BP Readings from Last 3 Encounters:  07/20/21 (!) 152/80  10/01/20 126/70  07/18/20 132/70   Immunization History  Administered Date(s) Administered   Fluad Quad(high Dose 65+) 10/04/2018, 10/02/2019, 10/01/2020   Influenza, High Dose Seasonal PF 10/21/2015, 10/11/2016, 10/21/2017   Influenza,inj,Quad PF,6+ Mos 10/13/2012, 10/29/2013, 11/20/2014   Influenza,inj,quad, With Preservative 10/05/2018   Influenza-Unspecified 11/04/2020   PFIZER(Purple Top)SARS-COV-2 Vaccination 02/10/2019, 03/07/2019, 10/20/2019   Pfizer Covid-19 Vaccine Bivalent Booster 80yr & up 10/23/2020   Pneumococcal Conjugate-13 12/26/2013   Pneumococcal Polysaccharide-23 12/03/2004, 04/20/2016   Tdap 03/27/2014   Zoster, Live 07/28/2010   There are no preventive care reminders to display for this patient.     Past Medical History:  Diagnosis Date   Arthritis    Blood in stool 02/12/2010   Qualifier: Diagnosis of  By: LJulaine HuaCMA (AAMA), Amanda      BPH (benign prostatic hyperplasia) 18/12/7515  Complication of anesthesia    Diverticulosis    Esophageal stricture    GERD 02/12/2010   Qualifier: Diagnosis of  By: LJulaine HuaCMA (AAMA), AEstill Bamberg    GERD (gastroesophageal reflux disease)    Hemorrhoids    Hiatal hernia    Idiopathic Charcot's joint of foot 12/27/2012   Mallory - Weiss tear 1981   OAB (overactive bladder) 12/27/2012   Osteoarthritis of right hip 09/19/2014   PONV (postoperative nausea and vomiting)    Pure hypercholesterolemia 12/27/2012   Past Surgical History:  Procedure Laterality Date   CHOLECYSTECTOMY     COLONOSCOPY     GASTROCNEMIUS RECESSION  02/03/2011   Procedure: GASTROCNEMIUS SLIDE;  Surgeon: PColin Rhein MD;  Location: [facility]  Service: Orthopedics;  Laterality: Right;   HAND SURGERY  jan 2016   rt hand   HEMORRHOID SURGERY  1990   TOTAL HIP ARTHROPLASTY Right 09/19/2014   Procedure: RIGHT TOTAL HIP ARTHROPLASTY ANTERIOR APPROACH;  Surgeon: BRod Can MD;  Location: [facility];  Service: Orthopedics;  Laterality: Right;   UMBILICAL HERNIA REPAIR     UPPER GASTROINTESTINAL ENDOSCOPY      reports that he quit smoking about 41 years ago. His smoking use included cigarettes. He has never used smokeless tobacco. He reports current alcohol use. He reports that he does not  use drugs. family history includes Arthritis in his father; Colon cancer in his maternal uncle; Hypertension in his father. Allergies  Allergen Reactions   Lipitor [Atorvastatin] Other (See Comments)    Just feels bad and cant sleep   Current Outpatient Medications on File Prior to Visit  Medication Sig Dispense Refill   Acetaminophen (TYLENOL 8 HOUR ARTHRITIS PAIN PO) Take by mouth.     fish oil-omega-3 fatty acids 1000 MG capsule Take 1 g by mouth at bedtime.      meloxicam (MOBIC) 7.5 MG tablet TAKE 1 TABLET BY MOUTH EVERY DAY 30 tablet 0   Multiple Vitamins-Minerals (MULTIVITAMIN ADULTS PO) Take 1 tablet by mouth  daily.     omeprazole (PRILOSEC) 20 MG capsule Take 1 capsule (20 mg total) by mouth daily as needed. 90 capsule 3   silodosin (RAPAFLO) 8 MG CAPS capsule TAKE 1 CAPSULE BY MOUTH EVERYDAY AT BEDTIME 90 capsule 3   No current facility-administered medications on file prior to visit.        ROS:  All others reviewed and negative.  Objective        PE:  BP (!) 152/80 (BP Location: Left Arm, Patient Position: Sitting, Cuff Size: Normal)   Pulse 62   Temp 98.1 F (36.7 C) (Oral)   Ht '6\' 1"'$  (1.854 m)   Wt 187 lb (84.8 kg)   SpO2 98%   BMI 24.67 kg/m                 Constitutional: Pt appears in NAD               HENT: Head: NCAT.                Right Ear: External ear normal.                 Left Ear: External ear normal.                Eyes: . Pupils are equal, round, and reactive to light. Conjunctivae and EOM are normal               Nose: without d/c or deformity               Neck: Neck supple. Gross normal ROM               Cardiovascular: Normal rate and regular rhythm.                 Pulmonary/Chest: Effort normal and breath sounds without rales or wheezing.                Abd:  Soft, NT, ND, + BS, no organomegaly               Neurological: Pt is alert. At baseline orientation, motor grossly intact               Skin: Skin is warm. No rashes, no other new lesions, LE edema - none               Psychiatric: Pt behavior is normal without agitation   Micro: none  Cardiac tracings I have personally interpreted today:  none  Pertinent Radiological findings (summarize): none   Lab Results  Component Value Date   WBC 5.1 07/15/2021   HGB 12.8 (L) 07/15/2021   HCT 37.9 (L) 07/15/2021   PLT 232.0 07/15/2021   GLUCOSE 89 07/15/2021   CHOL 172 07/15/2021   TRIG 66.0 07/15/2021  HDL 61.20 07/15/2021   LDLDIRECT 133.0 12/22/2012   LDLCALC 98 07/15/2021   ALT 10 07/15/2021   AST 16 07/15/2021   NA 134 (L) 07/15/2021   K 4.4 07/15/2021   CL 102 07/15/2021   CREATININE  0.86 07/15/2021   BUN 16 07/15/2021   CO2 27 07/15/2021   TSH 1.39 07/15/2021   INR 1.04 09/12/2014   MICROALBUR 3.3 (H) 12/26/2013   Assessment/Plan:  Phillip Shelton is a 87 y.o. White or Caucasian [1] male with  has a past medical history of Arthritis, Blood in stool (02/12/2010), BPH (benign prostatic hyperplasia) (7/68/0881), Complication of anesthesia, Diverticulosis, Esophageal stricture, GERD (02/12/2010), GERD (gastroesophageal reflux disease), Hemorrhoids, Hiatal hernia, Idiopathic Charcot's joint of foot (12/27/2012), Mallory - Weiss tear (1981), OAB (overactive bladder) (12/27/2012), Osteoarthritis of right hip (09/19/2014), PONV (postoperative nausea and vomiting), and Pure hypercholesterolemia (12/27/2012).  Vitamin D deficiency Last vitamin D Lab Results  Component Value Date   VD25OH 37.89 07/15/2021   Low, to start oral replacement  Encounter for well adult exam with abnormal findings Age and sex appropriate education and counseling updated with regular exercise and diet Referrals for preventative services - none needed Immunizations addressed - declines covid booster, shingrix Smoking counseling  - none needed Evidence for depression or other mood disorder - none significant Most recent labs reviewed. I have personally reviewed and have noted: 1) the patient's medical and social history 2) The patient's current medications and supplements 3) The patient's height, weight, and BMI have been recorded in the chart   Anemia Mild, asympt, no recent bleeding, for iron level today with lab  Hyponatremia Mild chronic persistent low, d/w pt - asympt, ok to continue to follow  Pure hypercholesterolemia Lab Results  Component Value Date   [place] 98 07/15/2021   Stable, pt to continue current low chol diet, declines statin for now   Followup: Return in about 6 months (around 01/20/2022).  Cathlean Cower, MD 07/21/2021 8:57 PM [facility]

## 2021-07-21 ENCOUNTER — Encounter: Payer: Self-pay | Admitting: Internal Medicine

## 2021-07-21 DIAGNOSIS — E871 Hypo-osmolality and hyponatremia: Secondary | ICD-10-CM | POA: Insufficient documentation

## 2021-07-21 DIAGNOSIS — D649 Anemia, unspecified: Secondary | ICD-10-CM | POA: Insufficient documentation

## 2021-07-21 NOTE — Assessment & Plan Note (Signed)

## 2021-07-21 NOTE — Assessment & Plan Note (Signed)
Mild, asympt, no recent bleeding, for iron level today with lab

## 2021-07-21 NOTE — Assessment & Plan Note (Signed)
Mild chronic persistent low, d/w pt - asympt, ok to continue to follow

## 2021-07-21 NOTE — Assessment & Plan Note (Signed)
Lab Results  Component Value Date   LDLCALC 98 07/15/2021   Stable, pt to continue current low chol diet, declines statin for now

## 2021-07-24 DIAGNOSIS — M19011 Primary osteoarthritis, right shoulder: Secondary | ICD-10-CM | POA: Diagnosis not present

## 2021-07-24 DIAGNOSIS — M25511 Pain in right shoulder: Secondary | ICD-10-CM | POA: Diagnosis not present

## 2021-08-02 ENCOUNTER — Other Ambulatory Visit: Payer: Self-pay | Admitting: Internal Medicine

## 2021-08-30 DIAGNOSIS — S91202A Unspecified open wound of left great toe with damage to nail, initial encounter: Secondary | ICD-10-CM | POA: Diagnosis not present

## 2021-09-15 ENCOUNTER — Other Ambulatory Visit: Payer: Self-pay | Admitting: Internal Medicine

## 2021-09-15 ENCOUNTER — Ambulatory Visit (INDEPENDENT_AMBULATORY_CARE_PROVIDER_SITE_OTHER): Payer: Medicare Other | Admitting: Internal Medicine

## 2021-09-15 VITALS — BP 138/76 | HR 66 | Temp 97.8°F | Ht 73.0 in | Wt 185.0 lb

## 2021-09-15 DIAGNOSIS — Z23 Encounter for immunization: Secondary | ICD-10-CM | POA: Diagnosis not present

## 2021-09-15 DIAGNOSIS — R2689 Other abnormalities of gait and mobility: Secondary | ICD-10-CM | POA: Diagnosis not present

## 2021-09-15 DIAGNOSIS — F5101 Primary insomnia: Secondary | ICD-10-CM | POA: Diagnosis not present

## 2021-09-15 DIAGNOSIS — R269 Unspecified abnormalities of gait and mobility: Secondary | ICD-10-CM

## 2021-09-15 DIAGNOSIS — N401 Enlarged prostate with lower urinary tract symptoms: Secondary | ICD-10-CM

## 2021-09-15 DIAGNOSIS — E559 Vitamin D deficiency, unspecified: Secondary | ICD-10-CM | POA: Diagnosis not present

## 2021-09-15 DIAGNOSIS — R351 Nocturia: Secondary | ICD-10-CM

## 2021-09-15 MED ORDER — TEMAZEPAM 15 MG PO CAPS: 15.0000 mg | ORAL_CAPSULE | Freq: Every evening | ORAL | 2 refills | Status: DC | PRN

## 2021-09-15 NOTE — Progress Notes (Signed)
Patient ID: Phillip Shelton, male   DOB: 08/23/1934, 87 y.o.   MRN: [id number]        Chief Complaint: follow up insomnia, nocturia, gait worsening       HPI:  Phillip Shelton is a 87 y.o. male here with 2 wks worsening severe insomnia with unable to get to sleep, states only getting 2-3 hours sleep per night, Denies worsening depressive symptoms, suicidal ideation, or panic.  Has tried melatonin that did seem helpful but caused nausea and dizziness.  Denies urinary symptoms such as dysuria, frequency, urgency, flank pain, hematuria or n/v, fever, chills, but does have worsening nocturia 4-5 times per night for several months as well.  Has not seen urology in several years.  Also has no falls, but has had significant worsening general weakness and increased balance difficulty, afraid of falling at some point.  Pt denies chest pain, increased sob or doe, wheezing, orthopnea, PND, increased LE swelling, palpitations, dizziness or syncope.   Pt denies polydipsia, polyuria, or new focal neuro s/s.          Wt Readings from Last 3 Encounters:  09/15/21 185 lb (83.9 kg)  07/20/21 187 lb (84.8 kg)  10/01/20 190 lb 3.2 oz (86.3 kg)   BP Readings from Last 3 Encounters:  09/15/21 138/76  07/20/21 (!) 152/80  10/01/20 126/70         Past Medical History:  Diagnosis Date   Arthritis    Blood in stool 02/12/2010   Qualifier: Diagnosis of  By: Julaine Hua CMA (AAMA), Amanda     BPH (benign prostatic hyperplasia) 9/83/3825   Complication of anesthesia    Diverticulosis    Esophageal stricture    GERD 02/12/2010   Qualifier: Diagnosis of  By: Julaine Hua CMA (AAMA), Estill Bamberg     GERD (gastroesophageal reflux disease)    Hemorrhoids    Hiatal hernia    Idiopathic Charcot's joint of foot 12/27/2012   Mallory - Weiss tear 1981   OAB (overactive bladder) 12/27/2012   Osteoarthritis of right hip 09/19/2014   PONV (postoperative nausea and vomiting)    Pure hypercholesterolemia 12/27/2012   Past Surgical  History:  Procedure Laterality Date   CHOLECYSTECTOMY     COLONOSCOPY     GASTROCNEMIUS RECESSION  02/03/2011   Procedure: GASTROCNEMIUS SLIDE;  Surgeon: Colin Rhein, MD;  Location: [facility];  Service: Orthopedics;  Laterality: Right;   HAND SURGERY  jan 2016   rt hand   HEMORRHOID SURGERY  1990   TOTAL HIP ARTHROPLASTY Right 09/19/2014   Procedure: RIGHT TOTAL HIP ARTHROPLASTY ANTERIOR APPROACH;  Surgeon: Rod Can, MD;  Location: [facility];  Service: Orthopedics;  Laterality: Right;   UMBILICAL HERNIA REPAIR     UPPER GASTROINTESTINAL ENDOSCOPY      reports that he quit smoking about 41 years ago. His smoking use included cigarettes. He has never used smokeless tobacco. He reports current alcohol use. He reports that he does not use drugs. family history includes Arthritis in his father; Colon cancer in his maternal uncle; Hypertension in his father. Allergies  Allergen Reactions   Lipitor [Atorvastatin] Other (See Comments)    Just feels bad and cant sleep   Current Outpatient Medications on File Prior to Visit  Medication Sig Dispense Refill   Acetaminophen (TYLENOL 8 HOUR ARTHRITIS PAIN PO) Take by mouth.     fish oil-omega-3 fatty acids 1000 MG capsule Take 1 g by mouth at bedtime.      meloxicam (  MOBIC) 7.5 MG tablet TAKE 1 TABLET BY MOUTH EVERY DAY 90 tablet 1   Multiple Vitamins-Minerals (MULTIVITAMIN ADULTS PO) Take 1 tablet by mouth daily.     omeprazole (PRILOSEC) 20 MG capsule Take 1 capsule (20 mg total) by mouth daily as needed. 90 capsule 3   No current facility-administered medications on file prior to visit.        ROS:  All others reviewed and negative.  Objective        PE:  BP 138/76 (BP Location: Left Arm, Patient Position: Sitting, Cuff Size: Large)   Pulse 66   Temp 97.8 F (36.6 C) (Oral)   Ht '6\' 1"'$  (1.854 m)   Wt 185 lb (83.9 kg)   SpO2 98%   BMI 24.41 kg/m                 Constitutional: Pt appears in NAD               HENT:  Head: NCAT.                Right Ear: External ear normal.                 Left Ear: External ear normal.                Eyes: . Pupils are equal, round, and reactive to light. Conjunctivae and EOM are normal               Nose: without d/c or deformity               Neck: Neck supple. Gross normal ROM               Cardiovascular: Normal rate and regular rhythm.                 Pulmonary/Chest: Effort normal and breath sounds without rales or wheezing.                Abd:  Soft, NT, ND, + BS, no organomegaly               Neurological: Pt is alert. At baseline orientation, motor grossly intact               Skin: Skin is warm. No rashes, no other new lesions, LE edema - none               Psychiatric: Pt behavior is normal without agitation   Micro: none  Cardiac tracings I have personally interpreted today:  none  Pertinent Radiological findings (summarize): none   Lab Results  Component Value Date   WBC 5.1 07/15/2021   HGB 12.8 (L) 07/15/2021   HCT 37.9 (L) 07/15/2021   PLT 232.0 07/15/2021   GLUCOSE 89 07/15/2021   CHOL 172 07/15/2021   TRIG 66.0 07/15/2021   HDL 61.20 07/15/2021   LDLDIRECT 133.0 12/22/2012   LDLCALC 98 07/15/2021   ALT 10 07/15/2021   AST 16 07/15/2021   NA 134 (L) 07/15/2021   K 4.4 07/15/2021   CL 102 07/15/2021   CREATININE 0.86 07/15/2021   BUN 16 07/15/2021   CO2 27 07/15/2021   TSH 1.39 07/15/2021   INR 1.04 09/12/2014   MICROALBUR 3.3 (H) 12/26/2013   Assessment/Plan:  Phillip Shelton is a 87 y.o. White or Caucasian [1] male with  has a past medical history of Arthritis, Blood in stool (02/12/2010), BPH (benign prostatic hyperplasia) (5/95/6387), Complication of anesthesia, Diverticulosis,  Esophageal stricture, GERD (02/12/2010), GERD (gastroesophageal reflux disease), Hemorrhoids, Hiatal hernia, Idiopathic Charcot's joint of foot (12/27/2012), Mallory - Weiss tear (1981), OAB (overactive bladder) (12/27/2012), Osteoarthritis of right hip  (09/19/2014), PONV (postoperative nausea and vomiting), and Pure hypercholesterolemia (12/27/2012).  BPH (benign prostatic hyperplasia) With recent symptoms worsening and nocturia - for urology referral  Gait disorder With recent worsening general weakness, no falls recent, but for PT referral  Vitamin D deficiency Last vitamin D Lab Results  Component Value Date   VD25OH 37.89 07/15/2021   Low, to start oral replacement   Insomnia Uncontrolled, miserable, denies need for depression tx or counseling, now for temazepam 15 mg qhs prn  Followup: Return in about 3 months (around 12/15/2021).  Cathlean Cower, MD 09/17/2021 10:00 PM [facility]

## 2021-09-15 NOTE — Patient Instructions (Signed)
You had the flu shot today  Please take all new medication as prescribed  - the sleeping medication only as needed  Please continue all other medications as before, and refills have been done if requested.  Please have the pharmacy call with any other refills you may need.  Please continue your efforts at being more active, low cholesterol diet, and weight control.  Please keep your appointments with your specialists as you may have planned  You will be contacted regarding the referral for: Urology - Dr Vikki Ports, and Physical Therapy  Please make an Appointment to return in 3 months, or sooner if needed

## 2021-09-17 ENCOUNTER — Encounter: Payer: Self-pay | Admitting: Internal Medicine

## 2021-09-17 DIAGNOSIS — G4700 Insomnia, unspecified: Secondary | ICD-10-CM | POA: Insufficient documentation

## 2021-09-17 NOTE — Assessment & Plan Note (Signed)
With recent worsening general weakness, no falls recent, but for PT referral

## 2021-09-17 NOTE — Assessment & Plan Note (Signed)
Last vitamin D Lab Results  Component Value Date   VD25OH 37.89 07/15/2021   Low, to start oral replacement  

## 2021-09-17 NOTE — Assessment & Plan Note (Signed)
Uncontrolled, miserable, denies need for depression tx or counseling, now for temazepam 15 mg qhs prn

## 2021-09-17 NOTE — Assessment & Plan Note (Signed)
With recent symptoms worsening and nocturia - for urology referral

## 2021-09-22 ENCOUNTER — Ambulatory Visit: Payer: Medicare Other | Admitting: Physical Therapy

## 2021-09-22 ENCOUNTER — Other Ambulatory Visit: Payer: Self-pay

## 2021-09-22 ENCOUNTER — Encounter: Payer: Self-pay | Admitting: Physical Therapy

## 2021-09-22 DIAGNOSIS — M6281 Muscle weakness (generalized): Secondary | ICD-10-CM | POA: Diagnosis not present

## 2021-09-22 DIAGNOSIS — R2689 Other abnormalities of gait and mobility: Secondary | ICD-10-CM

## 2021-09-22 DIAGNOSIS — R293 Abnormal posture: Secondary | ICD-10-CM

## 2021-09-22 DIAGNOSIS — R2681 Unsteadiness on feet: Secondary | ICD-10-CM

## 2021-09-22 NOTE — Therapy (Signed)
OUTPATIENT PHYSICAL THERAPY LOWER EXTREMITY EVALUATION   Patient Name: Phillip Shelton MRN: [id number] DOB:September 19, 1934, 87 y.o., male Today's Date: 09/22/2021   PT End of Session - 09/22/21 1704     Visit Number 1    Number of Visits 8    Date for PT Re-Evaluation 10/16/21    PT Start Time 1301    PT Stop Time 1344    PT Time Calculation (min) 43 min    Activity Tolerance Patient tolerated treatment well    Behavior During Therapy [facility] for tasks assessed/performed             Past Medical History:  Diagnosis Date   Arthritis    Blood in stool 02/12/2010   Qualifier: Diagnosis of  By: Julaine Hua CMA (AAMA), Amanda     BPH (benign prostatic hyperplasia) 8/54/6270   Complication of anesthesia    Diverticulosis    Esophageal stricture    GERD 02/12/2010   Qualifier: Diagnosis of  By: Julaine Hua CMA (AAMA), Estill Bamberg     GERD (gastroesophageal reflux disease)    Hemorrhoids    Hiatal hernia    Idiopathic Charcot's joint of foot 12/27/2012   Mallory - Weiss tear 1981   OAB (overactive bladder) 12/27/2012   Osteoarthritis of right hip 09/19/2014   PONV (postoperative nausea and vomiting)    Pure hypercholesterolemia 12/27/2012   Past Surgical History:  Procedure Laterality Date   CHOLECYSTECTOMY     COLONOSCOPY     GASTROCNEMIUS RECESSION  02/03/2011   Procedure: GASTROCNEMIUS SLIDE;  Surgeon: Colin Rhein, MD;  Location: [facility];  Service: Orthopedics;  Laterality: Right;   HAND SURGERY  jan 2016   rt hand   HEMORRHOID SURGERY  1990   TOTAL HIP ARTHROPLASTY Right 09/19/2014   Procedure: RIGHT TOTAL HIP ARTHROPLASTY ANTERIOR APPROACH;  Surgeon: Rod Can, MD;  Location: [facility];  Service: Orthopedics;  Laterality: Right;   UMBILICAL HERNIA REPAIR     UPPER GASTROINTESTINAL ENDOSCOPY     Patient Active Problem List   Diagnosis Date Noted   Insomnia 09/17/2021   Anemia 07/21/2021   Hyponatremia 07/21/2021   Vitamin D deficiency 07/20/2021    Sensorineural hearing loss (SNHL), bilateral 02/10/2021   Gait disorder 07/19/2020   Statin myopathy 07/18/2020   Osteoarthritis of left knee 04/24/2020   BPH (benign prostatic hyperplasia) 01/18/2020   History of total hip arthroplasty 12/10/2019   Encounter for well adult exam with abnormal findings 07/18/2019   Synovial cyst of left popliteal space 07/24/2018   Osteoarthritis of right knee 04/19/2017   Osteoarthritis of right hip 09/19/2014   Idiopathic Charcot's joint of foot 12/27/2012   Pure hypercholesterolemia 12/27/2012   OAB (overactive bladder) 12/27/2012   Hemorrhoids 08/26/2010   GERD 02/12/2010    PCP: Biagio Borg, MD  REFERRING PROVIDER: Biagio Borg, MD  REFERRING DIAG: R26.89 (ICD-10-CM) - Balance disorder R26.9 (ICD-10-CM) - Gait disorder   THERAPY DIAG:  Unsteadiness on feet  Muscle weakness (generalized)  Other abnormalities of gait and mobility  Abnormal posture  Rationale for Evaluation and Treatment Rehabilitation  ONSET DATE: 09/15/21 referral  SUBJECTIVE:   SUBJECTIVE STATEMENT: He doesn't know why he got a referral here or seeing a need to come in, and doesn't think PT can do anything for him. He reports toes are crooked, both ankles collapsed in, both knees needing replacement with regular fluid draws, back pain that increases when bending over, and Rt shoulder pain that he receives injections for.  He can walk uphill and can't walk downhill. He falls when he closes his eyes. He can't wash his car or use a weed eater but he rides a lawnmower without difficulty. He needs handrails to use stairs. He doesn't use cane in home, and has been using it outside the home for about a year now.  PERTINENT HISTORY: Rt THA 2016, charcot's foot, insomnia  PAIN:  Are you having pain? No  PRECAUTIONS: None  WEIGHT BEARING RESTRICTIONS No  FALLS:  Has patient fallen in last 6 months? Yes. Number of falls 1, falling in bathtub 2-3 weeks ago  He has  concerns about falling and being unable to get up, and wears his life alert occasionally  LIVING ENVIRONMENT: Lives with:  Lives in:  Stairs: Yes: External: 2 steps; can reach both Has following equipment at home: Single point cane  OCCUPATION: retired since approx. 2015   PLOF: Independent with basic ADLs, Independent with household mobility without device, and Independent with community mobility with device  PATIENT GOALS  pt does not report any goals because he does not feel that PT will help him   OBJECTIVE:   PATIENT SURVEYS:  FOTO 09/22/21 45%, goal 53%  COGNITION: Overall cognitive status: Within functional limits for tasks assessed     POSTURE: anterior pelvic tilt, weight shift right, and bilateral knee valgus that is increased on Lt   LOWER EXTREMITY ROM:   ROM Right eval Left eval  Hip flexion    Hip extension    Hip abduction    Hip adduction    Hip internal rotation    Hip external rotation    Knee flexion    Knee extension    Ankle dorsiflexion    Ankle plantarflexion    Ankle inversion    Ankle eversion     (Blank rows = not tested)  LOWER EXTREMITY MMT:  MMT Right Eval 09/22/21 Left Eval 09/22/21  Hip flexion 4/5 3+/5  Hip extension    Hip abduction    Hip adduction    Hip internal rotation    Hip external rotation    Knee flexion 4/5 seated 4/5 seated  Knee extension 4/5 4/5  Ankle dorsiflexion 4/5 4/5  Ankle plantarflexion    Ankle inversion    Ankle eversion     (Blank rows = not tested)  All MMT assessment pain free, pt notes high amounts of crepitus in BLEs with increased in Rt knee   FUNCTIONAL TESTS:  09/22/21 Timed up and go (TUG): 27.68 seconds without AD Berg Balance Scale: not completed but partially scores as indicated  [facility] PT Assessment - 09/22/21 0001       Balance   Balance Assessed Yes      Standardized Balance Assessment   Standardized Balance Assessment Berg Balance Test      Berg Balance Test   Sit to  Stand Needs minimal aid to stand or to stabilize    Standing Unsupported Able to stand 30 seconds unsupported    Stand to Sit Uses backs of legs against chair to control descent    Standing Unsupported with Eyes Closed Needs help to keep from falling    Standing Unsupported with Feet Together Needs help to attain position and unable to hold for 15 seconds    From Standing Position, Turn to Look Behind Over each Shoulder Needs supervision when turning    Turn 360 Degrees Needs close supervision or verbal cueing    Standing Unsupported, One Foot in [place]  Needs help to step but can hold 15 seconds             GAIT: Distance walked: 50' Assistive device utilized: Single point cane Level of assistance: Modified independence Comments: ambulates with bil. reduced step length, bil, reduced knee extension in stance    TODAY'S TREATMENT: Reviewed HEP below, patient performed trial reps for comprehension with verbal and tactile cueing for technique  PATIENT EDUCATION:  Education details: HEP Person educated: Patient Education method: Consulting civil engineer, Media planner, Verbal cues, and Handouts Education comprehension: verbalized understanding, returned demonstration, and verbal cues required   HOME EXERCISE PROGRAM: Access Code: [email or web address] URL: [email or web address] Date: 09/22/2021 Prepared by: Elsie Ra  Exercises - Side Stepping with Counter Support  - 2 x daily - 6 x weekly - 1 sets - 3-5 reps - Standing Tandem Balance with Counter Support  - 2 x daily - 6 x weekly - 1 sets - 2 reps - 20 hold - Sit to Stand with Counter Support  - 2 x daily - 6 x weekly - 1-2 sets - 5 reps - Narrow Stance with Unilateral Counter Support  - 2 x daily - 6 x weekly - 1 sets - 3 reps - Heel Toe Raises with Counter Support  - 2 x daily - 6 x weekly - 2 sets - 10 reps - alternating marching  - 2 x daily - 6 x weekly - 1-2 sets - 10 reps  ASSESSMENT:  CLINICAL IMPRESSION: Patient is a 87  y.o. male who was seen today for physical therapy evaluation and treatment for balance and gait impairment. He presented with bil. LE weakness, reduced static balance, reduced functional mobility with and without AD, and gait impairments limiting functional abilities. HEP was supplied to address balance impairments and LE weakness with instruction for safe set up at home. He is not intending to return to PT following today's visit.   OBJECTIVE IMPAIRMENTS Abnormal gait, decreased activity tolerance, decreased balance, decreased endurance, decreased mobility, difficulty walking, decreased ROM, decreased strength, and postural dysfunction.   ACTIVITY LIMITATIONS bending, sitting, standing, squatting, stairs, and locomotion level  PARTICIPATION LIMITATIONS: community activity and yard work  [place], Time since onset of injury/illness/exacerbation, and 3+ comorbidities: see PMH  are also affecting patient's functional outcome.   REHAB POTENTIAL: Good  CLINICAL DECISION MAKING: Stable/uncomplicated  EVALUATION COMPLEXITY: Low   GOALS: Goals reviewed with patient? Yes  Patient will demonstrate understanding of home exercise program, printed at evaluation. Met 09/22/21  Other PT goals to be written if he returns to PT as this is likely a 1-time visit only as pt relays.  PLAN: PT FREQUENCY: 1-2x/week  PT DURATION: 4 weeks  PLANNED INTERVENTIONS: Therapeutic exercises, Therapeutic activity, Neuromuscular re-education, Balance training, Gait training, Patient/Family education, Self Care, Stair training, DME instructions, Aquatic Therapy, Electrical stimulation, Cryotherapy, Moist heat, Ultrasound, Ionotophoresis 79m/ml Dexamethasone, Manual therapy, Re-evaluation, and physical performance tests  PLAN FOR NEXT SESSION: review HEP and progress as indicated if pt returns   [employer] Student-PT 09/22/2021, 5:06 PM  This entire session of physical therapy was performed  under the direct supervision of PT signing evaluation /treatment. PT reviewed note and agrees.  BElsie Ra PT, DPT 09/22/2021, 5:15 PM

## 2021-10-02 ENCOUNTER — Ambulatory Visit (INDEPENDENT_AMBULATORY_CARE_PROVIDER_SITE_OTHER): Payer: Medicare Other

## 2021-10-02 DIAGNOSIS — Z0000 Encounter for general adult medical examination without abnormal findings: Secondary | ICD-10-CM | POA: Diagnosis not present

## 2021-10-02 NOTE — Progress Notes (Signed)
Subjective:   Phillip Shelton is a 87 y.o. male who presents for Medicare Annual/Subsequent preventive examination.  I connected with Phillip Shelton today by telephone and verified that I am speaking with the correct person using two identifiers. I discussed the limitations, risks, security and privacy concerns of performing an evaluation and management service by telephone and the availability of in person appointments. I also discussed with the patient that there may be a patient responsible charge related to this service. The patient expressed understanding and agreed to proceed. Location patient: home Location provider: Pietro Shelton Persons participating in the visit: Phillip Shelton and Phillip Shelton, [place].  Time Spent with patient on telephone encounter: 10 mins  Review of Systems    No ROS. Medicare Wellness Telephone Visit. Additional risk factors are reflected in social history. Cardiac Risk Factors include: advanced age (>86mn, >>28women);male gender;sedentary lifestyle     Objective:    There were no vitals filed for this visit. There is no height or weight on file to calculate BMI.     10/02/2021   12:43 PM 09/22/2021    1:14 PM 10/01/2020    4:45 PM 01/02/2019   10:53 AM 06/26/2015    7:15 AM 06/12/2015    1:59 PM 09/19/2014    6:20 PM  Advanced Directives  Does Patient Have a Medical Advance Directive? No No No No No No No  Would patient like information on creating a medical advance directive? No - Patient declined No - Patient declined No - Patient declined Yes (ED - Information included in AVS)   No - patient declined information    Current Medications (verified) Outpatient Encounter Medications as of 10/02/2021  Medication Sig   Acetaminophen (TYLENOL 8 HOUR ARTHRITIS PAIN PO) Take by mouth.   fish oil-omega-3 fatty acids 1000 MG capsule Take 1 g by mouth at bedtime.    meloxicam (MOBIC) 7.5 MG tablet TAKE 1 TABLET BY MOUTH EVERY DAY   Multiple Vitamins-Minerals  (MULTIVITAMIN ADULTS PO) Take 1 tablet by mouth daily.   omeprazole (PRILOSEC) 20 MG capsule Take 1 capsule (20 mg total) by mouth daily as needed.   silodosin (RAPAFLO) 8 MG CAPS capsule TAKE 1 CAPSULE BY MOUTH EVERYDAY AT BEDTIME   temazepam (RESTORIL) 15 MG capsule Take 1 capsule (15 mg total) by mouth at bedtime as needed for sleep.   No facility-administered encounter medications on file as of 10/02/2021.    Allergies (verified) Lipitor [atorvastatin]   History: Past Medical History:  Diagnosis Date   Arthritis    Blood in stool 02/12/2010   Qualifier: Diagnosis of  By: LJulaine HuaCMA (AAMA), Amanda     BPH (benign prostatic hyperplasia) 12/35/3614  Complication of anesthesia    Diverticulosis    Esophageal stricture    GERD 02/12/2010   Qualifier: Diagnosis of  By: LJulaine HuaCMA (AAMA), AEstill Bamberg    GERD (gastroesophageal reflux disease)    Hemorrhoids    Hiatal hernia    Idiopathic Charcot's joint of foot 12/27/2012   Mallory - Weiss tear 1981   OAB (overactive bladder) 12/27/2012   Osteoarthritis of right hip 09/19/2014   PONV (postoperative nausea and vomiting)    Pure hypercholesterolemia 12/27/2012   Past Surgical History:  Procedure Laterality Date   CHOLECYSTECTOMY     COLONOSCOPY     GASTROCNEMIUS RECESSION  02/03/2011   Procedure: GASTROCNEMIUS SLIDE;  Surgeon: PColin Rhein MD;  Location: [facility]  Service: Orthopedics;  Laterality: Right;  HAND SURGERY  jan 2016   rt hand   HEMORRHOID SURGERY  1990   TOTAL HIP ARTHROPLASTY Right 09/19/2014   Procedure: RIGHT TOTAL HIP ARTHROPLASTY ANTERIOR APPROACH;  Surgeon: Rod Can, MD;  Location: [facility];  Service: Orthopedics;  Laterality: Right;   UMBILICAL HERNIA REPAIR     UPPER GASTROINTESTINAL ENDOSCOPY     Family History  Problem Relation Age of Onset   Arthritis Father    Hypertension Father    Colon cancer Maternal Uncle    Diabetes Neg Hx    Heart disease Neg Hx    Stomach cancer Neg Hx     Rectal cancer Neg Hx    Social History   Socioeconomic History   Marital status: Married    Spouse name: Not on file   Number of children: Not on file   Years of education: Not on file   Highest education level: Not on file  Occupational History   Not on file  Tobacco Use   Smoking status: Former    Types: Cigarettes    Quit date: 01/29/1980    Years since quitting: 41.7   Smokeless tobacco: Never  Substance and Sexual Activity   Alcohol use: Yes    Alcohol/week: 0.0 standard drinks of alcohol    Comment: 2 beers daily   Drug use: No   Sexual activity: Not on file  Other Topics Concern   Not on file  Social History Narrative   Not on file   Social Determinants of Health   Financial Resource Strain: Low Risk  (10/02/2021)   Overall Financial Resource Strain (CARDIA)    Difficulty of Paying Living Expenses: Not hard at all  Food Insecurity: No Food Insecurity (10/02/2021)   Hunger Vital Sign    Worried About Running Out of Food in the Last Year: Never true    Ran Out of Food in the Last Year: Never true  Transportation Needs: No Transportation Needs (10/02/2021)   PRAPARE - Hydrologist (Medical): No    Lack of Transportation (Non-Medical): No  Physical Activity: Inactive (10/02/2021)   Exercise Vital Sign    Days of Exercise per Week: 0 days    Minutes of Exercise per Session: 0 min  Stress: No Stress Concern Present (10/02/2021)   [place]    Feeling of Stress : Not at all  Social Connections: Moderately Isolated (10/02/2021)   Social Connection and Isolation Panel [NHANES]    Frequency of Communication with Friends and Family: More than three times a week    Frequency of Social Gatherings with Friends and Family: Once a week    Attends Religious Services: Never    Marine scientist or Organizations: No    Attends Music therapist: Never    Marital  Status: Married    Tobacco Counseling Counseling given: Not Answered   Clinical Intake:  Pre-visit preparation completed: Yes  Pain : No/denies pain     Nutritional Risks: None Diabetes: No  How often do you need to have someone help you when you read instructions, pamphlets, or other written materials from your doctor or pharmacy?: 1 - Never What is the last grade level you completed in school?: 12th grade  Diabetic?no  Interpreter Needed?: No  Information entered by :: Phillip Shelton, CMA   Activities of Daily Living    10/02/2021   12:43 PM  In your present state of health, do  you have any difficulty performing the following activities:  Hearing? 0  Vision? 0  Difficulty concentrating or making decisions? 0  Walking or climbing stairs? 0  Dressing or bathing? 0  Doing errands, shopping? 0  Preparing Food and eating ? N  Using the Toilet? N  In the past six months, have you accidently leaked urine? N  Do you have problems with loss of bowel control? N  Managing your Medications? N  Managing your Finances? N  Housekeeping or managing your Housekeeping? N    Patient Care Team: Biagio Borg, MD as PCP - General (Internal Medicine) Jola Schmidt, MD as Consulting Physician (Ophthalmology)  Indicate any recent Medical Services you may have received from other than [facility] providers in the past year (date may be approximate).     Assessment:   This is a routine wellness examination for [facility].  Hearing/Vision screen Patient denied any hearing difficulty. No hearing aids. Patient does not wear any corrective lenses/contacts.  Dietary issues and exercise activities discussed: Current Exercise Habits: The patient does not participate in regular exercise at present, Exercise limited by: None identified   Goals Addressed             This Visit's Progress    patient stated       He would like to continue to stay healthy.        Depression Screen     10/02/2021   12:42 PM 09/15/2021   11:05 AM 07/20/2021    1:25 PM 10/01/2020    4:44 PM 07/18/2020    2:02 PM 01/18/2020    1:43 PM 07/18/2019    1:41 PM  PHQ 2/9 Scores  PHQ - 2 Score 0  0 0 0 0 0  Exception Documentation  Patient refusal         Fall Risk    10/02/2021   12:43 PM 09/15/2021   11:05 AM 07/20/2021    1:25 PM 10/01/2020    4:46 PM 07/18/2020    1:59 PM  Fall Risk   Falls in the past year? 1 0 1 0 0  Number falls in past yr: 1  1 0 0  Injury with Fall? 0 0 0 0 0  Risk for fall due to : History of fall(s) No Fall Risks  No Fall Risks   Follow up Falls evaluation completed Falls evaluation completed       [place]:  Any stairs in or around the home? Yes  If so, are there any without handrails? No  Home free of loose throw rugs in walkways, pet beds, electrical cords, etc? Yes  Adequate lighting in your home to reduce risk of falls? Yes   ASSISTIVE DEVICES UTILIZED TO PREVENT FALLS:  Life alert? Yes  Use of a cane, walker or w/c? Yes  Grab bars in the bathroom? Yes  Shower chair or bench in shower? Yes  Elevated toilet seat or a handicapped toilet? Yes   TIMED UP AND GO:  Was the test performed? No .  Length of time to ambulate 10 feet: N/A sec.   Patient stated that he has no issues with gait or balance; does use cane and walker.   Cognitive Function:  Patient is cogitatively intact.      10/02/2021   12:44 PM  6CIT Screen  What Year? 0 points  What month? 0 points  What time? 0 points  Count back from 20 2 points  Months in  reverse 2 points  Repeat phrase 2 points  Total Score 6 points    Immunizations Immunization History  Administered Date(s) Administered   Fluad Quad(high Dose 65+) 10/04/2018, 10/02/2019, 10/01/2020, 09/15/2021   Influenza, High Dose Seasonal PF 10/21/2015, 10/11/2016, 10/21/2017   Influenza,inj,Quad PF,6+ Mos 10/13/2012, 10/29/2013, 11/20/2014   Influenza,inj,quad, With Preservative  10/05/2018   Influenza-Unspecified 11/04/2020   PFIZER(Purple Top)SARS-COV-2 Vaccination 02/10/2019, 03/07/2019, 10/20/2019   Pfizer Covid-19 Vaccine Bivalent Booster 66yr & up 10/23/2020   Pneumococcal Conjugate-13 12/26/2013   Pneumococcal Polysaccharide-23 12/03/2004, 04/20/2016   Tdap 03/27/2014   Zoster, Live 07/28/2010    TDAP status: Up to date  Flu Vaccine status: Up to date  Pneumococcal vaccine status: Up to date  Covid-19 vaccine status: Completed vaccines  Qualifies for Shingles Vaccine? Yes   Zostavax completed No   Shingrix Completed?: No.    Education has been provided regarding the importance of this vaccine. Patient has been advised to call insurance company to determine out of pocket expense if they have not yet received this vaccine. Advised may also receive vaccine at local pharmacy or Health Dept. Verbalized acceptance and understanding.  Screening Tests Health Maintenance  Topic Date Due   COVID-19 Vaccine (5 - Pfizer series) 10/18/2021 (Originally 02/23/2021)   Zoster Vaccines- Shingrix (1 of 2) 10/20/2021 (Originally 12/24/1986)   TETANUS/TDAP  03/26/2024   Pneumonia Vaccine 85 Years old  Completed   INFLUENZA VACCINE  Completed   HPV VACCINES  Aged Out    Health Maintenance  There are no preventive care reminders to display for this patient.  Colorectal cancer screening: No longer required.   Lung Cancer Screening: (Low Dose CT Chest recommended if Age 85-80years, 30 pack-year currently smoking OR have quit w/in 15years.) does not qualify.   Lung Cancer Screening Referral: N/A  Additional Screening:  Hepatitis C Screening: does not qualify; Completed N/A  Vision Screening: Recommended annual ophthalmology exams for early detection of glaucoma and other disorders of the eye. Is the patient up to date with their annual eye exam?  Yes  Who is the provider or what is the name of the office in which the patient attends annual eye exams? Dr.  BValetta CloseIf pt is not established with a provider, would they like to be referred to a provider to establish care? No .   Dental Screening: Recommended annual dental exams for proper oral hygiene  Community Resource Referral / Chronic Care Management: CRR required this visit?  No   CCM required this visit?  No      Plan:     I have personally reviewed and noted the following in the patient's chart:   Medical and social history Use of alcohol, tobacco or illicit drugs  Current medications and supplements including opioid prescriptions. Patient is not currently taking opioid prescriptions. Functional ability and status Nutritional status Physical activity Advanced directives List of other physicians Hospitalizations, surgeries, and ER visits in previous 12 months Vitals Screenings to include cognitive, depression, and falls Referrals and appointments  In addition, I have reviewed and discussed with patient certain preventive protocols, quality metrics, and best practice recommendations. A written personalized care plan for preventive services as well as general preventive health recommendations were provided to patient.     HRossie Muskrat [place]  10/02/2021   Nurse Notes: N/A

## 2021-10-02 NOTE — Patient Instructions (Signed)
It was great speaking with you today!  Please schedule your next Medicare Wellness Visit with your Nurse Health Advisor in 1 year by calling [phone]. 

## 2021-11-12 DIAGNOSIS — R350 Frequency of micturition: Secondary | ICD-10-CM | POA: Diagnosis not present

## 2021-11-12 DIAGNOSIS — R3912 Poor urinary stream: Secondary | ICD-10-CM | POA: Diagnosis not present

## 2021-12-10 ENCOUNTER — Other Ambulatory Visit: Payer: Self-pay | Admitting: Internal Medicine

## 2021-12-10 NOTE — Telephone Encounter (Signed)
Patient call and needs a refill on his temazepam (Restoril) '15mg'$ .

## 2021-12-17 ENCOUNTER — Ambulatory Visit (INDEPENDENT_AMBULATORY_CARE_PROVIDER_SITE_OTHER): Payer: Medicare Other | Admitting: Internal Medicine

## 2021-12-17 VITALS — BP 130/68 | HR 68 | Temp 97.6°F | Ht 73.0 in | Wt 183.0 lb

## 2021-12-17 DIAGNOSIS — E559 Vitamin D deficiency, unspecified: Secondary | ICD-10-CM

## 2021-12-17 DIAGNOSIS — F32A Depression, unspecified: Secondary | ICD-10-CM | POA: Diagnosis not present

## 2021-12-17 DIAGNOSIS — K219 Gastro-esophageal reflux disease without esophagitis: Secondary | ICD-10-CM

## 2021-12-17 DIAGNOSIS — E7800 Pure hypercholesterolemia, unspecified: Secondary | ICD-10-CM

## 2021-12-17 DIAGNOSIS — M159 Polyosteoarthritis, unspecified: Secondary | ICD-10-CM

## 2021-12-17 MED ORDER — FINASTERIDE 5 MG PO TABS: 5.0000 mg | ORAL_TABLET | Freq: Every day | ORAL | 3 refills | Status: DC

## 2021-12-17 MED ORDER — MIRTAZAPINE 15 MG PO TBDP: 15.0000 mg | ORAL_TABLET | Freq: Every day | ORAL | 3 refills | Status: DC

## 2021-12-17 NOTE — Assessment & Plan Note (Signed)
Last vitamin D Lab Results  Component Value Date   VD25OH 37.89 07/15/2021   Low, to start oral replacement

## 2021-12-17 NOTE — Progress Notes (Signed)
Patient ID: Phillip Shelton, male   DOB: Oct 14, 1934, 87 y.o.   MRN: [id number]        Chief Complaint: follow up generalized OA, gait disorder and bilateral knee pain, depression with worsening sleep appetite and wt loss, GERD       HPI:  Phillip Shelton is a 87 y.o. male here with c/o Needs right shoulder replacement but unable to care for himself (and wife) post op per pt, so holding off.  Also with bilateral knee pain, better with cortisone in the past, but not done recently.  Mobic conts to help.  Also feet and ankle pain.  Also takes tylenol prn.   Not clear why wt loss, but appetite not good recently.  Has had mild worsening depressive symptoms, but no suicidal ideation, or panic; Declines need for SSRI for now.  Has had mild worsening reflux, but no other abd pain, dysphagia, n/v, bowel change or blood  Plans for shinigrix at pharmacy Wt Readings from Last 3 Encounters:  12/17/21 183 lb (83 kg)  09/15/21 185 lb (83.9 kg)  07/20/21 187 lb (84.8 kg)   BP Readings from Last 3 Encounters:  12/17/21 130/68  09/15/21 138/76  07/20/21 (!) 152/80         Past Medical History:  Diagnosis Date   Arthritis    Blood in stool 02/12/2010   Qualifier: Diagnosis of  By: Julaine Hua CMA (AAMA), Amanda     BPH (benign prostatic hyperplasia) 6/81/1572   Complication of anesthesia    Diverticulosis    Esophageal stricture    GERD 02/12/2010   Qualifier: Diagnosis of  By: Julaine Hua CMA (AAMA), Estill Bamberg     GERD (gastroesophageal reflux disease)    Hemorrhoids    Hiatal hernia    Idiopathic Charcot's joint of foot 12/27/2012   Mallory - Weiss tear 1981   OAB (overactive bladder) 12/27/2012   Osteoarthritis of right hip 09/19/2014   PONV (postoperative nausea and vomiting)    Pure hypercholesterolemia 12/27/2012   Past Surgical History:  Procedure Laterality Date   CHOLECYSTECTOMY     COLONOSCOPY     GASTROCNEMIUS RECESSION  02/03/2011   Procedure: GASTROCNEMIUS SLIDE;  Surgeon: Colin Rhein, MD;   Location: [facility];  Service: Orthopedics;  Laterality: Right;   HAND SURGERY  jan 2016   rt hand   HEMORRHOID SURGERY  1990   TOTAL HIP ARTHROPLASTY Right 09/19/2014   Procedure: RIGHT TOTAL HIP ARTHROPLASTY ANTERIOR APPROACH;  Surgeon: Rod Can, MD;  Location: [facility];  Service: Orthopedics;  Laterality: Right;   UMBILICAL HERNIA REPAIR     UPPER GASTROINTESTINAL ENDOSCOPY      reports that he quit smoking about 41 years ago. His smoking use included cigarettes. He has never used smokeless tobacco. He reports current alcohol use. He reports that he does not use drugs. family history includes Arthritis in his father; Colon cancer in his maternal uncle; Hypertension in his father. Allergies  Allergen Reactions   Lipitor [Atorvastatin] Other (See Comments)    Just feels bad and cant sleep   Current Outpatient Medications on File Prior to Visit  Medication Sig Dispense Refill   Acetaminophen (TYLENOL 8 HOUR ARTHRITIS PAIN PO) Take by mouth.     fish oil-omega-3 fatty acids 1000 MG capsule Take 1 g by mouth at bedtime.      meloxicam (MOBIC) 7.5 MG tablet TAKE 1 TABLET BY MOUTH EVERY DAY 90 tablet 1   Multiple Vitamins-Minerals (MULTIVITAMIN ADULTS PO) Take  1 tablet by mouth daily.     omeprazole (PRILOSEC) 20 MG capsule TAKE 1 CAPSULE BY MOUTH DAILY AS NEEDED. 90 capsule 2   silodosin (RAPAFLO) 8 MG CAPS capsule TAKE 1 CAPSULE BY MOUTH EVERYDAY AT BEDTIME 90 capsule 3   temazepam (RESTORIL) 15 MG capsule TAKE 1 CAPSULE BY MOUTH AT BEDTIME AS NEEDED FOR SLEEP. 30 capsule 2   No current facility-administered medications on file prior to visit.        ROS:  All others reviewed and negative.  Objective        PE:  BP 130/68 (BP Location: Right Arm, Patient Position: Sitting, Cuff Size: Large)   Pulse 68   Temp 97.6 F (36.4 C) (Oral)   Ht '6\' 1"'$  (1.854 m)   Wt 183 lb (83 kg)   SpO2 98%   BMI 24.14 kg/m                 Constitutional: Pt appears in NAD                HENT: Head: NCAT.                Right Ear: External ear normal.                 Left Ear: External ear normal.                Eyes: . Pupils are equal, round, and reactive to light. Conjunctivae and EOM are normal               Nose: without d/c or deformity               Neck: Neck supple. Gross normal ROM               Cardiovascular: Normal rate and regular rhythm.                 Pulmonary/Chest: Effort normal and breath sounds without rales or wheezing.                Abd:  Soft, NT, ND, + BS, no organomegaly               Neurological: Pt is alert. At baseline orientation, motor grossly intact               Skin: Skin is warm. No rashes, no other new lesions, LE edema - none               Psychiatric: Pt behavior is normal without agitation   Micro: none  Cardiac tracings I have personally interpreted today:  none  Pertinent Radiological findings (summarize): none   Lab Results  Component Value Date   WBC 5.1 07/15/2021   HGB 12.8 (L) 07/15/2021   HCT 37.9 (L) 07/15/2021   PLT 232.0 07/15/2021   GLUCOSE 89 07/15/2021   CHOL 172 07/15/2021   TRIG 66.0 07/15/2021   HDL 61.20 07/15/2021   LDLDIRECT 133.0 12/22/2012   LDLCALC 98 07/15/2021   ALT 10 07/15/2021   AST 16 07/15/2021   NA 134 (L) 07/15/2021   K 4.4 07/15/2021   CL 102 07/15/2021   CREATININE 0.86 07/15/2021   BUN 16 07/15/2021   CO2 27 07/15/2021   TSH 1.39 07/15/2021   INR 1.04 09/12/2014   MICROALBUR 3.3 (H) 12/26/2013   Assessment/Plan:  Phillip Shelton is a 87 y.o. White or Caucasian [1] male with  has a past medical history  of Arthritis, Blood in stool (02/12/2010), BPH (benign prostatic hyperplasia) (02/04/69), Complication of anesthesia, Diverticulosis, Esophageal stricture, GERD (02/12/2010), GERD (gastroesophageal reflux disease), Hemorrhoids, Hiatal hernia, Idiopathic Charcot's joint of foot (12/27/2012), Mallory - Weiss tear (1981), OAB (overactive bladder) (12/27/2012), Osteoarthritis of right  hip (09/19/2014), PONV (postoperative nausea and vomiting), and Pure hypercholesterolemia (12/27/2012).  Vitamin D deficiency Last vitamin D Lab Results  Component Value Date   VD25OH 37.89 07/15/2021   Low, to start oral replacement   GERD Mild worsening, no red flag symptoms, pt to continue prilosec 20 mg qd, add TUMS prn  Pure hypercholesterolemia Lab Results  Component Value Date   LDLCALC 98 07/15/2021   Stable, pt to continue current low chol diet   Depression Mild to mod but associated with worsening sleep, low appetite, wt loss - for change temazepam to Remeron 15 mg qhs, declines referral for counseling  Generalized osteoarthritis With chronic pain and impairment, gait disorder - pt to continue mobic, tylenol prn, declines referral PT, walks with cane and plans to also f/u with ortho especially for knees and right shoulder  Followup: Return in 3 months (on 03/18/2022), or if symptoms worsen or fail to improve.  Cathlean Cower, MD 12/20/2021 9:46 AM [facility]

## 2021-12-17 NOTE — Patient Instructions (Addendum)
Please have your Shingrix (shingles) shots done at your local pharmacy.  Ok to change the temazepam to generic for Remeron 15 mg at bedtime, to help with the sleep, depression and appetite  Please continue all other medications as before, and refills have been done if requested.  Please have the pharmacy call with any other refills you may need.  Please continue your efforts at being more active, low cholesterol diet, and weight control.  Please keep your appointments with your specialists as you may have planned  We can hold on lab testing today

## 2021-12-20 ENCOUNTER — Encounter: Payer: Self-pay | Admitting: Internal Medicine

## 2021-12-20 DIAGNOSIS — M159 Polyosteoarthritis, unspecified: Secondary | ICD-10-CM | POA: Insufficient documentation

## 2021-12-20 DIAGNOSIS — F32A Depression, unspecified: Secondary | ICD-10-CM | POA: Insufficient documentation

## 2021-12-20 NOTE — Assessment & Plan Note (Signed)
Mild worsening, no red flag symptoms, pt to continue prilosec 20 mg qd, add TUMS prn

## 2021-12-20 NOTE — Assessment & Plan Note (Signed)
Mild to mod but associated with worsening sleep, low appetite, wt loss - for change temazepam to Remeron 15 mg qhs, declines referral for counseling

## 2021-12-20 NOTE — Assessment & Plan Note (Signed)
Lab Results  Component Value Date   LDLCALC 98 07/15/2021   Stable, pt to continue current low chol diet

## 2021-12-20 NOTE — Assessment & Plan Note (Signed)
With chronic pain and impairment, gait disorder - pt to continue mobic, tylenol prn, declines referral PT, walks with cane and plans to also f/u with ortho especially for knees and right shoulder

## 2022-01-15 ENCOUNTER — Other Ambulatory Visit: Payer: Self-pay | Admitting: Internal Medicine

## 2022-01-18 DIAGNOSIS — L821 Other seborrheic keratosis: Secondary | ICD-10-CM | POA: Diagnosis not present

## 2022-01-18 DIAGNOSIS — Z85828 Personal history of other malignant neoplasm of skin: Secondary | ICD-10-CM | POA: Diagnosis not present

## 2022-01-18 DIAGNOSIS — L570 Actinic keratosis: Secondary | ICD-10-CM | POA: Diagnosis not present

## 2022-01-18 DIAGNOSIS — D044 Carcinoma in situ of skin of scalp and neck: Secondary | ICD-10-CM | POA: Diagnosis not present

## 2022-01-18 DIAGNOSIS — D0439 Carcinoma in situ of skin of other parts of face: Secondary | ICD-10-CM | POA: Diagnosis not present

## 2022-01-18 DIAGNOSIS — L814 Other melanin hyperpigmentation: Secondary | ICD-10-CM | POA: Diagnosis not present

## 2022-01-19 DIAGNOSIS — M170 Bilateral primary osteoarthritis of knee: Secondary | ICD-10-CM | POA: Diagnosis not present

## 2022-01-20 ENCOUNTER — Ambulatory Visit (INDEPENDENT_AMBULATORY_CARE_PROVIDER_SITE_OTHER): Payer: Medicare Other | Admitting: Internal Medicine

## 2022-01-20 ENCOUNTER — Encounter: Payer: Self-pay | Admitting: Internal Medicine

## 2022-01-20 VITALS — BP 132/78 | HR 109 | Temp 97.7°F | Ht 73.0 in | Wt 184.0 lb

## 2022-01-20 DIAGNOSIS — E559 Vitamin D deficiency, unspecified: Secondary | ICD-10-CM

## 2022-01-20 DIAGNOSIS — T466X5A Adverse effect of antihyperlipidemic and antiarteriosclerotic drugs, initial encounter: Secondary | ICD-10-CM | POA: Diagnosis not present

## 2022-01-20 DIAGNOSIS — R066 Hiccough: Secondary | ICD-10-CM | POA: Insufficient documentation

## 2022-01-20 DIAGNOSIS — E7800 Pure hypercholesterolemia, unspecified: Secondary | ICD-10-CM | POA: Diagnosis not present

## 2022-01-20 DIAGNOSIS — F32A Depression, unspecified: Secondary | ICD-10-CM

## 2022-01-20 DIAGNOSIS — Z0001 Encounter for general adult medical examination with abnormal findings: Secondary | ICD-10-CM | POA: Diagnosis not present

## 2022-01-20 DIAGNOSIS — R739 Hyperglycemia, unspecified: Secondary | ICD-10-CM | POA: Diagnosis not present

## 2022-01-20 DIAGNOSIS — E538 Deficiency of other specified B group vitamins: Secondary | ICD-10-CM

## 2022-01-20 DIAGNOSIS — G720 Drug-induced myopathy: Secondary | ICD-10-CM | POA: Diagnosis not present

## 2022-01-20 LAB — CBC WITH DIFFERENTIAL/PLATELET
Basophils Absolute: 0 10*3/uL (ref 0.0–0.1)
Basophils Relative: 0.5 % (ref 0.0–3.0)
Eosinophils Absolute: 0 10*3/uL (ref 0.0–0.7)
Eosinophils Relative: 0.2 % (ref 0.0–5.0)
HCT: 37.4 % — ABNORMAL LOW (ref 39.0–52.0)
Hemoglobin: 12.8 g/dL — ABNORMAL LOW (ref 13.0–17.0)
Lymphocytes Relative: 18.3 % (ref 12.0–46.0)
Lymphs Abs: 1.5 10*3/uL (ref 0.7–4.0)
MCHC: 34.3 g/dL (ref 30.0–36.0)
MCV: 91.7 fl (ref 78.0–100.0)
Monocytes Absolute: 1.1 10*3/uL — ABNORMAL HIGH (ref 0.1–1.0)
Monocytes Relative: 13.2 % — ABNORMAL HIGH (ref 3.0–12.0)
Neutro Abs: 5.5 10*3/uL (ref 1.4–7.7)
Neutrophils Relative %: 67.8 % (ref 43.0–77.0)
Platelets: 254 10*3/uL (ref 150.0–400.0)
RBC: 4.07 Mil/uL — ABNORMAL LOW (ref 4.22–5.81)
RDW: 13.6 % (ref 11.5–15.5)
WBC: 8.1 10*3/uL (ref 4.0–10.5)

## 2022-01-20 LAB — LIPID PANEL
Cholesterol: 182 mg/dL (ref 0–200)
HDL: 73.9 mg/dL (ref >39.00)
LDL Cholesterol: 95 mg/dL (ref 0–99)
NonHDL: 108.11
Total CHOL/HDL Ratio: 2
Triglycerides: 68 mg/dL (ref 0.0–149.0)
VLDL: 13.6 mg/dL (ref 0.0–40.0)

## 2022-01-20 LAB — HEPATIC FUNCTION PANEL
ALT: 9 U/L (ref 0–53)
AST: 13 U/L (ref 0–37)
Albumin: 4.5 g/dL (ref 3.5–5.2)
Alkaline Phosphatase: 66 U/L (ref 39–117)
Bilirubin, Direct: 0.2 mg/dL (ref 0.0–0.3)
Total Bilirubin: 0.8 mg/dL (ref 0.2–1.2)
Total Protein: 7.6 g/dL (ref 6.0–8.3)

## 2022-01-20 LAB — URINALYSIS, ROUTINE W REFLEX MICROSCOPIC
Bilirubin Urine: NEGATIVE
Hgb urine dipstick: NEGATIVE
Ketones, ur: NEGATIVE
Leukocytes,Ua: NEGATIVE
Nitrite: NEGATIVE
RBC / HPF: NONE SEEN (ref 0–?)
Specific Gravity, Urine: 1.015 (ref 1.000–1.030)
Total Protein, Urine: NEGATIVE
Urine Glucose: NEGATIVE
Urobilinogen, UA: 0.2 (ref 0.0–1.0)
pH: 6 (ref 5.0–8.0)

## 2022-01-20 LAB — HEMOGLOBIN A1C: Hgb A1c MFr Bld: 5.6 % (ref 4.6–6.5)

## 2022-01-20 LAB — VITAMIN D 25 HYDROXY (VIT D DEFICIENCY, FRACTURES): VITD: 37.5 ng/mL (ref 30.00–100.00)

## 2022-01-20 LAB — BASIC METABOLIC PANEL
BUN: 18 mg/dL (ref 6–23)
CO2: 27 mEq/L (ref 19–32)
Calcium: 9.8 mg/dL (ref 8.4–10.5)
Chloride: 99 mEq/L (ref 96–112)
Creatinine, Ser: 0.84 mg/dL (ref 0.40–1.50)
GFR: 79.76 mL/min (ref >60.00)
Glucose, Bld: 106 mg/dL — ABNORMAL HIGH (ref 70–99)
Potassium: 4.3 mEq/L (ref 3.5–5.1)
Sodium: 135 mEq/L (ref 135–145)

## 2022-01-20 LAB — VITAMIN B12: Vitamin B-12: 305 pg/mL (ref 211–911)

## 2022-01-20 LAB — TSH: TSH: 0.89 u[IU]/mL (ref 0.35–5.50)

## 2022-01-20 NOTE — Assessment & Plan Note (Signed)
Pt with mild persistent intermittent x 3 days, now resolved, consider thorazine if recurs

## 2022-01-20 NOTE — Progress Notes (Signed)
Patient ID: Phillip Shelton, male   DOB: 06-25-1934, 88 y.o.   MRN: [id number]         Chief Complaint:: wellness exam and 75mofollow up  With hld, low vit d, depression       HPI:  Phillip KADLECis a 88y.o. male here for wellness exam; decliens covid booster, may consider shingrix at pharmacy, o/w up to date                        Also Pt denies chest pain, increased sob or doe, wheezing, orthopnea, PND, increased LE swelling, palpitations, dizziness or syncope.   Pt denies polydipsia, polyuria, or new focal neuro s/s.    Pt denies fever, wt loss, night sweats, loss of appetite, or other constitutional symptoms  .  Denies worsening reflux, abd pain, dysphagia, n/v, bowel change or blood.  Has had mild worsening depressive symptoms, but no suicidal ideation, or panic; has ongoing anxiety, not increased recently. Wife died now 353yrs ago.  Not taking the remeron yet.     Wt Readings from Last 3 Encounters:  01/20/22 184 lb (83.5 kg)  12/17/21 183 lb (83 kg)  09/15/21 185 lb (83.9 kg)   BP Readings from Last 3 Encounters:  01/20/22 132/78  12/17/21 130/68  09/15/21 138/76   Immunization History  Administered Date(s) Administered   Fluad Quad(high Dose 65+) 10/04/2018, 10/02/2019, 10/01/2020, 09/15/2021   Influenza, High Dose Seasonal PF 10/21/2015, 10/11/2016, 10/21/2017   Influenza,inj,Quad PF,6+ Mos 10/13/2012, 10/29/2013, 11/20/2014   Influenza,inj,quad, With Preservative 10/05/2018   Influenza-Unspecified 11/04/2020   PFIZER(Purple Top)SARS-COV-2 Vaccination 02/10/2019, 03/07/2019, 10/20/2019   Pfizer Covid-19 Vaccine Bivalent Booster 15yr& up 10/23/2020   Pneumococcal Conjugate-13 12/26/2013   Pneumococcal Polysaccharide-23 12/03/2004, 04/20/2016   Tdap 03/27/2014   Zoster, Live 07/28/2010  There are no preventive care reminders to display for this patient.    Past Medical History:  Diagnosis Date   Arthritis    Blood in stool 02/12/2010   Qualifier: Diagnosis of  By:  LeJulaine HuaMA (AAMA), Amanda     BPH (benign prostatic hyperplasia) 01/07/74/5465 Complication of anesthesia    Diverticulosis    Esophageal stricture    GERD 02/12/2010   Qualifier: Diagnosis of  By: LeJulaine HuaMA (AAMA), AmEstill Bamberg   GERD (gastroesophageal reflux disease)    Hemorrhoids    Hiatal hernia    Idiopathic Charcot's joint of foot 12/27/2012   Mallory - Weiss tear 1981   OAB (overactive bladder) 12/27/2012   Osteoarthritis of right hip 09/19/2014   PONV (postoperative nausea and vomiting)    Pure hypercholesterolemia 12/27/2012   Past Surgical History:  Procedure Laterality Date   CHOLECYSTECTOMY     COLONOSCOPY     GASTROCNEMIUS RECESSION  02/03/2011   Procedure: GASTROCNEMIUS SLIDE;  Surgeon: PaColin RheinMD;  Location: [facility] Service: Orthopedics;  Laterality: Right;   HAND SURGERY  jan 2016   rt hand   HEMORRHOID SURGERY  1990   TOTAL HIP ARTHROPLASTY Right 09/19/2014   Procedure: RIGHT TOTAL HIP ARTHROPLASTY ANTERIOR APPROACH;  Surgeon: BrRod CanMD;  Location: [facility];  Service: Orthopedics;  Laterality: Right;   UMBILICAL HERNIA REPAIR     UPPER GASTROINTESTINAL ENDOSCOPY      reports that he quit smoking about 42 years ago. His smoking use included cigarettes. He has never used smokeless tobacco. He reports current alcohol use. He reports that he does  not use drugs. family history includes Arthritis in his father; Colon cancer in his maternal uncle; Hypertension in his father. Allergies  Allergen Reactions   Lipitor [Atorvastatin] Other (See Comments)    Just feels bad and cant sleep   Current Outpatient Medications on File Prior to Visit  Medication Sig Dispense Refill   Acetaminophen (TYLENOL 8 HOUR ARTHRITIS PAIN PO) Take by mouth.     finasteride (PROSCAR) 5 MG tablet Take 1 tablet (5 mg total) by mouth daily. 90 tablet 3   fish oil-omega-3 fatty acids 1000 MG capsule Take 1 g by mouth at bedtime.      fluorouracil (EFUDEX) 5 %  cream Apply 1 Application topically 2 (two) times daily.     meloxicam (MOBIC) 7.5 MG tablet TAKE 1 TABLET BY MOUTH EVERY DAY 90 tablet 1   mirtazapine (REMERON SOL-TAB) 15 MG disintegrating tablet Take 1 tablet (15 mg total) by mouth at bedtime. 90 tablet 3   Multiple Vitamins-Minerals (MULTIVITAMIN ADULTS PO) Take 1 tablet by mouth daily.     omeprazole (PRILOSEC) 20 MG capsule TAKE 1 CAPSULE BY MOUTH DAILY AS NEEDED. 90 capsule 2   silodosin (RAPAFLO) 8 MG CAPS capsule TAKE 1 CAPSULE BY MOUTH EVERYDAY AT BEDTIME 90 capsule 3   temazepam (RESTORIL) 15 MG capsule TAKE 1 CAPSULE BY MOUTH AT BEDTIME AS NEEDED FOR SLEEP. 30 capsule 2   No current facility-administered medications on file prior to visit.        ROS:  All others reviewed and negative.  Objective        PE:  BP 132/78 (BP Location: Right Arm, Patient Position: Sitting, Cuff Size: Large)   Pulse (!) 109   Temp 97.7 F (36.5 C) (Oral)   Ht '6\' 1"'$  (1.854 m)   Wt 184 lb (83.5 kg)   SpO2 97%   BMI 24.28 kg/m                 Constitutional: Pt appears in NAD               HENT: Head: NCAT.                Right Ear: External ear normal.                 Left Ear: External ear normal.                Eyes: . Pupils are equal, round, and reactive to light. Conjunctivae and EOM are normal               Nose: without d/c or deformity               Neck: Neck supple. Gross normal ROM               Cardiovascular: Normal rate and regular rhythm.                 Pulmonary/Chest: Effort normal and breath sounds without rales or wheezing.                Abd:  Soft, NT, ND, + BS, no organomegaly               Neurological: Pt is alert. At baseline orientation, motor grossly intact               Skin: Skin is warm. No rashes, no other new lesions, LE edema - none  Psychiatric: Pt behavior is normal without agitation , depressed affect  Micro: none  Cardiac tracings I have personally interpreted today:  none  Pertinent  Radiological findings (summarize): none   Lab Results  Component Value Date   WBC 8.1 01/20/2022   HGB 12.8 (L) 01/20/2022   HCT 37.4 (L) 01/20/2022   PLT 254.0 01/20/2022   GLUCOSE 106 (H) 01/20/2022   CHOL 182 01/20/2022   TRIG 68.0 01/20/2022   HDL 73.90 01/20/2022   LDLDIRECT 133.0 12/22/2012   LDLCALC 95 01/20/2022   ALT 9 01/20/2022   AST 13 01/20/2022   NA 135 01/20/2022   K 4.3 01/20/2022   CL 99 01/20/2022   CREATININE 0.84 01/20/2022   BUN 18 01/20/2022   CO2 27 01/20/2022   TSH 0.89 01/20/2022   INR 1.04 09/12/2014   HGBA1C 5.6 01/20/2022   MICROALBUR 3.3 (H) 12/26/2013   Assessment/Plan:  Phillip Shelton is a 88 y.o. White or Caucasian [1] male with  has a past medical history of Arthritis, Blood in stool (02/12/2010), BPH (benign prostatic hyperplasia) (3/42/8768), Complication of anesthesia, Diverticulosis, Esophageal stricture, GERD (02/12/2010), GERD (gastroesophageal reflux disease), Hemorrhoids, Hiatal hernia, Idiopathic Charcot's joint of foot (12/27/2012), Mallory - Weiss tear (1981), OAB (overactive bladder) (12/27/2012), Osteoarthritis of right hip (09/19/2014), PONV (postoperative nausea and vomiting), and Pure hypercholesterolemia (12/27/2012).  Encounter for well adult exam with abnormal findings Age and sex appropriate education and counseling updated with regular exercise and diet Referrals for preventative services - none needed Immunizations addressed - declines covid booster, for shignrx at pharmacy Smoking counseling  - none needed Evidence for depression or other mood disorder - mild worsening depression, declines SSRI or other tx or referral Most recent labs reviewed. I have personally reviewed and have noted: 1) the patient's medical and social history 2) The patient's current medications and supplements 3) The patient's height, weight, and BMI have been recorded in the chart   Pure hypercholesterolemia Lab Results  Component Value Date    [place] 01/20/2022   Stable, pt to continue current low chol diet, has been prior statin intolerant  Statin myopathy Declines further statin trial  Vitamin D deficiency Last vitamin D Lab Results  Component Value Date   VD25OH 37.50 01/20/2022   Low, to start oral replacement  Depression Mild chronic persistent, start remeron 15 qhs  Hiccups Pt with mild persistent intermittent x 3 days, now resolved, consider thorazine if recurs Followup: Return in about 6 months (around 07/21/2022).  Cathlean Cower, MD 01/20/2022 8:33 PM [facility]

## 2022-01-20 NOTE — Assessment & Plan Note (Addendum)
Mild chronic persistent, start remeron 15 qhs

## 2022-01-20 NOTE — Assessment & Plan Note (Signed)
Declines further statin trial

## 2022-01-20 NOTE — Patient Instructions (Signed)
Ok to start the Remeron as you have at home, and hold off on taking the Temazepam  Please continue all other medications as before, and refills have been done if requested.  Please have the pharmacy call with any other refills you may need.  Please continue your efforts at being more active, low cholesterol diet, and weight control.  You are otherwise up to date with prevention measures today.  Please keep your appointments with your specialists as you may have planned  Please go to the LAB at the blood drawing area for the tests to be done  You will be contacted by phone if any changes need to be made immediately.  Otherwise, you will receive a letter about your results with an explanation, but please check with MyChart first.  Please remember to sign up for MyChart if you have not done so, as this will be important to you in the future with finding out test results, communicating by private email, and scheduling acute appointments online when needed.  Please make an Appointment to return in 6 months, or sooner if needed

## 2022-01-20 NOTE — Assessment & Plan Note (Signed)
Last vitamin D Lab Results  Component Value Date   VD25OH 37.50 01/20/2022   Low, to start oral replacement

## 2022-01-20 NOTE — Assessment & Plan Note (Signed)
Lab Results  Component Value Date   Volin 01/20/2022   Stable, pt to continue current low chol diet, has been prior statin intolerant

## 2022-01-20 NOTE — Assessment & Plan Note (Signed)
Age and sex appropriate education and counseling updated with regular exercise and diet Referrals for preventative services - none needed Immunizations addressed - declines covid booster, for shignrx at pharmacy Smoking counseling  - none needed Evidence for depression or other mood disorder - mild worsening depression, declines SSRI or other tx or referral Most recent labs reviewed. I have personally reviewed and have noted: 1) the patient's medical and social history 2) The patient's current medications and supplements 3) The patient's height, weight, and BMI have been recorded in the chart

## 2022-01-28 DIAGNOSIS — H2513 Age-related nuclear cataract, bilateral: Secondary | ICD-10-CM | POA: Diagnosis not present

## 2022-01-28 DIAGNOSIS — H524 Presbyopia: Secondary | ICD-10-CM | POA: Diagnosis not present

## 2022-02-26 ENCOUNTER — Telehealth: Payer: Self-pay | Admitting: Internal Medicine

## 2022-02-26 NOTE — Telephone Encounter (Signed)
Patient called stated he stopped taking the mirtazapine '15mg'$  due to it making him restless and he couldn't sleep and was getting dry mouth. States he is still taking his temazepam '15mg'$  to help him sleep.

## 2022-02-26 NOTE — Telephone Encounter (Signed)
Ok we'll need to continue the temazepam then, thanks

## 2022-03-01 NOTE — Telephone Encounter (Signed)
Patient informed to continue temazepam and discontinue the mirtazapine.

## 2022-03-11 ENCOUNTER — Telehealth: Payer: Self-pay | Admitting: Internal Medicine

## 2022-03-11 MED ORDER — TEMAZEPAM 15 MG PO CAPS: ORAL_CAPSULE | ORAL | 2 refills | Status: DC

## 2022-03-11 NOTE — Telephone Encounter (Signed)
Done erx 

## 2022-03-11 NOTE — Telephone Encounter (Signed)
Prescription Request  03/11/2022  LOV: 01/20/2022  What is the name of the medication or equipment? temazepam (RESTORIL) 15 MG capsule   Have you contacted your pharmacy to request a refill? No   Which pharmacy would you like this sent to?  [id number] [place], [place] - [address] [place] [address]Phone: [phone]: [phone]   Patient notified that their request is being sent to the clinical staff for review and that they should receive a response within 2 business days.   [phone]

## 2022-04-02 DIAGNOSIS — M19011 Primary osteoarthritis, right shoulder: Secondary | ICD-10-CM | POA: Diagnosis not present

## 2022-04-13 DIAGNOSIS — H6121 Impacted cerumen, right ear: Secondary | ICD-10-CM | POA: Insufficient documentation

## 2022-04-23 ENCOUNTER — Telehealth: Payer: Self-pay

## 2022-04-23 NOTE — Telephone Encounter (Signed)
Please advise, patient inquiring about OTC meds for constipation

## 2022-04-23 NOTE — Telephone Encounter (Signed)
Ok sure  - miralax 17 gm by mouth once daily as needed is usually effective and very safe, thanks

## 2022-04-23 NOTE — Telephone Encounter (Signed)
Patient agrees to take Miralax

## 2022-04-23 NOTE — Telephone Encounter (Signed)
Pt is asking that a nurse give him a call back in regards to what he can take OTC for constipation.  **Pt is asking that Dr. Jonny Ruiz please let him know what it is he can take to help?

## 2022-05-12 DIAGNOSIS — M542 Cervicalgia: Secondary | ICD-10-CM | POA: Diagnosis not present

## 2022-05-12 DIAGNOSIS — M5030 Other cervical disc degeneration, unspecified cervical region: Secondary | ICD-10-CM | POA: Diagnosis not present

## 2022-05-19 DIAGNOSIS — R350 Frequency of micturition: Secondary | ICD-10-CM | POA: Diagnosis not present

## 2022-05-19 DIAGNOSIS — R3912 Poor urinary stream: Secondary | ICD-10-CM | POA: Diagnosis not present

## 2022-05-20 DIAGNOSIS — H905 Unspecified sensorineural hearing loss: Secondary | ICD-10-CM | POA: Diagnosis not present

## 2022-06-03 ENCOUNTER — Encounter: Payer: Self-pay | Admitting: Gastroenterology

## 2022-06-10 ENCOUNTER — Other Ambulatory Visit: Payer: Self-pay | Admitting: Internal Medicine

## 2022-06-16 NOTE — Progress Notes (Signed)
06/17/2022 SHRIHAN DICOLA [id number] January 07, 1934  Referring provider: Corwin Levins, MD Primary GI doctor: Dr. Russella Dar  ASSESSMENT AND PLAN:   Esophageal dysphagia with GERD Barium swallow to evaluate for stenosis/stricture, with intermittent symptoms I think this is more GERD related Started on prilosec 20 mg daily Patient high risk for EGD, if shows stenosis/stricture can schedule for [facility] discussed risks of EGD with patient today, including risk of sedation, bleeding or perforation.  Patient provides understanding and gave verbal consent to proceed.  Gait disorder Walks with walker No evidence of muscular DO, no oropharyngeal DO Can consider MBS if Barium negative or shows aspiration  Anemia, unspecified type CBC on 01/20/2022  HGB 12.8 MCV 91.7 Platelets 254.0 Anemia studies on 07/20/2021 Iron 111 Ferritin 188.1 B12 305 No iron def, no microcytosis Add on B12 OTC     Patient Care Team: Corwin Levins, MD as PCP - General (Internal Medicine) Sinda Du, MD as Consulting Physician (Ophthalmology)  HISTORY OF PRESENT ILLNESS: 88 y.o. male with a past medical history of GERD, hyperlipidemia, hyponatremia, gait disorder, anemia and others listed below presents for evaluation of swallowing issues.   06/26/2015 colonoscopy Dr. Russella Dar for family history of colon cancer in first-degree relative, good bowel prep moderate diverticulosis sigmoid colon, internal hemorrhoids no repeat colonoscopy secondary to age  83/28/2020 negative FOBT 07/15/2021 Hgb 12.8, previously 14.1 a year prior, MCV 93.3, iron 111, saturation ratios 30, ferritin 188 01/20/2022 Hgb 12.8 MCV 91.9, normal platelets, B12 305  Patient is walking with a walker.  He states he has had swallowing issues for a long time.  When he is eating he would feel it get stuck lower esophagus.  2 weeks ago he was eating hamburger and fries and felt that it was stuck, he tried to drink water without help.  He states  he coughed some up and he felt better.  No issues with liquids, just solids. No oropharyngeal symptoms, no coughing during eating.  He admits to taking large bites.  He denies of melena, AB pain, nausea, vomiting.  He has lost 10 lbs in last year.  He is on mobic 7.5 mg daily for years.  He is on prilosec 20 mg 2-3 x a week only, denies GERD.   Denies history of neck surgery or radiation to the neck.  Has arthritis in his neck.  He denies blood thinner use.  He denies ETOH use.   He use to smoke but quit 40+ years ago.  He denies drug use.    Wt Readings from Last 5 Encounters:  06/17/22 176 lb 6 oz (80 kg)  01/20/22 184 lb (83.5 kg)  12/17/21 183 lb (83 kg)  09/15/21 185 lb (83.9 kg)  07/20/21 187 lb (84.8 kg)    He  reports that he quit smoking about 42 years ago. His smoking use included cigarettes. He has never used smokeless tobacco. He reports current alcohol use. He reports that he does not use drugs.  RELEVANT LABS AND IMAGING: CBC    Component Value Date/Time   WBC 8.1 01/20/2022 1439   RBC 4.07 (L) 01/20/2022 1439   HGB 12.8 (L) 01/20/2022 1439   HCT 37.4 (L) 01/20/2022 1439   PLT 254.0 01/20/2022 1439   MCV 91.7 01/20/2022 1439   MCH 32.1 07/18/2019 1422   MCHC 34.3 01/20/2022 1439   RDW 13.6 01/20/2022 1439   LYMPHSABS 1.5 01/20/2022 1439   MONOABS 1.1 (H) 01/20/2022 1439  EOSABS 0.0 01/20/2022 1439   BASOSABS 0.0 01/20/2022 1439   Recent Labs    07/15/21 1327 01/20/22 1439  HGB 12.8* 12.8*    CMP     Component Value Date/Time   NA 135 01/20/2022 1439   K 4.3 01/20/2022 1439   CL 99 01/20/2022 1439   CO2 27 01/20/2022 1439   GLUCOSE 106 (H) 01/20/2022 1439   BUN 18 01/20/2022 1439   CREATININE 0.84 01/20/2022 1439   CREATININE 0.98 07/18/2019 1422   CALCIUM 9.8 01/20/2022 1439   PROT 7.6 01/20/2022 1439   ALBUMIN 4.5 01/20/2022 1439   AST 13 01/20/2022 1439   ALT 9 01/20/2022 1439   ALKPHOS 66 01/20/2022 1439   BILITOT 0.8 01/20/2022  1439   GFRNONAA >60 09/20/2014 0440   GFRAA >60 09/20/2014 0440      Latest Ref Rng & Units 01/20/2022    2:39 PM 07/15/2021    1:27 PM 07/18/2020    2:49 PM  Hepatic Function  Total Protein 6.0 - 8.3 g/dL 7.6  7.1  7.5   Albumin 3.5 - 5.2 g/dL 4.5  4.3  4.4   AST 0 - 37 U/L 13  16  18    ALT 0 - 53 U/L 9  10  11    Alk Phosphatase 39 - 117 U/L 66  78  69   Total Bilirubin 0.2 - 1.2 mg/dL 0.8  0.7  0.7   Bilirubin, Direct 0.0 - 0.3 mg/dL 0.2  0.2  0.1       Current Medications:      Current Outpatient Medications (Analgesics):    Acetaminophen (TYLENOL 8 HOUR ARTHRITIS PAIN PO), Take by mouth.   meloxicam (MOBIC) 7.5 MG tablet, TAKE 1 TABLET BY MOUTH EVERY DAY   Current Outpatient Medications (Other):    finasteride (PROSCAR) 5 MG tablet, Take 1 tablet (5 mg total) by mouth daily.   fish oil-omega-3 fatty acids 1000 MG capsule, Take 1 g by mouth at bedtime.    Multiple Vitamins-Minerals (MULTIVITAMIN ADULTS PO), Take 1 tablet by mouth daily.   silodosin (RAPAFLO) 8 MG CAPS capsule, TAKE 1 CAPSULE BY MOUTH EVERYDAY AT BEDTIME   temazepam (RESTORIL) 15 MG capsule, TAKE 1 CAPSULE BY MOUTH AT BEDTIME AS NEEDED FOR SLEEP   omeprazole (PRILOSEC) 20 MG capsule, Take 1 capsule (20 mg total) by mouth daily.  Medical History:  Past Medical History:  Diagnosis Date   Arthritis    Blood in stool 02/12/2010   Qualifier: Diagnosis of  By: Misty Stanley CMA (AAMA), Amairani Shuey     BPH (benign prostatic hyperplasia) 01/18/2020   Complication of anesthesia    Diverticulosis    Esophageal stricture    GERD 02/12/2010   Qualifier: Diagnosis of  By: Misty Stanley CMA (AAMA), Marchelle Folks     GERD (gastroesophageal reflux disease)    Hemorrhoids    Hiatal hernia    Idiopathic Charcot's joint of foot 12/27/2012   Mallory - Weiss tear 1981   OAB (overactive bladder) 12/27/2012   Osteoarthritis of right hip 09/19/2014   PONV (postoperative nausea and vomiting)    Pure hypercholesterolemia 12/27/2012   Allergies:   Allergies  Allergen Reactions   Lipitor [Atorvastatin] Other (See Comments)    Just feels bad and cant sleep     Surgical History:  He  has a past surgical history that includes Hemorrhoid surgery (1990); Umbilical hernia repair; Cholecystectomy; Colonoscopy; Upper gastrointestinal endoscopy; Gastrocnemius Recession (02/03/2011); Hand surgery (jan 2016); and Total hip arthroplasty (Right, 09/19/2014). Family  History:  His family history includes Arthritis in his father; Colon cancer in his maternal uncle; Hypertension in his father.  REVIEW OF SYSTEMS  : All other systems reviewed and negative except where noted in the History of Present Illness.  PHYSICAL EXAM: BP 114/62   Pulse 93   Ht 6' (1.829 m)   Wt 176 lb 6 oz (80 kg)   BMI 23.92 kg/m  General Appearance: Elderly appearing  in no apparent distress. Head:   Normocephalic and atraumatic. Eyes:  sclerae anicteric,conjunctive pink  Respiratory: Respiratory effort normal, BS equal bilaterally without rales, rhonchi, wheezing. Cardio: RRR with no MRGs. Peripheral pulses intact.  Abdomen: Soft,  Obese ,active bowel sounds. No tenderness . Without guarding and Without rebound. No masses. Rectal: Not evaluated Musculoskeletal: Full ROM, Antalgic and Ambulates with walker With edema. Skin:  Dry and intact without significant lesions or rashes Neuro: Alert and  oriented x4;  No focal deficits. Psych:  Cooperative. Normal mood and affect.    Doree Albee, PA-C 2:31 PM

## 2022-06-17 ENCOUNTER — Encounter: Payer: Self-pay | Admitting: Physician Assistant

## 2022-06-17 ENCOUNTER — Ambulatory Visit: Payer: Medicare Other | Admitting: Physician Assistant

## 2022-06-17 VITALS — BP 114/62 | HR 93 | Ht 72.0 in | Wt 176.4 lb

## 2022-06-17 DIAGNOSIS — R269 Unspecified abnormalities of gait and mobility: Secondary | ICD-10-CM

## 2022-06-17 DIAGNOSIS — R1319 Other dysphagia: Secondary | ICD-10-CM

## 2022-06-17 DIAGNOSIS — D649 Anemia, unspecified: Secondary | ICD-10-CM | POA: Diagnosis not present

## 2022-06-17 DIAGNOSIS — K219 Gastro-esophageal reflux disease without esophagitis: Secondary | ICD-10-CM | POA: Diagnosis not present

## 2022-06-17 MED ORDER — OMEPRAZOLE 20 MG PO CPDR: 20.0000 mg | DELAYED_RELEASE_CAPSULE | Freq: Every day | ORAL | 2 refills | Status: AC

## 2022-06-17 NOTE — Patient Instructions (Addendum)
You have been scheduled for a Barium Esophogram at [facility] Radiology (1st floor of the hospital) on 07/06/2022 at 11:00am. Please arrive 30 minutes prior to your appointment for registration. Make certain not to have anything to eat or drink 3 hours prior to your test. If you need to reschedule for any reason, please contact radiology at [phone] to do so. __________________________________________________________________ A barium swallow is an examination that concentrates on views of the esophagus. This tends to be a double contrast exam (barium and two liquids which, when combined, create a gas to distend the wall of the oesophagus) or single contrast (non-ionic iodine based). The study is usually tailored to your symptoms so a good history is essential. Attention is paid during the study to the form, structure and configuration of the esophagus, looking for functional disorders (such as aspiration, dysphagia, achalasia, motility and reflux) EXAMINATION You may be asked to change into a gown, depending on the type of swallow being performed. A radiologist and radiographer will perform the procedure. The radiologist will advise you of the type of contrast selected for your procedure and direct you during the exam. You will be asked to stand, sit or lie in several different positions and to hold a small amount of fluid in your mouth before being asked to swallow while the imaging is performed .In some instances you may be asked to swallow barium coated marshmallows to assess the motility of a solid food bolus. The exam can be recorded as a digital or video fluoroscopy procedure. POST PROCEDURE It will take 1-2 days for the barium to pass through your system. To facilitate this, it is important, unless otherwise directed, to increase your fluids for the next 24-48hrs and to resume your normal diet.  This test typically takes about 30 minutes to  perform. __________________________________________________________________________________  We have sent the following medications to your pharmacy for you to pick up at your convenience: Omeprazole   Behavioral and Dietary Strategies for Management of Esophageal Dysmotility/dysphagia 1. Take reflux PRILOSEC EVERY DAY medications 30+ minutes before food in the morning FOR AT LEAST 8 WEEKS.  2. Begin meals with warm beverage 3. Eat smaller more frequent meals 4. Eat slowly, taking small bites and sips 5. Alternate solids and liquids 6. Avoid foods/liquids that increase acid production 7. Sit upright during and for 30+ minutes after meals to facilitate esophageal clearing 8. Can try altoid melting in mouth before food  WILL GET BARIUM SWALLOW   B12 is mainly in meat, so increase meat can help this but often people have a deficiency that they are just not absorbing it well with meat or pills, so get the sublingual/melt in your mouth one.   If the sublingual one dose not increase your level, we will discuss shots.   Vitamin B12 Deficiency Vitamin B12 deficiency occurs when the body does not have enough vitamin B12, which is an important vitamin. The body needs this vitamin: To make red blood cells. To make DNA. This is the genetic material inside cells. To help the nerves work properly so they can carry messages from the brain to the body. Vitamin B12 deficiency can cause various health problems, such as a low red blood cell count (anemia) or nerve damage. What are the causes? This condition may be caused by: Not eating enough foods that contain vitamin B12. Not having enough stomach acid and digestive fluids to properly absorb vitamin B12 from the food that you eat. Certain digestive system diseases that make it  hard to absorb vitamin B12. These diseases include Crohn's disease, chronic pancreatitis, and cystic fibrosis. A condition in which the body does not make enough of a protein  (intrinsic factor), resulting in too few red blood cells (pernicious anemia). Having a surgery in which part of the stomach or small intestine is removed. Taking certain medicines that make it hard for the body to absorb vitamin B12. These medicines include: Heartburn medicines (antacids and proton pump inhibitors). Certain antibiotic medicines. Some medicines that are used to treat diabetes, tuberculosis, gout, or high cholesterol. What increases the risk? The following factors may make you more likely to develop a B12 deficiency: Being older than age 80. Eating a vegetarian or vegan diet, especially while you are pregnant. Eating a poor diet while you are pregnant. Taking certain medicines. Having alcoholism. What are the signs or symptoms? In some cases, there are no symptoms of this condition. If the condition leads to anemia or nerve damage, various symptoms can occur, such as: Weakness. Fatigue. Loss of appetite. Weight loss. Numbness or tingling in your hands and feet. Redness and burning of the tongue. Confusion or memory problems. Depression. Sensory problems, such as color blindness, ringing in the ears, or loss of taste. Diarrhea or constipation. Trouble walking. If anemia is severe, symptoms can include: Shortness of breath. Dizziness. Rapid heart rate (tachycardia). How is this diagnosed? This condition may be diagnosed with a blood test to measure the level of vitamin B12 in your blood. You may also have other tests, including: A group of tests that measure certain characteristics of blood cells (complete blood count, CBC). A blood test to measure intrinsic factor. A procedure where a thin tube with a camera on the end is used to look into your stomach or intestines (endoscopy). Other tests may be needed to discover the cause of B12 deficiency. How is this treated? Treatment for this condition depends on the cause. This condition may be treated by: Changing your  eating and drinking habits, such as: Eating more foods that contain vitamin B12. Drinking less alcohol or no alcohol. Getting vitamin B12 injections. Taking vitamin B12 supplements. Your health care provider will tell you which dosage is best for you. Follow these instructions at home: Eating and drinking  Eat lots of healthy foods that contain vitamin B12, including: Meats and poultry. This includes beef, pork, chicken, [place], and organ meats, such as liver. Seafood. This includes clams, rainbow trout, salmon, tuna, and haddock. Eggs. Cereal and dairy products that are fortified. This means that vitamin B12 has been added to the food. Check the label on the package to see if the food is fortified. The items listed above may not be a complete list of recommended foods and beverages. Contact a dietitian for more information. General instructions Get any injections that are prescribed by your health care provider. Take supplements only as told by your health care provider. Follow the directions carefully. Do not drink alcohol if your health care provider tells you not to. In some cases, you may only be asked to limit alcohol use. Keep all follow-up visits as told by your health care provider. This is important. Contact a health care provider if: Your symptoms come back. Get help right away if you: Develop shortness of breath. Have a rapid heart rate. Have chest pain. Become dizzy or lose consciousness. Summary Vitamin B12 deficiency occurs when the body does not have enough vitamin B12. The main causes of vitamin B12 deficiency include dietary deficiency, digestive diseases,  pernicious anemia, and having a surgery in which part of the stomach or small intestine is removed. In some cases, there are no symptoms of this condition. If the condition leads to anemia or nerve damage, various symptoms can occur, such as weakness, shortness of breath, and numbness. Treatment may include getting  vitamin B12 injections or taking vitamin B12 supplements. Eat lots of healthy foods that contain vitamin B12. This information is not intended to replace advice given to you by your health care provider. Make sure you discuss any questions you have with your health care provider. Document Revised: 06/09/2018 Document Reviewed: 08/30/2017 Elsevier Patient Education  2020 [employer].

## 2022-07-06 ENCOUNTER — Other Ambulatory Visit (HOSPITAL_COMMUNITY): Payer: Medicare Other

## 2022-07-10 ENCOUNTER — Other Ambulatory Visit: Payer: Self-pay | Admitting: Internal Medicine

## 2022-07-12 ENCOUNTER — Other Ambulatory Visit: Payer: Self-pay

## 2022-07-13 ENCOUNTER — Ambulatory Visit (HOSPITAL_COMMUNITY)
Admission: RE | Admit: 2022-07-13 | Discharge: 2022-07-13 | Disposition: A | Discharge status: Home / Self Care | Payer: Medicare Other | Source: Ambulatory Visit | Attending: Physician Assistant | Admitting: Physician Assistant

## 2022-07-13 DIAGNOSIS — R1319 Other dysphagia: Secondary | ICD-10-CM | POA: Diagnosis not present

## 2022-07-13 DIAGNOSIS — R1310 Dysphagia, unspecified: Secondary | ICD-10-CM | POA: Diagnosis not present

## 2022-07-13 DIAGNOSIS — M19011 Primary osteoarthritis, right shoulder: Secondary | ICD-10-CM | POA: Insufficient documentation

## 2022-07-19 ENCOUNTER — Telehealth: Payer: Self-pay | Admitting: Internal Medicine

## 2022-07-19 DIAGNOSIS — M25512 Pain in left shoulder: Secondary | ICD-10-CM | POA: Diagnosis not present

## 2022-07-19 DIAGNOSIS — M19011 Primary osteoarthritis, right shoulder: Secondary | ICD-10-CM | POA: Diagnosis not present

## 2022-07-19 DIAGNOSIS — M19012 Primary osteoarthritis, left shoulder: Secondary | ICD-10-CM | POA: Diagnosis not present

## 2022-07-19 NOTE — Telephone Encounter (Signed)
Spoke with Dr Francena Hanly - he is very concerned about general debility due to orthopedic issues, has no surgical indication, but high risk for fall and injury  Last OV here jan 2024 -   Please ask pt to make ROV as may need HH with PT, wheelchair or other

## 2022-07-19 NOTE — Telephone Encounter (Signed)
Pt has made appt for 07/21/22.Phillip KitchenShearon Shelton

## 2022-07-20 DIAGNOSIS — L821 Other seborrheic keratosis: Secondary | ICD-10-CM | POA: Diagnosis not present

## 2022-07-20 DIAGNOSIS — L814 Other melanin hyperpigmentation: Secondary | ICD-10-CM | POA: Diagnosis not present

## 2022-07-20 DIAGNOSIS — L309 Dermatitis, unspecified: Secondary | ICD-10-CM | POA: Diagnosis not present

## 2022-07-20 DIAGNOSIS — D1801 Hemangioma of skin and subcutaneous tissue: Secondary | ICD-10-CM | POA: Diagnosis not present

## 2022-07-20 DIAGNOSIS — Z85828 Personal history of other malignant neoplasm of skin: Secondary | ICD-10-CM | POA: Diagnosis not present

## 2022-07-20 DIAGNOSIS — L570 Actinic keratosis: Secondary | ICD-10-CM | POA: Diagnosis not present

## 2022-07-21 ENCOUNTER — Ambulatory Visit (INDEPENDENT_AMBULATORY_CARE_PROVIDER_SITE_OTHER): Payer: Medicare Other | Admitting: Internal Medicine

## 2022-07-21 VITALS — BP 124/70 | HR 97 | Temp 98.1°F | Ht 72.0 in | Wt 175.0 lb

## 2022-07-21 DIAGNOSIS — R269 Unspecified abnormalities of gait and mobility: Secondary | ICD-10-CM | POA: Diagnosis not present

## 2022-07-21 DIAGNOSIS — E559 Vitamin D deficiency, unspecified: Secondary | ICD-10-CM | POA: Diagnosis not present

## 2022-07-21 DIAGNOSIS — E7800 Pure hypercholesterolemia, unspecified: Secondary | ICD-10-CM

## 2022-07-21 DIAGNOSIS — M159 Polyosteoarthritis, unspecified: Secondary | ICD-10-CM

## 2022-07-21 DIAGNOSIS — F32A Depression, unspecified: Secondary | ICD-10-CM

## 2022-07-21 NOTE — Assessment & Plan Note (Signed)
Stable off mobic, for tylenol prn,  to f/u any worsening symptoms or concerns

## 2022-07-21 NOTE — Patient Instructions (Signed)
Please continue all other medications as before, and refills have been done if requested.  Please have the pharmacy call with any other refills you may need.  Please continue your efforts at being more active, low cholesterol diet, and weight control.  Please keep your appointments with your specialists as you may have planned  We can hold on further lab work today  Please make an Appointment to return in 6 months, or sooner if needed

## 2022-07-21 NOTE — Progress Notes (Signed)
Patient ID: Phillip Shelton, male   DOB: 04/03/1934, 88 y.o.   MRN: [id number]        Chief Complaint: follow up depression, gait d/o, OA, hld, low vit d       HPI:  Phillip Shelton is a 88 y.o. male here to f/u - Had dental appt earlier this wk, also cortisone to both shoulders with improved pain, but still takes tylenol fo other OA.  Had recent 7/9 barium esophogram neg for malignancy, asked to stop mobic, also for OTC B12 1000 mcg every day.  Has lost wt with less calories, just has lesser appetite recently for unclear reason, though also with more stress as wife now more infirm with recent falls and unable to come to her own appt today.  He himself has also been less active now weaker and feels more unsteady, more risk of falling, though has not done so himself.  Pt denies chest pain, increased sob or doe, wheezing, orthopnea, PND, increased LE swelling, palpitations, dizziness or syncope.   Pt denies polydipsia, polyuria, or new focal neuro s/s.   Denies worsening depressive symptoms, suicidal ideation, or panic Wt Readings from Last 3 Encounters:  07/21/22 175 lb (79.4 kg)  06/17/22 176 lb 6 oz (80 kg)  01/20/22 184 lb (83.5 kg)   BP Readings from Last 3 Encounters:  07/21/22 124/70  06/17/22 114/62  01/20/22 132/78         Past Medical History:  Diagnosis Date   Arthritis    Blood in stool 02/12/2010   Qualifier: Diagnosis of  By: Misty Stanley CMA (AAMA), Amanda     BPH (benign prostatic hyperplasia) 01/18/2020   Complication of anesthesia    Diverticulosis    Esophageal stricture    GERD 02/12/2010   Qualifier: Diagnosis of  By: Misty Stanley CMA (AAMA), Marchelle Folks     GERD (gastroesophageal reflux disease)    Hemorrhoids    Hiatal hernia    Idiopathic Charcot's joint of foot 12/27/2012   Mallory - Weiss tear 1981   OAB (overactive bladder) 12/27/2012   Osteoarthritis of right hip 09/19/2014   PONV (postoperative nausea and vomiting)    Pure hypercholesterolemia 12/27/2012   Past Surgical  History:  Procedure Laterality Date   CHOLECYSTECTOMY     COLONOSCOPY     GASTROCNEMIUS RECESSION  02/03/2011   Procedure: GASTROCNEMIUS SLIDE;  Surgeon: Sherri Rad, MD;  Location: [facility];  Service: Orthopedics;  Laterality: Right;   HAND SURGERY  jan 2016   rt hand   HEMORRHOID SURGERY  1990   TOTAL HIP ARTHROPLASTY Right 09/19/2014   Procedure: RIGHT TOTAL HIP ARTHROPLASTY ANTERIOR APPROACH;  Surgeon: Samson Frederic, MD;  Location: [facility];  Service: Orthopedics;  Laterality: Right;   UMBILICAL HERNIA REPAIR     UPPER GASTROINTESTINAL ENDOSCOPY      reports that he quit smoking about 42 years ago. His smoking use included cigarettes. He has never used smokeless tobacco. He reports current alcohol use. He reports that he does not use drugs. family history includes Arthritis in his father; Colon cancer in his maternal uncle; Hypertension in his father. Allergies  Allergen Reactions   Lipitor [Atorvastatin] Other (See Comments)    Just feels bad and cant sleep   Current Outpatient Medications on File Prior to Visit  Medication Sig Dispense Refill   Acetaminophen (TYLENOL 8 HOUR ARTHRITIS PAIN PO) Take by mouth.     cyanocobalamin (VITAMIN B12) 1000 MCG tablet Take 1,000 mcg by mouth  daily.     finasteride (PROSCAR) 5 MG tablet Take 1 tablet (5 mg total) by mouth daily. 90 tablet 3   fish oil-omega-3 fatty acids 1000 MG capsule Take 1 g by mouth at bedtime.      Multiple Vitamins-Minerals (MULTIVITAMIN ADULTS PO) Take 1 tablet by mouth daily.     omeprazole (PRILOSEC) 20 MG capsule Take 1 capsule (20 mg total) by mouth daily. 90 capsule 2   silodosin (RAPAFLO) 8 MG CAPS capsule TAKE 1 CAPSULE BY MOUTH EVERYDAY AT BEDTIME 90 capsule 3   temazepam (RESTORIL) 15 MG capsule TAKE 1 CAPSULE BY MOUTH AT BEDTIME AS NEEDED FOR SLEEP 30 capsule 2   No current facility-administered medications on file prior to visit.        ROS:  All others reviewed and negative.  Objective         PE:  BP 124/70 (BP Location: Left Arm, Patient Position: Sitting, Cuff Size: Normal)   Pulse 97   Temp 98.1 F (36.7 C) (Oral)   Ht 6' (1.829 m)   Wt 175 lb (79.4 kg)   SpO2 96%   BMI 23.73 kg/m                 Constitutional: Pt appears in NAD               HENT: Head: NCAT.                Right Ear: External ear normal.                 Left Ear: External ear normal.                Eyes: . Pupils are equal, round, and reactive to light. Conjunctivae and EOM are normal               Nose: without d/c or deformity               Neck: Neck supple. Gross normal ROM               Cardiovascular: Normal rate and regular rhythm.                 Pulmonary/Chest: Effort normal and breath sounds without rales or wheezing.                Abd:  Soft, NT, ND, + BS, no organomegaly               Neurological: Pt is alert. At baseline orientation, motor grossly intact               Skin: Skin is warm. No rashes, no other new lesions, LE edema - none               Psychiatric: Pt behavior is normal without agitation   Micro: none  Cardiac tracings I have personally interpreted today:  none  Pertinent Radiological findings (summarize): none   Lab Results  Component Value Date   WBC 8.1 01/20/2022   HGB 12.8 (L) 01/20/2022   HCT 37.4 (L) 01/20/2022   PLT 254.0 01/20/2022   GLUCOSE 106 (H) 01/20/2022   CHOL 182 01/20/2022   TRIG 68.0 01/20/2022   HDL 73.90 01/20/2022   LDLDIRECT 133.0 12/22/2012   LDLCALC 95 01/20/2022   ALT 9 01/20/2022   AST 13 01/20/2022   NA 135 01/20/2022   K 4.3 01/20/2022   CL 99 01/20/2022   CREATININE 0.84 01/20/2022  BUN 18 01/20/2022   CO2 27 01/20/2022   TSH 0.89 01/20/2022   INR 1.04 09/12/2014   HGBA1C 5.6 01/20/2022   MICROALBUR 3.3 (H) 12/26/2013   Assessment/Plan:  Phillip Shelton is a 88 y.o. White or Caucasian [1] male with  has a past medical history of Arthritis, Blood in stool (02/12/2010), BPH (benign prostatic hyperplasia)  (01/18/2020), Complication of anesthesia, Diverticulosis, Esophageal stricture, GERD (02/12/2010), GERD (gastroesophageal reflux disease), Hemorrhoids, Hiatal hernia, Idiopathic Charcot's joint of foot (12/27/2012), Mallory - Weiss tear (1981), OAB (overactive bladder) (12/27/2012), Osteoarthritis of right hip (09/19/2014), PONV (postoperative nausea and vomiting), and Pure hypercholesterolemia (12/27/2012).  Generalized osteoarthritis Stable off mobic, for tylenol prn,  to f/u any worsening symptoms or concerns  Depression Overall stable, declines need for change in tx for now, or referral to counsleing  Gait disorder Also for [facility] with PT  Pure hypercholesterolemia Lab Results  Component Value Date   LDLCALC 95 01/20/2022   uncontrolled, pt intolerant of lipitor, declines further statin or repatha for now, for lower chol diet  Vitamin D deficiency Last vitamin D Lab Results  Component Value Date   VD25OH 37.50 01/20/2022   Low to start oral replacement  Followup: Return in about 6 months (around 01/21/2023).  Oliver Barre, MD 07/24/2022 2:42 PM  Medical Group [facility]

## 2022-07-22 ENCOUNTER — Telehealth: Payer: Self-pay | Admitting: Internal Medicine

## 2022-07-22 NOTE — Telephone Encounter (Signed)
Pt daughter called needing letters type that the pt need help with mobility such as moving around in their home, bathing, cooking and cleaning. Pt daughter trying to get help and she need a letter type do so. Daughter also stated if you have any questions for her please reach out to her at [phone]   [facility]

## 2022-07-23 NOTE — Telephone Encounter (Signed)
Same answer as for wife, thanks

## 2022-07-23 NOTE — Telephone Encounter (Signed)
See same answer as Radio producer today, thanks

## 2022-07-24 ENCOUNTER — Encounter: Payer: Self-pay | Admitting: Internal Medicine

## 2022-07-24 NOTE — Assessment & Plan Note (Signed)
Lab Results  Component Value Date   LDLCALC 95 01/20/2022   uncontrolled, pt intolerant of lipitor, declines further statin or repatha for now, for lower chol diet

## 2022-07-24 NOTE — Assessment & Plan Note (Signed)
Overall stable, declines need for change in tx for now, or referral to counsleing

## 2022-07-24 NOTE — Assessment & Plan Note (Signed)
Also for HH with PT 

## 2022-07-24 NOTE — Assessment & Plan Note (Signed)
Last vitamin D Lab Results  Component Value Date   VD25OH 37.50 01/20/2022   Low to start oral replacement

## 2022-08-03 ENCOUNTER — Telehealth: Payer: Self-pay | Admitting: Internal Medicine

## 2022-08-03 NOTE — Telephone Encounter (Signed)
It seems to be too early for the temazepam as he should have 1-2 more refills after the last rx done jun 6  Also please let pt know that we received a phone call from his orthopedic who was concerned he would need a wheelchair for safety from falling  Is the pt ok for a script to give to [facility] regarding a manual wheelchair? Just let me know  thanks

## 2022-08-03 NOTE — Telephone Encounter (Signed)
Prescription Request  08/03/2022  LOV: 07/21/2022  What is the name of the medication or equipment? temazapam  Have you contacted your pharmacy to request a refill? Yes   Which pharmacy would you like this sent to?  [id number] - [place], [place] - [address] [place] [address] Phone: [phone] Fax: [phone]    Patient notified that their request is being sent to the clinical staff for review and that they should receive a response within 2 business days.   Please advise at Mobile [phone] (mobile)

## 2022-08-04 DIAGNOSIS — M170 Bilateral primary osteoarthritis of knee: Secondary | ICD-10-CM | POA: Diagnosis not present

## 2022-08-05 NOTE — Telephone Encounter (Signed)
Ok will hold on the wheelchair  Also still soon for temazepam b/c the rx from June 6 was for 1 month and 2 refills - so should not need refills prior to sept 6 or a few days before

## 2022-08-06 ENCOUNTER — Encounter (HOSPITAL_COMMUNITY): Payer: Self-pay

## 2022-08-06 ENCOUNTER — Inpatient Hospital Stay (HOSPITAL_COMMUNITY)
Admission: EM | Admit: 2022-08-06 | Discharge: 2022-08-14 | DRG: 309 | Disposition: A | Discharge status: Skilled Nursing Facility | Payer: Medicare Other | Attending: Internal Medicine | Admitting: Internal Medicine

## 2022-08-06 ENCOUNTER — Emergency Department (HOSPITAL_COMMUNITY): Payer: Medicare Other

## 2022-08-06 DIAGNOSIS — Z96641 Presence of right artificial hip joint: Secondary | ICD-10-CM | POA: Diagnosis present

## 2022-08-06 DIAGNOSIS — Z8249 Family history of ischemic heart disease and other diseases of the circulatory system: Secondary | ICD-10-CM | POA: Diagnosis not present

## 2022-08-06 DIAGNOSIS — D519 Vitamin B12 deficiency anemia, unspecified: Secondary | ICD-10-CM | POA: Diagnosis not present

## 2022-08-06 DIAGNOSIS — Z87891 Personal history of nicotine dependence: Secondary | ICD-10-CM | POA: Diagnosis not present

## 2022-08-06 DIAGNOSIS — R262 Difficulty in walking, not elsewhere classified: Secondary | ICD-10-CM

## 2022-08-06 DIAGNOSIS — T466X5A Adverse effect of antihyperlipidemic and antiarteriosclerotic drugs, initial encounter: Secondary | ICD-10-CM | POA: Diagnosis present

## 2022-08-06 DIAGNOSIS — R609 Edema, unspecified: Secondary | ICD-10-CM | POA: Diagnosis not present

## 2022-08-06 DIAGNOSIS — I482 Chronic atrial fibrillation, unspecified: Secondary | ICD-10-CM | POA: Diagnosis present

## 2022-08-06 DIAGNOSIS — R531 Weakness: Secondary | ICD-10-CM

## 2022-08-06 DIAGNOSIS — K219 Gastro-esophageal reflux disease without esophagitis: Secondary | ICD-10-CM | POA: Diagnosis not present

## 2022-08-06 DIAGNOSIS — F32A Depression, unspecified: Secondary | ICD-10-CM | POA: Diagnosis present

## 2022-08-06 DIAGNOSIS — Z888 Allergy status to other drugs, medicaments and biological substances status: Secondary | ICD-10-CM | POA: Diagnosis not present

## 2022-08-06 DIAGNOSIS — D529 Folate deficiency anemia, unspecified: Secondary | ICD-10-CM | POA: Diagnosis present

## 2022-08-06 DIAGNOSIS — I4891 Unspecified atrial fibrillation: Principal | ICD-10-CM

## 2022-08-06 DIAGNOSIS — Z8261 Family history of arthritis: Secondary | ICD-10-CM

## 2022-08-06 DIAGNOSIS — N400 Enlarged prostate without lower urinary tract symptoms: Secondary | ICD-10-CM | POA: Diagnosis present

## 2022-08-06 DIAGNOSIS — E871 Hypo-osmolality and hyponatremia: Secondary | ICD-10-CM | POA: Diagnosis present

## 2022-08-06 DIAGNOSIS — N3281 Overactive bladder: Secondary | ICD-10-CM | POA: Diagnosis present

## 2022-08-06 DIAGNOSIS — D649 Anemia, unspecified: Secondary | ICD-10-CM | POA: Diagnosis present

## 2022-08-06 DIAGNOSIS — Z79899 Other long term (current) drug therapy: Secondary | ICD-10-CM | POA: Diagnosis not present

## 2022-08-06 DIAGNOSIS — M1990 Unspecified osteoarthritis, unspecified site: Secondary | ICD-10-CM | POA: Diagnosis not present

## 2022-08-06 DIAGNOSIS — Z751 Person awaiting admission to adequate facility elsewhere: Secondary | ICD-10-CM

## 2022-08-06 DIAGNOSIS — G720 Drug-induced myopathy: Secondary | ICD-10-CM | POA: Diagnosis present

## 2022-08-06 DIAGNOSIS — E7800 Pure hypercholesterolemia, unspecified: Secondary | ICD-10-CM | POA: Diagnosis not present

## 2022-08-06 DIAGNOSIS — I672 Cerebral atherosclerosis: Secondary | ICD-10-CM | POA: Diagnosis not present

## 2022-08-06 DIAGNOSIS — I491 Atrial premature depolarization: Secondary | ICD-10-CM | POA: Diagnosis not present

## 2022-08-06 DIAGNOSIS — Z7901 Long term (current) use of anticoagulants: Secondary | ICD-10-CM | POA: Diagnosis not present

## 2022-08-06 LAB — URINALYSIS, ROUTINE W REFLEX MICROSCOPIC
Bilirubin Urine: NEGATIVE
Glucose, UA: NEGATIVE mg/dL
Hgb urine dipstick: NEGATIVE
Ketones, ur: NEGATIVE mg/dL
Leukocytes,Ua: NEGATIVE
Nitrite: NEGATIVE
Protein, ur: NEGATIVE mg/dL
Specific Gravity, Urine: 1.013 (ref 1.005–1.030)
pH: 5 (ref 5.0–8.0)

## 2022-08-06 LAB — IRON AND TIBC
Iron: 46 ug/dL (ref 45–182)
Saturation Ratios: 13 % — ABNORMAL LOW (ref 17.9–39.5)
TIBC: 347 ug/dL (ref 250–450)
UIBC: 301 ug/dL

## 2022-08-06 LAB — CBC WITH DIFFERENTIAL/PLATELET
Abs Immature Granulocytes: 0.04 10*3/uL (ref 0.00–0.07)
Basophils Absolute: 0 10*3/uL (ref 0.0–0.1)
Basophils Relative: 0 %
Eosinophils Absolute: 0 10*3/uL (ref 0.0–0.5)
Eosinophils Relative: 0 %
HCT: 35 % — ABNORMAL LOW (ref 39.0–52.0)
Hemoglobin: 11.6 g/dL — ABNORMAL LOW (ref 13.0–17.0)
Immature Granulocytes: 1 %
Lymphocytes Relative: 16 %
Lymphs Abs: 1.3 10*3/uL (ref 0.7–4.0)
MCH: 30.9 pg (ref 26.0–34.0)
MCHC: 33.1 g/dL (ref 30.0–36.0)
MCV: 93.3 fL (ref 80.0–100.0)
Monocytes Absolute: 1.1 10*3/uL — ABNORMAL HIGH (ref 0.1–1.0)
Monocytes Relative: 14 %
Neutro Abs: 5.8 10*3/uL (ref 1.7–7.7)
Neutrophils Relative %: 69 %
Platelets: 209 10*3/uL (ref 150–400)
RBC: 3.75 MIL/uL — ABNORMAL LOW (ref 4.22–5.81)
RDW: 13.9 % (ref 11.5–15.5)
WBC: 8.3 10*3/uL (ref 4.0–10.5)
nRBC: 0 % (ref 0.0–0.2)

## 2022-08-06 LAB — FERRITIN: Ferritin: 206 ng/mL (ref 24–336)

## 2022-08-06 LAB — COMPREHENSIVE METABOLIC PANEL
ALT: 16 U/L (ref 0–44)
AST: 18 U/L (ref 15–41)
Albumin: 3.6 g/dL (ref 3.5–5.0)
Alkaline Phosphatase: 57 U/L (ref 38–126)
Anion gap: 8 (ref 5–15)
BUN: 26 mg/dL — ABNORMAL HIGH (ref 8–23)
CO2: 23 mmol/L (ref 22–32)
Calcium: 8.3 mg/dL — ABNORMAL LOW (ref 8.9–10.3)
Chloride: 97 mmol/L — ABNORMAL LOW (ref 98–111)
Creatinine, Ser: 0.75 mg/dL (ref 0.61–1.24)
GFR, Estimated: >60 mL/min (ref >60)
Glucose, Bld: 101 mg/dL — ABNORMAL HIGH (ref 70–99)
Potassium: 4.1 mmol/L (ref 3.5–5.1)
Sodium: 128 mmol/L — ABNORMAL LOW (ref 135–145)
Total Bilirubin: 0.7 mg/dL (ref 0.3–1.2)
Total Protein: 6.5 g/dL (ref 6.5–8.1)

## 2022-08-06 LAB — BRAIN NATRIURETIC PEPTIDE: B Natriuretic Peptide: 128.6 pg/mL — ABNORMAL HIGH (ref 0.0–100.0)

## 2022-08-06 LAB — FOLATE: Folate: 15 ng/mL (ref >5.9)

## 2022-08-06 LAB — OSMOLALITY: Osmolality: 290 mOsm/kg (ref 275–295)

## 2022-08-06 LAB — PROTIME-INR
INR: 1.1 (ref 0.8–1.2)
Prothrombin Time: 14 seconds (ref 11.4–15.2)

## 2022-08-06 LAB — TSH: TSH: 1.279 u[IU]/mL (ref 0.350–4.500)

## 2022-08-06 LAB — VITAMIN B12: Vitamin B-12: 538 pg/mL (ref 180–914)

## 2022-08-06 LAB — SODIUM, URINE, RANDOM: Sodium, Ur: 71 mmol/L

## 2022-08-06 LAB — MAGNESIUM: Magnesium: 2 mg/dL (ref 1.7–2.4)

## 2022-08-06 LAB — OSMOLALITY, URINE: Osmolality, Ur: 526 mOsm/kg (ref 300–900)

## 2022-08-06 MED ORDER — APIXABAN 5 MG PO TABS
5.0000 mg | ORAL_TABLET | Freq: Two times a day (BID) | ORAL | Status: DC
  Administered 2022-08-06 – 2022-08-14 (×16): 5 mg via ORAL
  Filled 2022-08-06 (×16): qty 1

## 2022-08-06 MED ORDER — METOPROLOL TARTRATE 25 MG PO TABS
12.5000 mg | ORAL_TABLET | Freq: Two times a day (BID) | ORAL | Status: DC
  Administered 2022-08-06 – 2022-08-14 (×16): 12.5 mg via ORAL
  Filled 2022-08-06 (×16): qty 1

## 2022-08-06 MED ORDER — PANTOPRAZOLE SODIUM 40 MG PO TBEC
40.0000 mg | DELAYED_RELEASE_TABLET | Freq: Every day | ORAL | Status: DC
  Administered 2022-08-07 – 2022-08-14 (×8): 40 mg via ORAL
  Filled 2022-08-06 (×8): qty 1

## 2022-08-06 MED ORDER — FINASTERIDE 5 MG PO TABS
5.0000 mg | ORAL_TABLET | Freq: Every day | ORAL | Status: DC
  Administered 2022-08-07 – 2022-08-14 (×8): 5 mg via ORAL
  Filled 2022-08-06 (×8): qty 1

## 2022-08-06 MED ORDER — SODIUM CHLORIDE 0.9 % IV SOLN: 250.0000 mL | INTRAVENOUS | Status: DC | PRN

## 2022-08-06 MED ORDER — TEMAZEPAM 15 MG PO CAPS
15.0000 mg | ORAL_CAPSULE | Freq: Every evening | ORAL | Status: DC | PRN
  Administered 2022-08-06 – 2022-08-13 (×8): 15 mg via ORAL
  Filled 2022-08-06 (×8): qty 1

## 2022-08-06 MED ORDER — ACETAMINOPHEN 325 MG PO TABS
650.0000 mg | ORAL_TABLET | Freq: Four times a day (QID) | ORAL | Status: DC | PRN
  Administered 2022-08-06 – 2022-08-14 (×18): 650 mg via ORAL
  Filled 2022-08-06 (×19): qty 2

## 2022-08-06 MED ORDER — ACETAMINOPHEN 650 MG RE SUPP: 650.0000 mg | Freq: Four times a day (QID) | RECTAL | Status: DC | PRN

## 2022-08-06 MED ORDER — SODIUM CHLORIDE 0.9% FLUSH
3.0000 mL | Freq: Two times a day (BID) | INTRAVENOUS | Status: DC
  Administered 2022-08-07 – 2022-08-14 (×16): 3 mL via INTRAVENOUS

## 2022-08-06 MED ORDER — TAMSULOSIN HCL 0.4 MG PO CAPS
0.4000 mg | ORAL_CAPSULE | Freq: Every day | ORAL | Status: DC
  Administered 2022-08-06 – 2022-08-14 (×9): 0.4 mg via ORAL
  Filled 2022-08-06 (×9): qty 1

## 2022-08-06 MED ORDER — SODIUM CHLORIDE 0.9% FLUSH: 3.0000 mL | INTRAVENOUS | Status: DC | PRN

## 2022-08-06 NOTE — Assessment & Plan Note (Signed)
Statin myopathy.

## 2022-08-06 NOTE — ED Notes (Signed)
Pt ambulated with walker in hallway. Pt was steady. Pt states he normally does not use a walker. Dr. Earlene Plater notified

## 2022-08-06 NOTE — Assessment & Plan Note (Signed)
History of anemia, baseline 12-14 Check iron studies  B12/folate. Started on b12 back in January of 2024

## 2022-08-06 NOTE — ED Notes (Signed)
ED TO INPATIENT HANDOFF REPORT  ED Nurse Name and Phone #: Thamas Jaegers Name/Age/Gender Phillip Shelton 88 y.o. male Room/Bed: WA17/WA17  Code Status   Code Status: Full Code  Home/SNF/Other Home Patient oriented to: self, place, time, and situation Is this baseline? Yes   Triage Complete: Triage complete  Chief Complaint New onset atrial fibrillation [facility]) [I48.91]  Triage Note BIB [facility] from home c/o generalized weakness and fatigue upon waking this morning. He states usually when he wakes up he feels this way but typically resolves throughout the day. BLE swelling. Fluid removed off bilateral knees a few days ago. Wife was transported a few days ago to hospital and currently in the nursing home. Brother in route.   Allergies Allergies  Allergen Reactions   Lipitor [Atorvastatin] Other (See Comments)    Just feels bad and cant sleep    Level of Care/Admitting Diagnosis ED Disposition     ED Disposition  Admit   Condition  --   Comment  Hospital Area: [facility] [[id number]]  Level of Care: Telemetry [5]  Admit to tele based on following criteria: Complex arrhythmia (Bradycardia/Tachycardia)  May place patient in observation at [facility] or [facility] if equivalent level of care is available:: Yes  Covid Evaluation: Asymptomatic - no recent exposure (last 10 days) testing not required  Diagnosis: New onset atrial fibrillation [facility]) [[id number]]  Admitting Physician: Orland Mustard [[id number]]  Attending Physician: Alton Revere          B Medical/Surgery History Past Medical History:  Diagnosis Date   Arthritis    Blood in stool 02/12/2010   Qualifier: Diagnosis of  By: Misty Stanley CMA (AAMA), Amanda     BPH (benign prostatic hyperplasia) 01/18/2020   Complication of anesthesia    Diverticulosis    Esophageal stricture    GERD 02/12/2010   Qualifier: Diagnosis of  By: Misty Stanley CMA (AAMA), Marchelle Folks     GERD (gastroesophageal reflux disease)     Hemorrhoids    Hiatal hernia    Idiopathic Charcot's joint of foot 12/27/2012   Mallory - Weiss tear 1981   OAB (overactive bladder) 12/27/2012   Osteoarthritis of right hip 09/19/2014   PONV (postoperative nausea and vomiting)    Pure hypercholesterolemia 12/27/2012   Past Surgical History:  Procedure Laterality Date   CHOLECYSTECTOMY     COLONOSCOPY     GASTROCNEMIUS RECESSION  02/03/2011   Procedure: GASTROCNEMIUS SLIDE;  Surgeon: Sherri Rad, MD;  Location: [facility];  Service: Orthopedics;  Laterality: Right;   HAND SURGERY  jan 2016   rt hand   HEMORRHOID SURGERY  1990   TOTAL HIP ARTHROPLASTY Right 09/19/2014   Procedure: RIGHT TOTAL HIP ARTHROPLASTY ANTERIOR APPROACH;  Surgeon: Samson Frederic, MD;  Location: [facility];  Service: Orthopedics;  Laterality: Right;   UMBILICAL HERNIA REPAIR     UPPER GASTROINTESTINAL ENDOSCOPY       A IV Location/Drains/Wounds Patient Lines/Drains/Airways Status     Active Line/Drains/Airways     Name Placement date Placement time Site Days   Peripheral IV 08/06/22 18 G Anterior;Proximal;Right Forearm 08/06/22  1451  Forearm  less than 1   AIRWAYS 09/19/14  1208  -- 2878            Intake/Output Last 24 hours No intake or output data in the 24 hours ending 08/06/22 1751  Labs/Imaging Results for orders placed or performed during the hospital encounter of 08/06/22 (from the past 48 hour(s))  Urinalysis, Routine w reflex microscopic -Urine, Clean Catch     Status: None   Collection Time: 08/06/22  3:25 PM  Result Value Ref Range   Color, Urine YELLOW YELLOW   APPearance CLEAR CLEAR   Specific Gravity, Urine 1.013 1.005 - 1.030   pH 5.0 5.0 - 8.0   Glucose, UA NEGATIVE NEGATIVE mg/dL   Hgb urine dipstick NEGATIVE NEGATIVE   Bilirubin Urine NEGATIVE NEGATIVE   Ketones, ur NEGATIVE NEGATIVE mg/dL   Protein, ur NEGATIVE NEGATIVE mg/dL   Nitrite NEGATIVE NEGATIVE   Leukocytes,Ua NEGATIVE NEGATIVE    Comment:  Performed at [facility], [address], [place], [place] [address]  Comprehensive metabolic panel     Status: Abnormal   Collection Time: 08/06/22  4:00 PM  Result Value Ref Range   Sodium 128 (L) 135 - 145 mmol/L   Potassium 4.1 3.5 - 5.1 mmol/L   Chloride 97 (L) 98 - 111 mmol/L   CO2 23 22 - 32 mmol/L   Glucose, Bld 101 (H) 70 - 99 mg/dL    Comment: Glucose reference range applies only to samples taken after fasting for at least 8 hours.   BUN 26 (H) 8 - 23 mg/dL   Creatinine, Ser 5.62 0.61 - 1.24 mg/dL   Calcium 8.3 (L) 8.9 - 10.3 mg/dL   Total Protein 6.5 6.5 - 8.1 g/dL   Albumin 3.6 3.5 - 5.0 g/dL   AST 18 15 - 41 U/L   ALT 16 0 - 44 U/L   Alkaline Phosphatase 57 38 - 126 U/L   Total Bilirubin 0.7 0.3 - 1.2 mg/dL   GFR, Estimated >13 >08 mL/min    Comment: (NOTE) Calculated using the CKD-EPI Creatinine Equation (2021)    Anion gap 8 5 - 15    Comment: Performed at [facility], [address] [place], [place] [address]  CBC with Differential     Status: Abnormal   Collection Time: 08/06/22  4:00 PM  Result Value Ref Range   WBC 8.3 4.0 - 10.5 K/uL   RBC 3.75 (L) 4.22 - 5.81 MIL/uL   Hemoglobin 11.6 (L) 13.0 - 17.0 g/dL   HCT 69.6 (L) 29.5 - 28.4 %   MCV 93.3 80.0 - 100.0 fL   MCH 30.9 26.0 - 34.0 pg   MCHC 33.1 30.0 - 36.0 g/dL   RDW 13.2 44.0 - 10.2 %   Platelets 209 150 - 400 K/uL   nRBC 0.0 0.0 - 0.2 %   Neutrophils Relative % 69 %   Neutro Abs 5.8 1.7 - 7.7 K/uL   Lymphocytes Relative 16 %   Lymphs Abs 1.3 0.7 - 4.0 K/uL   Monocytes Relative 14 %   Monocytes Absolute 1.1 (H) 0.1 - 1.0 K/uL   Eosinophils Relative 0 %   Eosinophils Absolute 0.0 0.0 - 0.5 K/uL   Basophils Relative 0 %   Basophils Absolute 0.0 0.0 - 0.1 K/uL   Immature Granulocytes 1 %   Abs Immature Granulocytes 0.04 0.00 - 0.07 K/uL    Comment: Performed at [facility], [address] [place], [place] [address]  Magnesium      Status: None   Collection Time: 08/06/22  4:00 PM  Result Value Ref Range   Magnesium 2.0 1.7 - 2.4 mg/dL    Comment: Performed at [facility], [address] [place], [place] [address]   CT Head Wo Contrast  Result Date: 08/06/2022 CLINICAL DATA:  Gait issue.  Weakness and fatigue. EXAM: CT HEAD WITHOUT CONTRAST TECHNIQUE: Contiguous axial images were obtained from the base of the skull through the vertex without intravenous contrast. RADIATION DOSE REDUCTION: This exam was performed according to the departmental dose-optimization program which includes automated exposure control, adjustment of the mA and/or kV according to patient size and/or use of iterative reconstruction technique. COMPARISON:  None Available. FINDINGS: Brain: No intracranial hemorrhage, mass effect, or midline shift. Age related atrophy and chronic small vessel ischemia. No hydrocephalus. The basilar cisterns are patent. No evidence of territorial infarct or acute ischemia. Bilateral basal gangliar mineralization is typically senescent and of no clinical significance. No extra-axial or intracranial fluid collection. Vascular: Atherosclerosis of skullbase vasculature without hyperdense vessel or abnormal calcification. Skull: No skull fracture. Scattered arachnoid granulations, including large granulation in the right occipital bone. Sinuses/Orbits: Paranasal sinuses and mastoid air cells are clear. The visualized orbits are unremarkable. Other: None. IMPRESSION: 1. No acute intracranial abnormality. 2. Age related atrophy and chronic small vessel ischemia. Electronically Signed   By: Narda Rutherford M.D.   On: 08/06/2022 16:12   DG Chest 2 View  Result Date: 08/06/2022 CLINICAL DATA:  Weakness. EXAM: CHEST - 2 VIEW COMPARISON:  May 28, 2009. FINDINGS: The heart size and mediastinal contours are within normal limits. Both lungs are clear. Degenerative changes are seen involving the glenohumeral joints  bilaterally. IMPRESSION: No active cardiopulmonary disease. Electronically Signed   By: Lupita Raider M.D.   On: 08/06/2022 15:55    Pending Labs Unresulted Labs (From admission, onward)     Start     Ordered   08/07/22 0500  Basic metabolic panel  Tomorrow morning,   R        08/06/22 1745   08/07/22 0500  CBC  Tomorrow morning,   R        08/06/22 1745   08/06/22 1752  Osmolality  Once,   R        08/06/22 1751   08/06/22 1752  Sodium, urine, random  Once,   R        08/06/22 1751   08/06/22 1752  Osmolality, urine  Once,   R        08/06/22 1751   08/06/22 1745  Protime-INR  Once,   R        08/06/22 1745   08/06/22 1707  Brain natriuretic peptide  Once,   URGENT        08/06/22 1706   08/06/22 1525  TSH  Once,   URGENT        08/06/22 1525            Vitals/Pain Today's Vitals   08/06/22 1500 08/06/22 1515 08/06/22 1530 08/06/22 1600  BP: 118/71 128/89 139/75 134/80  Pulse: 82 86 90 84  Resp: 14 19 20 19   Temp:      TempSrc:      SpO2: 97% 98% 99% 98%  PainSc:        Isolation Precautions No active isolations  Medications Medications  apixaban (ELIQUIS) tablet 5 mg (has no administration in time range)  metoprolol tartrate (LOPRESSOR) tablet 12.5 mg (has no administration in time range)  sodium chloride flush (NS) 0.9 % injection 3 mL (has no administration in time range)  sodium chloride flush (NS) 0.9 % injection 3 mL (has no administration in time range)  0.9 %  sodium chloride infusion (has no administration in time range)  acetaminophen (TYLENOL) tablet 650 mg (  has no administration in time range)    Or  acetaminophen (TYLENOL) suppository 650 mg (has no administration in time range)    Mobility walks with device; normally walks without a device, but has cane and walker at home if he needs it. Has walked with a device while here this stay     Focused Assessments     R Recommendations: See Admitting Provider Note  Report given to:    Additional Notes:

## 2022-08-06 NOTE — Assessment & Plan Note (Signed)
Multifactorial with #1 and 2 contributing Also generalized OA especially in bilateral knees. PT/OT

## 2022-08-06 NOTE — ED Provider Notes (Signed)
[place] EMERGENCY DEPARTMENT AT [facility] Provider Note   CSN: [id number] Arrival date & time: 08/06/22  1441     History  Chief Complaint  Patient presents with   Weakness    Phillip Shelton is a 88 y.o. male.   Weakness 88 year old male history of GERD, hyperlipidemia, BPH presenting for generalized weakness.  Patient is here with his brother and daughter-in-law.  Patient states he has had a few weeks of gradually worsening generalized weakness.  Usually he is tired and weak in the morning but is able to drink coffee, eat breakfast and then move better.  However today he felt generalized weakness and felt very off balance when he was trying to stand up.  This did not improve normal so he presents for evaluation.  He had no focal weakness, numbness, vision changes.  No headache.  No falls or trauma.  He has not had any chest pain, abdominal pain, shortness of breath.  No fevers or chills.     Home Medications Prior to Admission medications   Medication Sig Start Date End Date Taking? Authorizing Provider  Acetaminophen (TYLENOL 8 HOUR ARTHRITIS PAIN PO) Take by mouth.    [provider]  cyanocobalamin (VITAMIN B12) 1000 MCG tablet Take 1,000 mcg by mouth daily.    [provider]  finasteride (PROSCAR) 5 MG tablet Take 1 tablet (5 mg total) by mouth daily. 12/17/21   Corwin Levins, MD  fish oil-omega-3 fatty acids 1000 MG capsule Take 1 g by mouth at bedtime.     [provider]  Multiple Vitamins-Minerals (MULTIVITAMIN ADULTS PO) Take 1 tablet by mouth daily.    [provider]  omeprazole (PRILOSEC) 20 MG capsule Take 1 capsule (20 mg total) by mouth daily. 06/17/22   Doree Albee, PA-C  silodosin (RAPAFLO) 8 MG CAPS capsule TAKE 1 CAPSULE BY MOUTH EVERYDAY AT BEDTIME 09/15/21   Corwin Levins, MD  temazepam (RESTORIL) 15 MG capsule TAKE 1 CAPSULE BY MOUTH AT BEDTIME AS NEEDED FOR SLEEP 06/10/22   Corwin Levins, MD       Allergies    Lipitor [atorvastatin]    Review of Systems   Review of Systems  Neurological:  Positive for weakness.  Review of systems completed and notable as per HPI.  ROS otherwise negative.  Physical Exam Updated Vital Signs BP 134/80   Pulse 84   Temp 98.3 F (36.8 C) (Oral)   Resp 19   SpO2 98%  Physical Exam Vitals and nursing note reviewed.  Constitutional:      General: He is not in acute distress.    Appearance: He is well-developed.  HENT:     Head: Normocephalic and atraumatic.     Nose: Nose normal.     Mouth/Throat:     Mouth: Mucous membranes are moist.     Pharynx: Oropharynx is clear.  Eyes:     Extraocular Movements: Extraocular movements intact.     Conjunctiva/sclera: Conjunctivae normal.     Pupils: Pupils are equal, round, and reactive to light.  Cardiovascular:     Rate and Rhythm: Normal rate and regular rhythm.     Heart sounds: No murmur heard. Pulmonary:     Effort: Pulmonary effort is normal. No respiratory distress.     Breath sounds: Normal breath sounds.  Abdominal:     Palpations: Abdomen is soft.     Tenderness: There is no abdominal tenderness. There is no guarding or rebound.  Musculoskeletal:        General: No swelling.     Cervical back: Normal range of motion and neck supple. No rigidity or tenderness.     Right lower leg: Edema present.     Left lower leg: Edema present.     Comments: Mild lower extremity edema.  Symmetric.  Skin:    General: Skin is warm and dry.     Capillary Refill: Capillary refill takes less than 2 seconds.  Neurological:     General: No focal deficit present.     Mental Status: He is alert and oriented to person, place, and time. Mental status is at baseline.     Cranial Nerves: No cranial nerve deficit.     Sensory: No sensory deficit.     Motor: No weakness.     Coordination: Coordination normal.     Comments: Patient has clear nerves II through XII intact.  No visual field deficits.  He has  good strength in all extremities.  Normal sensation all extremities.  No neglect.  Normal speech.  He is able to stand but is quite unsteady and not able to walk without assistance.  Psychiatric:        Mood and Affect: Mood normal.     ED Results / Procedures / Treatments   Labs (all labs ordered are listed, but only abnormal results are displayed) Labs Reviewed  COMPREHENSIVE METABOLIC PANEL - Abnormal; Notable for the following components:      Result Value   Sodium 128 (*)    Chloride 97 (*)    Glucose, Bld 101 (*)    BUN 26 (*)    Calcium 8.3 (*)    All other components within normal limits  CBC WITH DIFFERENTIAL/PLATELET - Abnormal; Notable for the following components:   RBC 3.75 (*)    Hemoglobin 11.6 (*)    HCT 35.0 (*)    Monocytes Absolute 1.1 (*)    All other components within normal limits  URINALYSIS, ROUTINE W REFLEX MICROSCOPIC  TSH  BRAIN NATRIURETIC PEPTIDE  MAGNESIUM    EKG EKG Interpretation Date/Time:  Friday August 06 2022 17:08:32 EDT Ventricular Rate:  99 PR Interval:    QRS Duration:  99 QT Interval:  346 QTC Calculation: 444 R Axis:   56  Text Interpretation: Atrial fibrillation Ventricular premature complex Minimal ST depression, inferior leads Confirmed by Fulton Reek [id number]) on 08/06/2022 5:33:17 PM  Radiology CT Head Wo Contrast  Result Date: 08/06/2022 CLINICAL DATA:  Gait issue.  Weakness and fatigue. EXAM: CT HEAD WITHOUT CONTRAST TECHNIQUE: Contiguous axial images were obtained from the base of the skull through the vertex without intravenous contrast. RADIATION DOSE REDUCTION: This exam was performed according to the departmental dose-optimization program which includes automated exposure control, adjustment of the mA and/or kV according to patient size and/or use of iterative reconstruction technique. COMPARISON:  None Available. FINDINGS: Brain: No intracranial hemorrhage, mass effect, or midline shift. Age related atrophy and chronic  small vessel ischemia. No hydrocephalus. The basilar cisterns are patent. No evidence of territorial infarct or acute ischemia. Bilateral basal gangliar mineralization is typically senescent and of no clinical significance. No extra-axial or intracranial fluid collection. Vascular: Atherosclerosis of skullbase vasculature without hyperdense vessel or abnormal calcification. Skull: No skull fracture. Scattered arachnoid granulations, including large granulation in the right occipital bone. Sinuses/Orbits: Paranasal sinuses and mastoid air cells are clear. The visualized orbits are unremarkable. Other: None. IMPRESSION: 1. No acute intracranial abnormality. 2. Age related atrophy  and chronic small vessel ischemia. Electronically Signed   By: Narda Rutherford M.D.   On: 08/06/2022 16:12   DG Chest 2 View  Result Date: 08/06/2022 CLINICAL DATA:  Weakness. EXAM: CHEST - 2 VIEW COMPARISON:  May 28, 2009. FINDINGS: The heart size and mediastinal contours are within normal limits. Both lungs are clear. Degenerative changes are seen involving the glenohumeral joints bilaterally. IMPRESSION: No active cardiopulmonary disease. Electronically Signed   By: Lupita Raider M.D.   On: 08/06/2022 15:55    Procedures Procedures    Medications Ordered in ED Medications - No data to display  ED Course/ Medical Decision Making/ A&P                                 Medical Decision Making Amount and/or Complexity of Data Reviewed Labs: ordered. Radiology: ordered.  Risk Decision regarding hospitalization.   Medical Decision Making:   Phillip Shelton is a 88 y.o. male who presented to the ED today with generalized weakness, difficulty ambulating.  Vital signs unremarkable.  On exam he is overall well-appearing.  He and his family describe more subacute process has been occurring over weeks with gradually declining mobility, strength.  His wife did recently moved to a nursing home which is probably not helping his  symptoms and he is no assistance throughout most of the day.  Here he does not have any focal deficits, low concern for stroke.  His weakness is generalized, seems less consistent with orthostasis.  No focal infectious symptoms.  He has no chest pain, EKG without signs of ischemia not consistent with ACS.  He does report some subacute changes to his balance, will obtain CT head to evaluate for mass, subacute stroke.  Will obtain workup for possible reversible cause of his symptoms.   Patient placed on continuous vitals and telemetry monitoring while in ED which was reviewed periodically.  Reviewed and confirmed nursing documentation for past medical history, family history, social history.  Initial Study Results:   Laboratory  All laboratory results reviewed.  Labs notable for new hyponatremia.  Mild anemia.  Urinalysis unremarkable.  EKG EKG was reviewed independently.  Appears to show atrial fibrillation, normal heart rate, normal intervals.  No acute ischemic changes.  Radiology:  All images reviewed independently.  Chest x-ray, CT head unremarkable.  Agree with radiology report at this time.    Reassessment and Plan:   Patient remains stable.  Workup here is notable for EKG concerning for new onset A-fib.  Occasionally I think I can see a P wave, however on 3 different EKGs it appears to be irregular without clear consistent P waves concerning for new onset A-fib which could be contributing to his symptoms.  He also has new hyponatremia.  I do not see any values for him as low as this which could be contributing to his generalized weakness.  Given his difficulty with ambulation, lack of help at home and new onset A-fib and hyponatremia which could be contributing discussed the patient with the hospitalist and he was admitted for further management.  Patient's presentation is most consistent with acute complicated illness / injury requiring diagnostic workup.           Final Clinical  Impression(s) / ED Diagnoses Final diagnoses:  Generalized weakness  Hyponatremia    Rx / DC Orders ED Discharge Orders     None  Laurence Spates, MD 08/06/22 [phone]

## 2022-08-06 NOTE — Assessment & Plan Note (Signed)
Likely multifactorial as brother states he has been declining over past 6 months, but more acutely over past month New atrial fibrillation could be playing a role Hyponatremia, although mild Depression with wife moving out of  home to SNF and now alone  ? New CHF, work up pending  PT/OT eval SW as family does not think he can return to his home alone and may need  higher level of care

## 2022-08-06 NOTE — Assessment & Plan Note (Signed)
Sodium 128, history of hyponatremia in past but last sodium was 135 in January of 2024 ? If due to hypervolemia hyponatremia with new atrial fibrillation  No exogenous losses and states eating and drinking well  Will check urine studies, monitor I/O F/u on bnp/echo and trend  Check TSH/plama osmolality

## 2022-08-06 NOTE — ED Triage Notes (Signed)
BIB [facility] from home c/o generalized weakness and fatigue upon waking this morning. He states usually when he wakes up he feels this way but typically resolves throughout the day. BLE swelling. Fluid removed off bilateral knees a few days ago. Wife was transported a few days ago to hospital and currently in the nursing home. Brother in route.

## 2022-08-06 NOTE — Assessment & Plan Note (Addendum)
88 year old presenting with complaints of off balance/weakness and fatigue found to be in new atrial fibrillation.  -obs to telemetry  -rate controlled -unsure how long as he is asymptomatic -CHA2DS-VaSc of at least 2. Start eliquis at 5mg  BID. Discussed risks of DOAC and will need to assess fall risk  -check echo -TSH/magnesium pending  -start metoprolol 12.5mg  BID  -check BNP and f/u on echo to make sure no new CHF -atrial fib clinic referral

## 2022-08-06 NOTE — Assessment & Plan Note (Signed)
Continue PPI daily. 

## 2022-08-06 NOTE — H&P (Signed)
History and Physical    Patient: Phillip Shelton ZOX:[id number] DOB: Mar 24, 1934 DOA: 08/06/2022 DOS: the patient was seen and examined on 08/06/2022 PCP: Corwin Levins, MD  Patient coming from: Home - lives at home alone. His wife went to a SNF x 1 week. He uses a walker at home and at times a cane.    Chief Complaint: fatigue/weakness and off balance   HPI: Phillip Shelton is a 88 y.o. male with medical history significant of BPH, GERD, HLD, OAB, depression, insomnia who presented to ED with complaints of fatigue/weakness and feeling off balance . He called EMS today right after lunch because he was so weak he was scared he would fall and he is by himself. He states his balance has gradually gotten worse. He states he is typically weak in the morning, but after an hour or two he gets better, but today he remained weak and this concerned him so he called EMS. He states he has been feeling okay.  Denies any fever/chills, good PO intake.    He has been feeling good. Denies any fever/chills, vision changes/headaches, chest pain or palpitations, shortness of breath or cough, abdominal pain, N/V/D, dysuria. He has chronic LE edema which he states is at baseline. He denies any weight gain, has lost weight and denies any orthopnea.   He has generalized OA and had his knees drained about 3 days ago on 7/31 at [facility].   brother at bedside states around 6 months he started to have a decline, but it has been much more noticeable over the past month.   He does not smoke or drink alcohol.   ER Course:  vitals: afebrile, bp: 132/84, HR: 90, RR: 14, oxygen: 97%RA Pertinent labs: hgb: 11.6, sodium: 128, BUN: 26,  CXR: no acute finding CT head: no acute finding  In ED: [facility] asked to admit     Review of Systems: As mentioned in the history of present illness. All other systems reviewed and are negative. Past Medical History:  Diagnosis Date   Arthritis    Blood in stool 02/12/2010   Qualifier:  Diagnosis of  By: Misty Stanley CMA (AAMA), Amanda     BPH (benign prostatic hyperplasia) 01/18/2020   Complication of anesthesia    Diverticulosis    Esophageal stricture    GERD 02/12/2010   Qualifier: Diagnosis of  By: Misty Stanley CMA (AAMA), Marchelle Folks     GERD (gastroesophageal reflux disease)    Hemorrhoids    Hiatal hernia    Idiopathic Charcot's joint of foot 12/27/2012   Mallory - Weiss tear 1981   OAB (overactive bladder) 12/27/2012   Osteoarthritis of right hip 09/19/2014   PONV (postoperative nausea and vomiting)    Pure hypercholesterolemia 12/27/2012   Past Surgical History:  Procedure Laterality Date   CHOLECYSTECTOMY     COLONOSCOPY     GASTROCNEMIUS RECESSION  02/03/2011   Procedure: GASTROCNEMIUS SLIDE;  Surgeon: Sherri Rad, MD;  Location: [facility];  Service: Orthopedics;  Laterality: Right;   HAND SURGERY  jan 2016   rt hand   HEMORRHOID SURGERY  1990   TOTAL HIP ARTHROPLASTY Right 09/19/2014   Procedure: RIGHT TOTAL HIP ARTHROPLASTY ANTERIOR APPROACH;  Surgeon: Samson Frederic, MD;  Location: [facility];  Service: Orthopedics;  Laterality: Right;   UMBILICAL HERNIA REPAIR     UPPER GASTROINTESTINAL ENDOSCOPY     Social History:  reports that he quit smoking about 42 years ago. His smoking use included cigarettes.  He has never used smokeless tobacco. He reports current alcohol use. He reports that he does not use drugs.  Allergies  Allergen Reactions   Atorvastatin Other (See Comments)    Just feels bad and cant sleep  Other Reaction(s): Other (See Comments)    Family History  Problem Relation Age of Onset   Arthritis Father    Hypertension Father    Colon cancer Maternal Uncle    Diabetes Neg Hx    Heart disease Neg Hx    Stomach cancer Neg Hx    Rectal cancer Neg Hx     Prior to Admission medications   Medication Sig Start Date End Date Taking? Authorizing Provider  Acetaminophen (TYLENOL 8 HOUR ARTHRITIS PAIN PO) Take by mouth.    [provider]  cyanocobalamin (VITAMIN B12) 1000 MCG tablet Take 1,000 mcg by mouth daily.    [provider]  finasteride (PROSCAR) 5 MG tablet Take 1 tablet (5 mg total) by mouth daily. 12/17/21   Corwin Levins, MD  fish oil-omega-3 fatty acids 1000 MG capsule Take 1 g by mouth at bedtime.     [provider]  Multiple Vitamins-Minerals (MULTIVITAMIN ADULTS PO) Take 1 tablet by mouth daily.    [provider]  omeprazole (PRILOSEC) 20 MG capsule Take 1 capsule (20 mg total) by mouth daily. 06/17/22   Doree Albee, PA-C  silodosin (RAPAFLO) 8 MG CAPS capsule TAKE 1 CAPSULE BY MOUTH EVERYDAY AT BEDTIME 09/15/21   Corwin Levins, MD  temazepam (RESTORIL) 15 MG capsule TAKE 1 CAPSULE BY MOUTH AT BEDTIME AS NEEDED FOR SLEEP 06/10/22   Corwin Levins, MD    Physical Exam: Vitals:   08/06/22 1500 08/06/22 1515 08/06/22 1530 08/06/22 1600  BP: 118/71 128/89 139/75 134/80  Pulse: 82 86 90 84  Resp: 14 19 20 19   Temp:      TempSrc:      SpO2: 97% 98% 99% 98%   General:  Appears calm and comfortable and is in NAD Eyes:  PERRL, EOMI, normal lids, iris ENT:  grossly normal hearing, lips & tongue, mmm; appropriate dentition Neck:  no LAD, masses or thyromegaly; no carotid bruits Cardiovascular:  regularly irregular, no m/r/g. +BLE 2+ pitting edema  Respiratory:   CTA bilaterally with no wheezes/rales/rhonchi.  Normal respiratory effort. Abdomen:  soft, NT, ND, NABS Back:   normal alignment, no CVAT Skin:  no rash or induration seen on limited exam Musculoskeletal:  grossly normal tone BUE/BLE, good ROM, no bony abnormality Lower extremity:   Limited foot exam with no ulcerations.  2+ distal pulses. Psychiatric:  grossly normal mood and affect, speech fluent and appropriate, AOx3 Neurologic:  CN 2-12 grossly intact, moves all extremities in coordinated fashion, sensation intact   Radiological Exams on Admission: Independently reviewed - see discussion in A/P where  applicable  CT Head Wo Contrast  Result Date: 08/06/2022 CLINICAL DATA:  Gait issue.  Weakness and fatigue. EXAM: CT HEAD WITHOUT CONTRAST TECHNIQUE: Contiguous axial images were obtained from the base of the skull through the vertex without intravenous contrast. RADIATION DOSE REDUCTION: This exam was performed according to the departmental dose-optimization program which includes automated exposure control, adjustment of the mA and/or kV according to patient size and/or use of iterative reconstruction technique. COMPARISON:  None Available. FINDINGS: Brain: No intracranial hemorrhage, mass effect, or midline shift. Age related atrophy and chronic small vessel ischemia. No hydrocephalus. The basilar cisterns are patent. No evidence of territorial infarct or  acute ischemia. Bilateral basal gangliar mineralization is typically senescent and of no clinical significance. No extra-axial or intracranial fluid collection. Vascular: Atherosclerosis of skullbase vasculature without hyperdense vessel or abnormal calcification. Skull: No skull fracture. Scattered arachnoid granulations, including large granulation in the right occipital bone. Sinuses/Orbits: Paranasal sinuses and mastoid air cells are clear. The visualized orbits are unremarkable. Other: None. IMPRESSION: 1. No acute intracranial abnormality. 2. Age related atrophy and chronic small vessel ischemia. Electronically Signed   By: Narda Rutherford M.D.   On: 08/06/2022 16:12   DG Chest 2 View  Result Date: 08/06/2022 CLINICAL DATA:  Weakness. EXAM: CHEST - 2 VIEW COMPARISON:  May 28, 2009. FINDINGS: The heart size and mediastinal contours are within normal limits. Both lungs are clear. Degenerative changes are seen involving the glenohumeral joints bilaterally. IMPRESSION: No active cardiopulmonary disease. Electronically Signed   By: Lupita Raider M.D.   On: 08/06/2022 15:55    EKG: Independently reviewed.  Atrial fib with rate 99; nonspecific ST  changes with no evidence of acute ischemia. PVC. Afib new compared to EKG from 2020   EMS strip rate of 100 in atrial fib   Labs on Admission: I have personally reviewed the available labs and imaging studies at the time of the admission.  Pertinent labs:   hgb: 11.6,  sodium: 128, BUN: 26  Assessment and Plan: Principal Problem:   New onset atrial fibrillation (HCC) Active Problems:   Hyponatremia   Weakness   Ambulatory dysfunction   Normocytic anemia   GERD   BPH (benign prostatic hyperplasia)   Pure hypercholesterolemia    Assessment and Plan: * New onset atrial fibrillation (HCC) 88 year old presenting with complaints of off balance/weakness and fatigue found to be in new atrial fibrillation.  -obs to telemetry  -rate controlled -unsure how long as he is asymptomatic -CHA2DS-VaSc of at least 2. Start eliquis at 5mg  BID. Discussed risks of DOAC and will need to assess fall risk  -check echo -TSH/magnesium pending  -start metoprolol 12.5mg  BID  -check BNP and f/u on echo to make sure no new CHF -atrial fib clinic referral   Hyponatremia Sodium 128, history of hyponatremia in past but last sodium was 135 in January of 2024 ? If due to hypervolemia hyponatremia with new atrial fibrillation  No exogenous losses and states eating and drinking well  Will check urine studies, monitor I/O F/u on bnp/echo and trend  Check TSH/plama osmolality   Weakness Likely multifactorial as brother states he has been declining over past 6 months, but more acutely over past month New atrial fibrillation could be playing a role Hyponatremia, although mild Depression with wife moving out of  home to SNF and now alone  ? New CHF, work up pending  PT/OT eval SW as family does not think he can return to his home alone and may need  higher level of care   Ambulatory dysfunction Multifactorial with #1 and 2 contributing Also generalized OA especially in bilateral knees. PT/OT    Normocytic anemia History of anemia, baseline 12-14 Check iron studies  B12/folate. Started on b12 back in January of 2024   GERD Continue PPI daily   BPH (benign prostatic hyperplasia) Continue his rapaflo and proscar daily   Pure hypercholesterolemia Statin myopathy.     Advance Care Planning:   Code Status: Full Code  Consults: PT/OT/SW   DVT Prophylaxis: eliquis   Family Communication: brother, Phillip Shelton and step daughter Phillip Shelton at bedside   Severity of  Illness: The appropriate patient status for this patient is OBSERVATION. Observation status is judged to be reasonable and necessary in order to provide the required intensity of service to ensure the patient's safety. The patient's presenting symptoms, physical exam findings, and initial radiographic and laboratory data in the context of their medical condition is felt to place them at decreased risk for further clinical deterioration. Furthermore, it is anticipated that the patient will be medically stable for discharge from the hospital within 2 midnights of admission.   Author: Orland Mustard, MD 08/06/2022 5:59 PM  For on call review [email or web address].

## 2022-08-06 NOTE — Assessment & Plan Note (Signed)
Continue his rapaflo and proscar daily

## 2022-08-07 ENCOUNTER — Observation Stay (HOSPITAL_BASED_OUTPATIENT_CLINIC_OR_DEPARTMENT_OTHER): Payer: Medicare Other

## 2022-08-07 ENCOUNTER — Other Ambulatory Visit: Payer: Self-pay

## 2022-08-07 DIAGNOSIS — I4891 Unspecified atrial fibrillation: Secondary | ICD-10-CM

## 2022-08-07 MED ORDER — CARMEX CLASSIC LIP BALM EX OINT
TOPICAL_OINTMENT | CUTANEOUS | Status: DC | PRN
  Filled 2022-08-07: qty 10

## 2022-08-07 NOTE — Progress Notes (Signed)
PROGRESS NOTE    Phillip Shelton  QIO:[id number] DOB: [birth year] DOA: 08/06/2022 PCP: Phillip Shelton   Brief Narrative:  This 88 years old Male with PMH significant for BPH,  GERD, Hyperlipidemia, overactive bladder, depression, insomnia presented in the ED with complaints of generalized weakness / fatigue and off balance for 1 week.  He called EMS after lunch because he was so weak, he was scared that he would fall and he was by self.  He states his balance has gradually gotten worse. He has chronic lower extremity edema which he states is at baseline.  Patient's brother reports that he has progressive decline that is noticeable over last 1 month.  Workup in the ED reveals chest x-ray no acute findings.  CT head no acute findings.  Admitted for further evaluation.  Assessment & Plan:   Principal Problem:   New onset atrial fibrillation (HCC) Active Problems:   Hyponatremia   Weakness   Ambulatory dysfunction   Normocytic anemia   GERD   BPH (benign prostatic hyperplasia)   Pure hypercholesterolemia  New onset atrial fibrillation : Patient presented with generalized weakness, unsteadiness, fatigue. EKG showed A-fib with NVR. Heart rate is well-controlled.  Patient is started on Eliquis 5 mg twice daily. Obtain 2D echocardiogram. Continue metoprolol 12.5 mg twice daily, check BNP.  Hyponatremia: Patient has history of hyponatremia in the past but last sodium was 135. No exogenous losses and states eating and drinking well. Check TSH/plasma osmolality, BNP, echo.   Generalized Weakness: Likely multifactorial as brother states he has been declining over past 6 months. New onset atrial fibrillation could be playing a role. Patient also seems depressed , as wife moved out to nursing home last week and he is living alone.   PT and OT evaluation.  Ambulatory dysfunction: could be due to hyponatremia, new onset A-fib. 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 PT/OT  Evaluation.   Normocytic anemia: History of anemia, baseline 12-14 Check iron studies . B12/folate. Started on b12 back in January of 2024    GERD: Continue Pantoprazole 40 mg po daily.   BPH (benign prostatic hyperplasia) Continue his rapaflo and proscar daily.   Pure hypercholesterolemia Statin myopathy.     DVT prophylaxis: Eliquis. Code Status: Full code Family Communication: No family at bed side Disposition Plan:   Status is: Observation The patient remains OBS appropriate and will d/c before 2 midnights.   Admitted for new onset A-fib, now heart rate is controlled,  started on Eliquis.  Workup for CHF is in process.  Echo is pending   Consultants:  None  Procedures:Echo  Antimicrobials: Anti-infectives (From admission, onward)    None      Subjective: Patient was seen and examined at bedside.  Overnight events noted.   Patient seems deconditioned.  Heart rate is well-controlled.  He states he is feeling very weak and tired.  Objective: Vitals:   08/07/22 0045 08/07/22 0415 08/07/22 0943 08/07/22 1247  BP: 133/85 117/83 128/71 119/78  Pulse: 63 74 88 (!) 106  Resp: 20 16    Temp: (!) 97.5 F (36.4 C) 97.7 F (36.5 C) 98 F (36.7 C) (!) 97.4 F (36.3 C)  TempSrc: Oral Oral Oral Oral  SpO2: 97% 98% 95% 99%  Weight:      Height:        Intake/Output Summary (Last 24 hours) at 08/07/2022 1333 Last data filed at 08/07/2022 1011 Gross per 24 hour  Intake 960 ml  Output 810 ml  Net 150 ml   Filed Weights   08/06/22 2111  Weight: 77.6 kg    Examination:  General exam: Appears comfortable, deconditioned, not in any acute distress. Respiratory system: Clear to auscultation. Respiratory effort normal.  RR 16 Cardiovascular system: S1 & S2 heard, RRR. No JVD, murmurs, rubs, gallops or clicks.  Gastrointestinal system: Abdomen is non distended, soft and non tender. Normal bowel sounds heard. Central nervous system: Alert and oriented x3. No focal  neurological deficits. Extremities: No edema, no cyanosis, no clubbing Skin: No rashes, lesions or ulcers Psychiatry: Judgement and insight appear normal. Mood & affect appropriate.     Data Reviewed: I have personally reviewed following labs and imaging studies  CBC: Recent Labs  Lab 08/06/22 1600 08/07/22 0621  WBC 8.3 6.5  NEUTROABS 5.8  --   HGB 11.6* 11.2*  HCT 35.0* 33.0*  MCV 93.3 91.9  PLT 209 222   Basic Metabolic Panel: Recent Labs  Lab 08/06/22 1600 08/07/22 0621  NA 128* 131*  K 4.1 3.9  CL 97* 98  CO2 23 26  GLUCOSE 101* 93  BUN 26* 21  CREATININE 0.75 0.77  CALCIUM 8.3* 8.6*  MG 2.0  --    GFR: Estimated Creatinine Clearance: 74.1 mL/min (by C-G formula based on SCr of 0.77 mg/dL). Liver Function Tests: Recent Labs  Lab 08/06/22 1600  AST 18  ALT 16  ALKPHOS 57  BILITOT 0.7  PROT 6.5  ALBUMIN 3.6   No results for input(s): "LIPASE", "AMYLASE" in the last 168 hours. No results for input(s): "AMMONIA" in the last 168 hours. Coagulation Profile: Recent Labs  Lab 08/06/22 2051  INR 1.1   Cardiac Enzymes: No results for input(s): "CKTOTAL", "CKMB", "CKMBINDEX", "TROPONINI" in the last 168 hours. BNP (last 3 results) No results for input(s): "PROBNP" in the last 8760 hours. HbA1C: No results for input(s): "HGBA1C" in the last 72 hours. CBG: No results for input(s): "GLUCAP" in the last 168 hours. Lipid Profile: No results for input(s): "CHOL", "HDL", "LDLCALC", "TRIG", "CHOLHDL", "LDLDIRECT" in the last 72 hours. Thyroid Function Tests: Recent Labs    08/06/22 1600  TSH 1.279   Anemia Panel: Recent Labs    08/06/22 2051  VITAMINB12 538  FOLATE 15.0  FERRITIN 206  TIBC 347  IRON 46   Sepsis Labs: No results for input(s): "PROCALCITON", "LATICACIDVEN" in the last 168 hours.  No results found for this or any previous visit (from the past 240 hour(s)).       Radiology Studies: ECHOCARDIOGRAM COMPLETE  Result Date:  08/07/2022    ECHOCARDIOGRAM REPORT   Patient Name:   Phillip Shelton Date of Exam: 08/07/2022 Medical Rec #:  [id number]       Height:       74.0 in Accession #:    [id number]      Weight:       171.1 lb Date of Birth:  [birth year]      BSA:          2.034 m Patient Age:    87 years        BP:           117/83 mmHg Patient Gender: M               HR:           108 bpm. Exam Location:  Inpatient Procedure: 2D Echo, Cardiac Doppler and Color Doppler Indications:    Atrial Fibrillation  History:  Patient has prior history of Echocardiogram examinations, most                 recent 11/24/2018. Arrythmias:Atrial Fibrillation,                 Signs/Symptoms:Fatigue; Risk Factors:Former Smoker.  Sonographer:    Wallie Char Referring Phys: [id number] ALLISON WOLFE IMPRESSIONS  1. Left ventricular ejection fraction, by estimation, is 50 to 55%. The left ventricle has low normal function. The left ventricle has no regional wall motion abnormalities. Left ventricular diastolic function could not be evaluated.  2. Right ventricular systolic function is normal. The right ventricular size is mildly enlarged. There is normal pulmonary artery systolic pressure.  3. The mitral valve is normal in structure. Trivial mitral valve regurgitation. No evidence of mitral stenosis.  4. The aortic valve is tricuspid. Aortic valve regurgitation is not visualized. No aortic stenosis is present.  5. Aortic dilatation noted. There is mild dilatation of the ascending aorta, measuring 42 mm.  6. The inferior vena cava is dilated in size with <50% respiratory variability, suggesting right atrial pressure of 15 mmHg. Comparison(s): Changes from prior study are noted. LVEF is 50-55% now compared to 60-65% in 2020. FINDINGS  Left Ventricle: Left ventricular ejection fraction, by estimation, is 50 to 55%. The left ventricle has low normal function. The left ventricle has no regional wall motion abnormalities. The left ventricular internal cavity size  was normal in size. There is no left ventricular hypertrophy. Left ventricular diastolic function could not be evaluated due to atrial fibrillation. Left ventricular diastolic function could not be evaluated. Right Ventricle: The right ventricular size is mildly enlarged. No increase in right ventricular wall thickness. Right ventricular systolic function is normal. There is normal pulmonary artery systolic pressure. The tricuspid regurgitant velocity is 2.28  m/s, and with an assumed right atrial pressure of 15 mmHg, the estimated right ventricular systolic pressure is 35.8 mmHg. Left Atrium: Left atrial size was normal in size. Right Atrium: Right atrial size was normal in size. Pericardium: There is no evidence of pericardial effusion. Mitral Valve: The mitral valve is normal in structure. Trivial mitral valve regurgitation. No evidence of mitral valve stenosis. MV peak gradient, 1.9 mmHg. The mean mitral valve gradient is 1.0 mmHg. Tricuspid Valve: The tricuspid valve is grossly normal. Tricuspid valve regurgitation is trivial. No evidence of tricuspid stenosis. Aortic Valve: The aortic valve is tricuspid. Aortic valve regurgitation is not visualized. No aortic stenosis is present. Aortic valve mean gradient measures 2.0 mmHg. Aortic valve peak gradient measures 3.3 mmHg. Aortic valve area, by VTI measures 4.41 cm. Pulmonic Valve: The pulmonic valve was not well visualized. Pulmonic valve regurgitation is not visualized. No evidence of pulmonic stenosis. Aorta: The aortic root is normal in size and structure and aortic dilatation noted. There is mild dilatation of the ascending aorta, measuring 42 mm. Venous: The inferior vena cava is dilated in size with less than 50% respiratory variability, suggesting right atrial pressure of 15 mmHg. IAS/Shunts: No atrial level shunt detected by color flow Doppler.  LEFT VENTRICLE PLAX 2D LVIDd:         4.50 cm      Diastology LVIDs:         3.40 cm      LV e' medial:     10.20 cm/s LV PW:         1.00 cm      LV E/e' medial:  7.5 LV IVS:  1.00 cm      LV e' lateral:   12.40 cm/s LVOT diam:     2.50 cm      LV E/e' lateral: 6.1 LV SV:         74 LV SV Index:   37 LVOT Area:     4.91 cm  LV Volumes (MOD) LV vol d, MOD A2C: 75.4 ml LV vol d, MOD A4C: 108.0 ml LV vol s, MOD A2C: 33.9 ml LV vol s, MOD A4C: 48.4 ml LV SV MOD A2C:     41.5 ml LV SV MOD A4C:     108.0 ml LV SV MOD BP:      47.1 ml RIGHT VENTRICLE             IVC RV Basal diam:  4.20 cm     IVC diam: 2.50 cm RV S prime:     14.33 cm/s TAPSE (M-mode): 2.0 cm LEFT ATRIUM             Index        RIGHT ATRIUM           Index LA diam:        3.70 cm 1.82 cm/m   RA Area:     19.90 cm LA Vol (A2C):   63.2 ml 31.07 ml/m  RA Volume:   62.30 ml  30.63 ml/m LA Vol (A4C):   59.7 ml 29.35 ml/m LA Biplane Vol: 61.9 ml 30.43 ml/m  AORTIC VALVE AV Area (Vmax):    4.19 cm AV Area (Vmean):   4.22 cm AV Area (VTI):     4.41 cm AV Vmax:           90.58 cm/s AV Vmean:          65.650 cm/s AV VTI:            0.168 m AV Peak Grad:      3.3 mmHg AV Mean Grad:      2.0 mmHg LVOT Vmax:         77.38 cm/s LVOT Vmean:        56.475 cm/s LVOT VTI:          0.151 m LVOT/AV VTI ratio: 0.90  AORTA Ao Root diam: 3.70 cm Ao Asc diam:  4.20 cm MITRAL VALVE               TRICUSPID VALVE MV Area (PHT): 3.64 cm    TR Peak grad:   20.8 mmHg MV Area VTI:   5.01 cm    TR Vmax:        228.00 cm/s MV Peak grad:  1.9 mmHg MV Mean grad:  1.0 mmHg    SHUNTS MV Vmax:       0.69 m/s    Systemic VTI:  0.15 m MV Vmean:      39.5 cm/s   Systemic Diam: 2.50 cm MV Decel Time: 208 msec MV E velocity: 76.10 cm/s Vishnu Priya Mallipeddi Electronically signed by Winfield Rast Mallipeddi Signature Date/Time: 08/07/2022/12:21:07 PM    Final    CT Head Wo Contrast  Result Date: 08/06/2022 CLINICAL DATA:  Gait issue.  Weakness and fatigue. EXAM: CT HEAD WITHOUT CONTRAST TECHNIQUE: Contiguous axial images were obtained from the base of the skull through the vertex  without intravenous contrast. RADIATION DOSE REDUCTION: This exam was performed according to the departmental dose-optimization program which includes automated exposure control, adjustment of the mA and/or kV according to patient size and/or use  of iterative reconstruction technique. COMPARISON:  None Available. FINDINGS: Brain: No intracranial hemorrhage, mass effect, or midline shift. Age related atrophy and chronic small vessel ischemia. No hydrocephalus. The basilar cisterns are patent. No evidence of territorial infarct or acute ischemia. Bilateral basal gangliar mineralization is typically senescent and of no clinical significance. No extra-axial or intracranial fluid collection. Vascular: Atherosclerosis of skullbase vasculature without hyperdense vessel or abnormal calcification. Skull: No skull fracture. Scattered arachnoid granulations, including large granulation in the right occipital bone. Sinuses/Orbits: Paranasal sinuses and mastoid air cells are clear. The visualized orbits are unremarkable. Other: None. IMPRESSION: 1. No acute intracranial abnormality. 2. Age related atrophy and chronic small vessel ischemia. Electronically Signed   By: Narda Rutherford M.D.   On: 08/06/2022 16:12   DG Chest 2 View  Result Date: 08/06/2022 CLINICAL DATA:  Weakness. EXAM: CHEST - 2 VIEW COMPARISON:  May 28, 2009. FINDINGS: The heart size and mediastinal contours are within normal limits. Both lungs are clear. Degenerative changes are seen involving the glenohumeral joints bilaterally. IMPRESSION: No active cardiopulmonary disease. Electronically Signed   By: Lupita Raider M.D.   On: 08/06/2022 15:55    Scheduled Meds:  apixaban  5 mg Oral BID   finasteride  5 mg Oral Daily   metoprolol tartrate  12.5 mg Oral BID   pantoprazole  40 mg Oral Daily   sodium chloride flush  3 mL Intravenous Q12H   tamsulosin  0.4 mg Oral QPC supper   Continuous Infusions:  sodium chloride       LOS: 0 days    Time  spent: 50 mins    Willeen Niece, Shelton [facility]   If 7PM-7AM, please contact night-coverage

## 2022-08-07 NOTE — Evaluation (Signed)
Occupational Therapy Evaluation Patient Details Name: Phillip Shelton MRN: [id number] DOB: [birth year] Today's Date: 08/07/2022   History of Present Illness Pt is an 88yo male presnting to [facility] ED with complaints of fatigue, weakness, and feeling off balance; found to be in new Afib. Of note recent bilateral knee drainage 7/31  PMH: BPH, GERD, HLD, OAB, depression, insomnia, R-THA,cortisone shots to bilateral shoulders   Clinical Impression   Patient is a 88 year old male who was admitted for above. Patient was living at home while caring for wife. Patient was noted to have poor standing balance, decreased safety awareness, and decreased functional activity tolerance. Patient is not at level to transition home with limited caregiver support at this time. Patient will benefit from continued inpatient follow up therapy, <3 hours/day.Patient would continue to benefit from skilled OT services at this time while admitted and after d/c to address noted deficits in order to improve overall safety and independence in ADLs.        Recommendations for follow up therapy are one component of a multi-disciplinary discharge planning process, led by the attending physician.  Recommendations may be updated based on patient status, additional functional criteria and insurance authorization.   Assistance Recommended at Discharge Frequent or constant Supervision/Assistance  Patient can return home with the following A little help with walking and/or transfers;A lot of help with bathing/dressing/bathroom;Assistance with cooking/housework;Direct supervision/assist for medications management;Assist for transportation;Help with stairs or ramp for entrance;Direct supervision/assist for financial management    Functional Status Assessment  Patient has had a recent decline in their functional status and demonstrates the ability to make significant improvements in function in a reasonable and predictable amount of time.   Equipment Recommendations  None recommended by OT       Precautions / Restrictions Precautions Precautions: Fall Restrictions Weight Bearing Restrictions: No      Mobility Bed Mobility Overal bed mobility: Needs Assistance Bed Mobility: Supine to Sit     Supine to sit: Min guard     General bed mobility comments: with HOB raised and increased time.       Balance Overall balance assessment: Needs assistance   Sitting balance-Leahy Scale: Fair     Standing balance support: Bilateral upper extremity supported, Reliant on assistive device for balance Standing balance-Leahy Scale: Poor         ADL either performed or assessed with clinical judgement   ADL Overall ADL's : Needs assistance/impaired Eating/Feeding: Supervision/ safety;Sitting   Grooming: Wash/dry face;Sitting;Set up   Upper Body Bathing: Set up;Sitting   Lower Body Bathing: Minimal assistance;Sitting/lateral leans;Sit to/from stand Lower Body Bathing Details (indicate cue type and reason): patient is able to cross LLE over on lap but not RLE. Upper Body Dressing : Set up;Sitting   Lower Body Dressing: Minimal assistance;Min guard;Sitting/lateral leans;Sit to/from stand Lower Body Dressing Details (indicate cue type and reason): patient was able to don/doff socks and shoes sitting EOB with encouragement. patient reported " i dont think i can bend over" but denies dizziness and change in status  with change of positioning. Toilet Transfer: Minimal assistance;Ambulation;Rolling walker (2 wheels) Toilet Transfer Details (indicate cue type and reason): with increased time. patient reporting with standing static that BLE will buckle. Toileting- Clothing Manipulation and Hygiene: Moderate assistance;Sit to/from stand       Functional mobility during ADLs: Minimal assistance General ADL Comments: patient was min guard with RW and min A without RW. patient reported using RW all the time at home then when  asked how he carries items around teh house for meals, reported oh i dont use a walker at home i just carry the plate of food.      Pertinent Vitals/Pain Pain Assessment Pain Assessment: No/denies pain        Extremity/Trunk Assessment Upper Extremity Assessment Upper Extremity Assessment: LUE deficits/detail;RUE deficits/detail           Communication Communication Communication: HOH   Cognition Arousal/Alertness: Awake/alert Behavior During Therapy: WFL for tasks assessed/performed Overall Cognitive Status: Within Functional Limits for tasks assessed     General Comments: a little repetative at times.                Home Living Family/patient expects to be discharged to:: Private residence Living Arrangements: Alone Available Help at Discharge: Family Type of Home: House Home Access: Stairs to enter Secretary/administrator of Steps: 2   Home Layout: One level     Bathroom Shower/Tub: Walk-in shower         Home Equipment: Cane - single Librarian, academic (2 wheels);Grab bars - toilet;Grab bars - tub/shower;Shower seat   Additional Comments: 9-1 M-F caregiver to help with IADLs.      Prior Functioning/Environment Prior Level of Function : Independent/Modified Independent               ADLs Comments: patient reported wife is in SNF.        OT Problem List: Decreased activity tolerance;Impaired balance (sitting and/or standing);Decreased coordination;Decreased safety awareness;Decreased knowledge of use of DME or AE;Decreased knowledge of precautions;Cardiopulmonary status limiting activity      OT Treatment/Interventions: Self-care/ADL training;Energy conservation;Therapeutic exercise;DME and/or AE instruction;Therapeutic activities;Patient/family education;Balance training    OT Goals(Current goals can be found in the care plan section) Acute Rehab OT Goals Patient Stated Goal: to get better OT Goal Formulation: With patient Time For Goal  Achievement: 08/21/22 Potential to Achieve Goals: Fair  OT Frequency: Min 1X/week    Co-evaluation PT/OT/SLP Co-Evaluation/Treatment: Yes Reason for Co-Treatment: To address functional/ADL transfers PT goals addressed during session: Mobility/safety with mobility OT goals addressed during session: ADL's and self-care      AM-PAC OT "6 Clicks" Daily Activity     Outcome Measure Help from another person eating meals?: A Little Help from another person taking care of personal grooming?: A Little Help from another person toileting, which includes using toliet, bedpan, or urinal?: A Lot Help from another person bathing (including washing, rinsing, drying)?: A Lot Help from another person to put on and taking off regular upper body clothing?: A Little Help from another person to put on and taking off regular lower body clothing?: A Lot 6 Click Score: 15   End of Session Equipment Utilized During Treatment: Gait belt;Rolling walker (2 wheels) Nurse Communication: Other (comment) (concerns over patient transitioning home alone, IV dripping on L forearm)  Activity Tolerance: Patient tolerated treatment well Patient left: in chair;with call bell/phone within reach;with chair alarm set  OT Visit Diagnosis: Unsteadiness on feet (R26.81);Other abnormalities of gait and mobility (R26.89)                Time: 1610-9604 OT Time Calculation (min): 41 min Charges:  OT General Charges $OT Visit: 1 Visit OT Evaluation $OT Eval Moderate Complexity: 1 Mod OT Treatments $Self Care/Home Management : 8-22 mins  Rosalio Loud, MS Acute Rehabilitation Department Office# [phone]   Selinda Flavin 08/07/2022, 12:37 PM

## 2022-08-07 NOTE — Discharge Instructions (Signed)

## 2022-08-07 NOTE — Progress Notes (Signed)
PHYSICAL Therapy Evaluation     CLINICAL IMPRESSION Pt admitted with fear of falling and reported balance deficits, found to have new afib. Pt currently with functional limitations due to the deficits listed below (see PT Problem List). Of note, wife recently admitted to [facility], pt afraid of falling and inability to get up. Pt completed bed mobility with min guard (HOB elevated, cueing), transfers to RW with min guard, and ambulation min guard without LOB; emphasized use of RW during ambulation during acute stay and while at home, pt verbalized understanding. Pt will benefit from acute skilled PT to increase their independence and safety with mobility to allow discharge.      08/07/22 1300  PT Visit Information  Last PT Received On 08/07/22  Assistance Needed +1  PT/OT/SLP Co-Evaluation/Treatment Yes  Reason for Co-Treatment To address functional/ADL transfers  PT goals addressed during session Mobility/safety with mobility  OT goals addressed during session ADL's and self-care  History of Present Illness Pt is an 88yo male presnting to [facility] ED with complaints of fatigue, weakness, and feeling off balance; found to be in new Afib. Of note recent bilateral knee drainage 7/31. PMH: BPH, GERD, HLD, OAB, depression, insomnia, R-THA,cortisone shots to bilateral shoulders  Precautions  Precautions Fall  Precaution Comments Pt reports fear of falling.  Restrictions  Weight Bearing Restrictions No  Home Living  Family/patient expects to be discharged to: Private residence  Living Arrangements Alone  Available Help at Discharge Family  Type of Home House  Home Access Stairs to enter  Entrance Stairs-Number of Steps 2  Entrance Stairs-Rails Right;Left  Home Layout One level  Bathroom Shower/Tub Walk-in shower  Bathroom Toilet Handicapped height  Home Equipment Cane - single point;Rolling Walker (2 wheels);Grab bars - toilet;Grab bars - tub/shower;Shower seat  Additional Comments 9-1 M-F  caregiver to help with IADLs. patient reported wife is in SNF.  Prior Function  Prior Level of Function  Independent/Modified Independent  Mobility Comments Pt utilizes SPC at times, has not utilized RW, has not fallen but endorses fear of falling. Up until recently has been assisting wife with transfers and mobility.  ADLs Comments pt reproting he is able to complete most ADLs but aide performs iADLs  Communication  Communication HOH  Pain Assessment  Pain Assessment No/denies pain  Cognition  Arousal/Alertness Awake/alert  Behavior During Therapy WFL for tasks assessed/performed  Overall Cognitive Status Within Functional Limits for tasks assessed  Upper Extremity Assessment  Upper Extremity Assessment Defer to OT evaluation  Lower Extremity Assessment  Lower Extremity Assessment RLE deficits/detail;LLE deficits/detail  RLE Deficits / Details MMT hip/knees/ankles 3+/5, reporting no pain with knee F/E strength testing.  RLE Sensation WNL  LLE Deficits / Details MMT hip/knees/ankles 3+/5, reporting no pain with knee F/E strength testing.  LLE Sensation WNL  Cervical / Trunk Assessment  Cervical / Trunk Assessment Kyphotic  Bed Mobility  Overal bed mobility Needs Assistance  Bed Mobility Supine to Sit  Supine to sit Min guard  General bed mobility comments with HOB raised and increased time.  Transfers  Overall transfer level Needs assistance  Equipment used Rolling walker (2 wheels)  Transfers Sit to/from Stand  Sit to Stand Min guard  General transfer comment For safety only, no physical assist required or overt LOB noted  Ambulation/Gait  Ambulation/Gait assistance Min guard;+2 safety/equipment  Gait Distance (Feet) 100 Feet  Assistive device Rolling walker (2 wheels);None  Gait Pattern/deviations Step-through pattern;Decreased step length - left;Decreased step length - right;Knee flexed in  stance - left;Drifts right/left  General Gait Details Pt ambulated ~75ft with RW and  min guard, +2 for recliner follow for safety, no physical assist required of overt LOB noted; pt with strong L genu valgum which is extant PTA, pt even pointed out this gait deviation to thi stherapist; additional bout of ambulation training ~27ft wtihout AD, pt with increased Med-Lat sway and decreased step length, endorses increased instabilit yand fear of falling' educated pt on use of RW for time being both in acute care stay and at home, pt verbalized understandign.  Gait velocity decreased  Balance  Overall balance assessment Needs assistance  Sitting balance-Leahy Scale Fair  Standing balance support Bilateral upper extremity supported;No upper extremity supported;During functional activity  Standing balance-Leahy Scale Fair  PT - End of Session  Equipment Utilized During Treatment Gait belt  Activity Tolerance Patient tolerated treatment well;No increased pain  Patient left in chair;with call bell/phone within reach;with chair alarm set  Nurse Communication Mobility status  PT Assessment  PT Recommendation/Assessment Patient needs continued PT services  PT Visit Diagnosis Other abnormalities of gait and mobility (R26.89)  PT Problem List Decreased strength;Decreased activity tolerance;Decreased balance;Decreased mobility;Decreased knowledge of use of DME  PT Plan  PT Frequency (ACUTE ONLY) Min 1X/week  PT Treatment/Interventions (ACUTE ONLY) DME instruction;Gait training;Stair training;Functional mobility training;Therapeutic activities;Therapeutic exercise;Balance training  AM-PAC PT "6 Clicks" Mobility Outcome Measure (Version 2)  Help needed turning from your back to your side while in a flat bed without using bedrails? 3  Help needed moving from lying on your back to sitting on the side of a flat bed without using bedrails? 3  Help needed moving to and from a bed to a chair (including a wheelchair)? 3  Help needed standing up from a chair using your arms (e.g., wheelchair or  bedside chair)? 3  Help needed to walk in hospital room? 3  Help needed climbing 3-5 steps with a railing?  3  6 Click Score 18  Consider Recommendation of Discharge To: Home with [facility]  Progressive Mobility  What is the highest level of mobility based on the progressive mobility assessment? Level 5 (Walks with assist in room/hall) - Balance while stepping forward/back and can walk in room with assist - Complete  Mobility Referral Yes  Activity Ambulated with assistance in hallway  PT Recommendation  Follow Up Recommendations Other (comment) (Pt would prefer to join wife @[facility] ; discussed increasing HH services to 8hrs/7days and pt amenable as pt is mostly afraid of falling and not being able to get up.)  Assistance recommended at discharge Frequent or constant Supervision/Assistance  Patient can return home with the following Help with stairs or ramp for entrance;Assist for transportation;A little help with walking and/or transfers  Functional Status Assessment Patient has had a recent decline in their functional status and demonstrates the ability to make significant improvements in function in a reasonable and predictable amount of time.  PT equipment Other (comment) (Pt may benefit from rollator, will need to be trialed.)  Individuals Consulted  Consulted and Agree with Results and Recommendations Patient  Acute Rehab PT Goals  Patient Stated Goal To improve balance  PT Goal Formulation With patient  Time For Goal Achievement 08/21/22  Potential to Achieve Goals Good  PT Time Calculation  PT Start Time (ACUTE ONLY) 1135  PT Stop Time (ACUTE ONLY) 1215  PT Time Calculation (min) (ACUTE ONLY) 40 min  PT General Charges  $$ ACUTE PT VISIT 1 Visit  PT Evaluation  $  PT Eval Low Complexity 1 Low  PT Treatments  $Gait Training 8-22 mins  $Therapeutic Activity 8-22 mins   Jamesetta Geralds, PT, DPT [facility] Rehabilitation Department Office: [phone]

## 2022-08-08 DIAGNOSIS — I4891 Unspecified atrial fibrillation: Secondary | ICD-10-CM | POA: Diagnosis not present

## 2022-08-08 LAB — CBC
HCT: 32.7 % — ABNORMAL LOW (ref 39.0–52.0)
Hemoglobin: 10.9 g/dL — ABNORMAL LOW (ref 13.0–17.0)
MCH: 31 pg (ref 26.0–34.0)
MCHC: 33.3 g/dL (ref 30.0–36.0)
MCV: 92.9 fL (ref 80.0–100.0)
Platelets: 210 10*3/uL (ref 150–400)
RBC: 3.52 MIL/uL — ABNORMAL LOW (ref 4.22–5.81)
RDW: 13.9 % (ref 11.5–15.5)
WBC: 5.8 10*3/uL (ref 4.0–10.5)
nRBC: 0 % (ref 0.0–0.2)

## 2022-08-08 LAB — BASIC METABOLIC PANEL WITH GFR
Anion gap: 7 (ref 5–15)
BUN: 21 mg/dL (ref 8–23)
CO2: 25 mmol/L (ref 22–32)
Calcium: 8.6 mg/dL — ABNORMAL LOW (ref 8.9–10.3)
Chloride: 99 mmol/L (ref 98–111)
Creatinine, Ser: 0.74 mg/dL (ref 0.61–1.24)
GFR, Estimated: >60 mL/min (ref >60)
Glucose, Bld: 93 mg/dL (ref 70–99)
Potassium: 3.8 mmol/L (ref 3.5–5.1)
Sodium: 131 mmol/L — ABNORMAL LOW (ref 135–145)

## 2022-08-08 LAB — PHOSPHORUS: Phosphorus: 3.8 mg/dL (ref 2.5–4.6)

## 2022-08-08 LAB — MAGNESIUM: Magnesium: 2 mg/dL (ref 1.7–2.4)

## 2022-08-08 MED ORDER — PHENOL 1.4 % MT LIQD
1.0000 | OROMUCOSAL | Status: DC | PRN
  Administered 2022-08-08: 1 via OROMUCOSAL
  Filled 2022-08-08: qty 177

## 2022-08-08 NOTE — TOC Initial Note (Signed)
Transition of Care [facility]) - Initial/Assessment Note    Patient Details  Name: Phillip Shelton MRN: [id number] Date of Birth: September 14, 1934  [facility] CM/SW Contact:    Adrian Prows, RN Phone Number: 08/08/2022, 9:47 AM  Clinical Narrative:                 [facility] consult d/c planning' PT recc SNF; spoke w/ pt in room; pt says he is from home but plans to d/c to SNF; his preference is [facility]; he identified POC dtr [employer], [phone]; he denies SDOH risks; pt says he does not have glasses, dentures or HA; he has a cane, and walker; grab bars are present in his home shower; pt says a private duty helper came to home 5 days/week; he does not have HH services or home oxygen; pt says he can feed himself; he is continent of bowel and bladder; he reports no problems with his skin except that "it is dry"; explained SNF process and need for ins auth; pt verbalized understanding.  Expected Discharge Plan: Skilled Nursing Facility Barriers to Discharge: Continued Medical Work up   Patient Goals and CMS Choice Patient states their goals for this hospitalization and ongoing recovery are:: SNF          Expected Discharge Plan and Services   Discharge Planning Services: CM Consult   Living arrangements for the past 2 months: Single Family Home                                      Prior Living Arrangements/Services Living arrangements for the past 2 months: Single Family Home Lives with:: Self   Do you feel safe going back to the place where you live?: Yes      Need for Family Participation in Patient Care: Yes (Comment) Care giver support system in place?: Yes (comment) Current home services: DME (cane, walker) Criminal Activity/Legal Involvement Pertinent to Current Situation/Hospitalization: No - Comment as needed  Activities of Daily Living Home Assistive Devices/Equipment: Cane (specify quad or straight), Walker (specify type), Grab bars in  shower ADL Screening (condition at time of admission) Patient's cognitive ability adequate to safely complete daily activities?: Yes Is the patient deaf or have difficulty hearing?: Yes Does the patient have difficulty seeing, even when wearing glasses/contacts?: No Does the patient have difficulty concentrating, remembering, or making decisions?: No Patient able to express need for assistance with ADLs?: Yes Does the patient have difficulty dressing or bathing?: No Independently performs ADLs?: Yes (appropriate for developmental age) Does the patient have difficulty walking or climbing stairs?: Yes Weakness of Legs: Both Weakness of Arms/Hands: Both  Permission Sought/Granted Permission sought to share information with : Case Manager Permission granted to share information with : Yes, Verbal Permission Granted  Share Information with NAME: Case Manager     Permission granted to share info w Relationship: Riley Kill (dtr) [phone]     Emotional Assessment Appearance:: Appears stated age Attitude/Demeanor/Rapport: Gracious Affect (typically observed): Accepting Orientation: : Oriented to Self, Oriented to Place, Oriented to  Time, Oriented to Situation Alcohol / Substance Use: Not Applicable Psych Involvement: No (comment)  Admission diagnosis:  Hyponatremia [E87.1] New onset atrial fibrillation (HCC) [I48.91] Generalized weakness [R53.1] Patient Active Problem List   Diagnosis Date Noted   Weakness 08/06/2022   New onset atrial fibrillation (HCC) 08/06/2022   Ambulatory dysfunction 08/06/2022   Generalized osteoarthritis  12/20/2021   Insomnia 09/17/2021   Normocytic anemia 07/21/2021   Hyponatremia 07/21/2021   Vitamin D deficiency 07/20/2021   Sensorineural hearing loss (SNHL), bilateral 02/10/2021   Gait disorder 07/19/2020   Statin myopathy 07/18/2020   Osteoarthritis of left knee 04/24/2020   BPH (benign prostatic hyperplasia) 01/18/2020   History of total  hip arthroplasty 12/10/2019   Synovial cyst of left popliteal space 07/24/2018   Osteoarthritis of right knee 04/19/2017   Osteoarthritis of right hip 09/19/2014   Idiopathic Charcot's joint of foot 12/27/2012   Pure hypercholesterolemia 12/27/2012   Hemorrhoids 08/26/2010   GERD 02/12/2010   PCP:  Corwin Levins, MD Pharmacy:   [facility] [phone] - [place],  - [address] [place] [address] Phone: [phone] Fax: [phone]     Social Determinants of Health (SDOH) Social History: SDOH Screenings   Food Insecurity: No Food Insecurity (08/08/2022)  Housing: Low Risk  (08/08/2022)  Transportation Needs: No Transportation Needs (08/08/2022)  Utilities: Not At Risk (08/08/2022)  Alcohol Screen: Low Risk  (10/02/2021)  Depression (PHQ2-9): Low Risk  (07/21/2022)  Financial Resource Strain: Low Risk  (10/02/2021)  Physical Activity: Inactive (10/02/2021)  Social Connections: Moderately Isolated (10/02/2021)  Stress: No Stress Concern Present (10/02/2021)  Tobacco Use: Medium Risk (08/06/2022)   SDOH Interventions: Food Insecurity Interventions: Intervention Not Indicated, Inpatient TOC Housing Interventions: Intervention Not Indicated, Inpatient TOC Transportation Interventions: Intervention Not Indicated, Inpatient TOC Utilities Interventions: Intervention Not Indicated, Inpatient TOC   Readmission Risk Interventions     No data to display

## 2022-08-08 NOTE — Care Management Obs Status (Signed)
MEDICARE OBSERVATION STATUS NOTIFICATION   Patient Details  Name: KIETH HARTIS MRN: [id number] Date of Birth: 20-Nov-1934   Medicare Observation Status Notification Given:  Yes    Adrian Prows, RN 08/08/2022, 10:03 AM

## 2022-08-08 NOTE — Progress Notes (Signed)
PROGRESS NOTE    EVANS LEVEE  ZOX:[id number] DOB: 1934-11-17 DOA: 08/06/2022 PCP: Corwin Levins, MD   Brief Narrative:  This 88 years old Male with PMH significant for BPH,  GERD, Hyperlipidemia, overactive bladder, depression, insomnia presented in the ED with complaints of generalized weakness / fatigue and off balance for 1 week.  He called EMS after lunch because he was so weak, he was scared that he would fall and he was by self.  He states his balance has gradually gotten worse. He has chronic lower extremity edema which he states is at baseline.  Patient's brother reports that he has progressive decline that is noticeable over last 1 month.  Workup in the ED reveals chest x-ray no acute findings.  CT head no acute findings.  Admitted for further evaluation.  Assessment & Plan:   Principal Problem:   New onset atrial fibrillation (HCC) Active Problems:   Hyponatremia   Weakness   Ambulatory dysfunction   Normocytic anemia   GERD   BPH (benign prostatic hyperplasia)   Pure hypercholesterolemia  New onset atrial fibrillation : Patient presented with generalized weakness, unsteadiness, fatigue. EKG showed A-fib with NVR. BNP 128 Heart rate is well-controlled.  Patient is started on Eliquis 5 mg twice daily. 2D echo shows LVEF 50 to 55%.  Slightly reduced from 2020. Continue metoprolol 12.5 mg twice daily,   Hyponatremia: Patient has history of hyponatremia in the past but last sodium was 135. No exogenous losses and states eating and drinking well. TSH and BMP normal.   Generalized Weakness: Likely multifactorial as brother states he has been declining over past 6 months. New onset atrial fibrillation could be playing a role. Patient also seems depressed , as wife moved out to nursing home last week and he is living alone.   PT and OT evaluation.  Ambulatory dysfunction: could be due to hyponatremia, new onset A-fib. Also generalized OA especially in bilateral  knees. PT/OT Evaluation> SNF   Normocytic anemia: History of anemia, baseline 12-14 Check iron studies . B12/folate. Started on b12 back in January of 2024    GERD: Continue Pantoprazole 40 mg po daily.   BPH (benign prostatic hyperplasia) Continue his rapaflo and proscar daily.   Pure hypercholesterolemia Statin myopathy.     DVT prophylaxis: Eliquis. Code Status: Full code Family Communication: No family at bed side Disposition Plan:   Status is: Observation The patient remains OBS appropriate and will d/c before 2 midnights.   Admitted for new onset A-fib, now heart rate is controlled,  started on Eliquis.  Workup for CHF is in process.  Echo completed shows LVEF 55 to 60%.  PT and OT evaluation recommended SNF awaiting insurance authorization.   Consultants:  None  Procedures:Echo  Antimicrobials: Anti-infectives (From admission, onward)    None      Subjective: Patient was seen and examined at bedside.  Overnight events noted.   Patient reports feeling better, he appears deconditioned, heart rate is controlled.    Objective: Vitals:   08/07/22 1247 08/07/22 2027 08/08/22 0432 08/08/22 0949  BP: 119/78 113/70 118/79 122/72  Pulse: (!) 106 86 81 86  Resp:  20 19   Temp: (!) 97.4 F (36.3 C) 98 F (36.7 C) 97.6 F (36.4 C)   TempSrc: Oral Oral Oral   SpO2: 99% 97% 95%   Weight:      Height:        Intake/Output Summary (Last 24 hours) at 08/08/2022 1206 Last data filed  at 08/07/2022 2055 Gross per 24 hour  Intake 720 ml  Output --  Net 720 ml   Filed Weights   08/06/22 2111  Weight: 77.6 kg    Examination:  General exam: Appears comfortable, deconditioned, not in any acute distress. Respiratory system: CTA bilaterally.  Respiratory effort normal.  RR 14 Cardiovascular system: S1 & S2 heard, RRR. No JVD, murmurs, rubs, gallops or clicks.  Gastrointestinal system: Abdomen is non distended, soft and non tender. Normal bowel sounds  heard. Central nervous system: Alert and oriented x3. No focal neurological deficits. Extremities: No edema, no cyanosis, no clubbing Skin: No rashes, lesions or ulcers Psychiatry: Judgement and insight appear normal. Mood & affect appropriate.     Data Reviewed: I have personally reviewed following labs and imaging studies  CBC: Recent Labs  Lab 08/06/22 1600 08/07/22 0621 08/08/22 0611  WBC 8.3 6.5 5.8  NEUTROABS 5.8  --   --   HGB 11.6* 11.2* 10.9*  HCT 35.0* 33.0* 32.7*  MCV 93.3 91.9 92.9  PLT 209 222 210   Basic Metabolic Panel: Recent Labs  Lab 08/06/22 1600 08/07/22 0621 08/08/22 0611  NA 128* 131* 131*  K 4.1 3.9 3.8  CL 97* 98 99  CO2 23 26 25   GLUCOSE 101* 93 93  BUN 26* 21 21  CREATININE 0.75 0.77 0.74  CALCIUM 8.3* 8.6* 8.6*  MG 2.0  --  2.0  PHOS  --   --  3.8   GFR: Estimated Creatinine Clearance: 74.1 mL/min (by C-G formula based on SCr of 0.74 mg/dL). Liver Function Tests: Recent Labs  Lab 08/06/22 1600  AST 18  ALT 16  ALKPHOS 57  BILITOT 0.7  PROT 6.5  ALBUMIN 3.6   No results for input(s): "LIPASE", "AMYLASE" in the last 168 hours. No results for input(s): "AMMONIA" in the last 168 hours. Coagulation Profile: Recent Labs  Lab 08/06/22 2051  INR 1.1   Cardiac Enzymes: No results for input(s): "CKTOTAL", "CKMB", "CKMBINDEX", "TROPONINI" in the last 168 hours. BNP (last 3 results) No results for input(s): "PROBNP" in the last 8760 hours. HbA1C: No results for input(s): "HGBA1C" in the last 72 hours. CBG: No results for input(s): "GLUCAP" in the last 168 hours. Lipid Profile: No results for input(s): "CHOL", "HDL", "LDLCALC", "TRIG", "CHOLHDL", "LDLDIRECT" in the last 72 hours. Thyroid Function Tests: Recent Labs    08/06/22 1600  TSH 1.279   Anemia Panel: Recent Labs    08/06/22 2051  VITAMINB12 538  FOLATE 15.0  FERRITIN 206  TIBC 347  IRON 46   Sepsis Labs: No results for input(s): "PROCALCITON", "LATICACIDVEN"  in the last 168 hours.  No results found for this or any previous visit (from the past 240 hour(s)).       Radiology Studies: ECHOCARDIOGRAM COMPLETE  Result Date: 08/07/2022    ECHOCARDIOGRAM REPORT   Patient Name:   YVES FODOR Date of Exam: 08/07/2022 Medical Rec #:  [id number]       Height:       74.0 in Accession #:    [id number]      Weight:       171.1 lb Date of Birth:  April 25, 1934      BSA:          2.034 m Patient Age:    87 years        BP:           117/83 mmHg Patient Gender: M  HR:           108 bpm. Exam Location:  Inpatient Procedure: 2D Echo, Cardiac Doppler and Color Doppler Indications:    Atrial Fibrillation  History:        Patient has prior history of Echocardiogram examinations, most                 recent 11/24/2018. Arrythmias:Atrial Fibrillation,                 Signs/Symptoms:Fatigue; Risk Factors:Former Smoker.  Sonographer:    Wallie Char Referring Phys: [id number] ALLISON WOLFE IMPRESSIONS  1. Left ventricular ejection fraction, by estimation, is 50 to 55%. The left ventricle has low normal function. The left ventricle has no regional wall motion abnormalities. Left ventricular diastolic function could not be evaluated.  2. Right ventricular systolic function is normal. The right ventricular size is mildly enlarged. There is normal pulmonary artery systolic pressure.  3. The mitral valve is normal in structure. Trivial mitral valve regurgitation. No evidence of mitral stenosis.  4. The aortic valve is tricuspid. Aortic valve regurgitation is not visualized. No aortic stenosis is present.  5. Aortic dilatation noted. There is mild dilatation of the ascending aorta, measuring 42 mm.  6. The inferior vena cava is dilated in size with <50% respiratory variability, suggesting right atrial pressure of 15 mmHg. Comparison(s): Changes from prior study are noted. LVEF is 50-55% now compared to 60-65% in 2020. FINDINGS  Left Ventricle: Left ventricular ejection fraction,  by estimation, is 50 to 55%. The left ventricle has low normal function. The left ventricle has no regional wall motion abnormalities. The left ventricular internal cavity size was normal in size. There is no left ventricular hypertrophy. Left ventricular diastolic function could not be evaluated due to atrial fibrillation. Left ventricular diastolic function could not be evaluated. Right Ventricle: The right ventricular size is mildly enlarged. No increase in right ventricular wall thickness. Right ventricular systolic function is normal. There is normal pulmonary artery systolic pressure. The tricuspid regurgitant velocity is 2.28  m/s, and with an assumed right atrial pressure of 15 mmHg, the estimated right ventricular systolic pressure is 35.8 mmHg. Left Atrium: Left atrial size was normal in size. Right Atrium: Right atrial size was normal in size. Pericardium: There is no evidence of pericardial effusion. Mitral Valve: The mitral valve is normal in structure. Trivial mitral valve regurgitation. No evidence of mitral valve stenosis. MV peak gradient, 1.9 mmHg. The mean mitral valve gradient is 1.0 mmHg. Tricuspid Valve: The tricuspid valve is grossly normal. Tricuspid valve regurgitation is trivial. No evidence of tricuspid stenosis. Aortic Valve: The aortic valve is tricuspid. Aortic valve regurgitation is not visualized. No aortic stenosis is present. Aortic valve mean gradient measures 2.0 mmHg. Aortic valve peak gradient measures 3.3 mmHg. Aortic valve area, by VTI measures 4.41 cm. Pulmonic Valve: The pulmonic valve was not well visualized. Pulmonic valve regurgitation is not visualized. No evidence of pulmonic stenosis. Aorta: The aortic root is normal in size and structure and aortic dilatation noted. There is mild dilatation of the ascending aorta, measuring 42 mm. Venous: The inferior vena cava is dilated in size with less than 50% respiratory variability, suggesting right atrial pressure of 15 mmHg.  IAS/Shunts: No atrial level shunt detected by color flow Doppler.  LEFT VENTRICLE PLAX 2D LVIDd:         4.50 cm      Diastology LVIDs:         3.40 cm  LV e' medial:    10.20 cm/s LV PW:         1.00 cm      LV E/e' medial:  7.5 LV IVS:        1.00 cm      LV e' lateral:   12.40 cm/s LVOT diam:     2.50 cm      LV E/e' lateral: 6.1 LV SV:         74 LV SV Index:   37 LVOT Area:     4.91 cm  LV Volumes (MOD) LV vol d, MOD A2C: 75.4 ml LV vol d, MOD A4C: 108.0 ml LV vol s, MOD A2C: 33.9 ml LV vol s, MOD A4C: 48.4 ml LV SV MOD A2C:     41.5 ml LV SV MOD A4C:     108.0 ml LV SV MOD BP:      47.1 ml RIGHT VENTRICLE             IVC RV Basal diam:  4.20 cm     IVC diam: 2.50 cm RV S prime:     14.33 cm/s TAPSE (M-mode): 2.0 cm LEFT ATRIUM             Index        RIGHT ATRIUM           Index LA diam:        3.70 cm 1.82 cm/m   RA Area:     19.90 cm LA Vol (A2C):   63.2 ml 31.07 ml/m  RA Volume:   62.30 ml  30.63 ml/m LA Vol (A4C):   59.7 ml 29.35 ml/m LA Biplane Vol: 61.9 ml 30.43 ml/m  AORTIC VALVE AV Area (Vmax):    4.19 cm AV Area (Vmean):   4.22 cm AV Area (VTI):     4.41 cm AV Vmax:           90.58 cm/s AV Vmean:          65.650 cm/s AV VTI:            0.168 m AV Peak Grad:      3.3 mmHg AV Mean Grad:      2.0 mmHg LVOT Vmax:         77.38 cm/s LVOT Vmean:        56.475 cm/s LVOT VTI:          0.151 m LVOT/AV VTI ratio: 0.90  AORTA Ao Root diam: 3.70 cm Ao Asc diam:  4.20 cm MITRAL VALVE               TRICUSPID VALVE MV Area (PHT): 3.64 cm    TR Peak grad:   20.8 mmHg MV Area VTI:   5.01 cm    TR Vmax:        228.00 cm/s MV Peak grad:  1.9 mmHg MV Mean grad:  1.0 mmHg    SHUNTS MV Vmax:       0.69 m/s    Systemic VTI:  0.15 m MV Vmean:      39.5 cm/s   Systemic Diam: 2.50 cm MV Decel Time: 208 msec MV E velocity: 76.10 cm/s Vishnu Priya Mallipeddi Electronically signed by Winfield Rast Mallipeddi Signature Date/Time: 08/07/2022/12:21:07 PM    Final    CT Head Wo Contrast  Result Date:  08/06/2022 CLINICAL DATA:  Gait issue.  Weakness and fatigue. EXAM: CT HEAD WITHOUT CONTRAST TECHNIQUE: Contiguous axial images were obtained from the base  of the skull through the vertex without intravenous contrast. RADIATION DOSE REDUCTION: This exam was performed according to the departmental dose-optimization program which includes automated exposure control, adjustment of the mA and/or kV according to patient size and/or use of iterative reconstruction technique. COMPARISON:  None Available. FINDINGS: Brain: No intracranial hemorrhage, mass effect, or midline shift. Age related atrophy and chronic small vessel ischemia. No hydrocephalus. The basilar cisterns are patent. No evidence of territorial infarct or acute ischemia. Bilateral basal gangliar mineralization is typically senescent and of no clinical significance. No extra-axial or intracranial fluid collection. Vascular: Atherosclerosis of skullbase vasculature without hyperdense vessel or abnormal calcification. Skull: No skull fracture. Scattered arachnoid granulations, including large granulation in the right occipital bone. Sinuses/Orbits: Paranasal sinuses and mastoid air cells are clear. The visualized orbits are unremarkable. Other: None. IMPRESSION: 1. No acute intracranial abnormality. 2. Age related atrophy and chronic small vessel ischemia. Electronically Signed   By: Narda Rutherford M.D.   On: 08/06/2022 16:12   DG Chest 2 View  Result Date: 08/06/2022 CLINICAL DATA:  Weakness. EXAM: CHEST - 2 VIEW COMPARISON:  May 28, 2009. FINDINGS: The heart size and mediastinal contours are within normal limits. Both lungs are clear. Degenerative changes are seen involving the glenohumeral joints bilaterally. IMPRESSION: No active cardiopulmonary disease. Electronically Signed   By: Lupita Raider M.D.   On: 08/06/2022 15:55    Scheduled Meds:  apixaban  5 mg Oral BID   finasteride  5 mg Oral Daily   metoprolol tartrate  12.5 mg Oral BID    pantoprazole  40 mg Oral Daily   sodium chloride flush  3 mL Intravenous Q12H   tamsulosin  0.4 mg Oral QPC supper   Continuous Infusions:  sodium chloride       LOS: 0 days    Time spent: 35 mins    Willeen Niece, MD [facility]   If 7PM-7AM, please contact night-coverage

## 2022-08-08 NOTE — Plan of Care (Signed)
Cont with plan of care

## 2022-08-09 DIAGNOSIS — I4891 Unspecified atrial fibrillation: Secondary | ICD-10-CM | POA: Diagnosis not present

## 2022-08-09 MED ORDER — POTASSIUM CHLORIDE 20 MEQ PO PACK
40.0000 meq | PACK | Freq: Once | ORAL | Status: AC
  Administered 2022-08-09: 40 meq via ORAL
  Filled 2022-08-09: qty 2

## 2022-08-09 NOTE — NC FL2 (Signed)
MEDICAID FL2 LEVEL OF CARE FORM     IDENTIFICATION  Patient Name: Phillip Shelton Birthdate: 1934-05-19 Sex: male Admission Date (Current Location): 08/06/2022  [facility] and [place] Number:  Producer, television/film/video and Address:  [facility],  [address] [place], [place] [address]      Provider Number: [id number]  Attending Physician Name and Address:  Willeen Niece, MD  Relative Name and Phone Number:  Mihir, V[facility])  [phone] [facility])    Current Level of Care:   Recommended Level of Care: Skilled Nursing Facility Prior Approval Number:    Date Approved/Denied:   PASRR Number: [id number]  Discharge Plan: SNF    Current Diagnoses: Patient Active Problem List   Diagnosis Date Noted   Weakness 08/06/2022   New onset atrial fibrillation (HCC) 08/06/2022   Ambulatory dysfunction 08/06/2022   Generalized osteoarthritis 12/20/2021   Insomnia 09/17/2021   Normocytic anemia 07/21/2021   Hyponatremia 07/21/2021   Vitamin D deficiency 07/20/2021   Sensorineural hearing loss (SNHL), bilateral 02/10/2021   Gait disorder 07/19/2020   Statin myopathy 07/18/2020   Osteoarthritis of left knee 04/24/2020   BPH (benign prostatic hyperplasia) 01/18/2020   History of total hip arthroplasty 12/10/2019   Synovial cyst of left popliteal space 07/24/2018   Osteoarthritis of right knee 04/19/2017   Osteoarthritis of right hip 09/19/2014   Idiopathic Charcot's joint of foot 12/27/2012   Pure hypercholesterolemia 12/27/2012   Hemorrhoids 08/26/2010   GERD 02/12/2010    Orientation RESPIRATION BLADDER Height & Weight     Self, Time, Situation, Place  Normal Continent Weight: 77.6 kg Height:  6\' 2"  (188 cm)  BEHAVIORAL SYMPTOMS/MOOD NEUROLOGICAL BOWEL NUTRITION STATUS        Diet (Heart healthy diet)  AMBULATORY STATUS COMMUNICATION OF NEEDS Skin   Limited Assist Verbally Normal                       Personal Care Assistance Level of  Assistance  Bathing, Feeding, Dressing Bathing Assistance: Limited assistance Feeding assistance: Limited assistance Dressing Assistance: Limited assistance     Functional Limitations Info  Sight, Hearing, Speech Sight Info: Impaired (eyeglasses) Hearing Info: Impaired Speech Info: Adequate    SPECIAL CARE FACTORS FREQUENCY                       Contractures Contractures Info: Not present    Additional Factors Info  Code Status, Allergies, Psychotropic Code Status Info: Full Code Allergies Info: Atorvastatin Psychotropic Info: N/A         Current Medications (08/09/2022):  This is the current hospital active medication list Current Facility-Administered Medications  Medication Dose Route Frequency Provider Last Rate Last Admin   0.9 %  sodium chloride infusion  250 mL Intravenous PRN Orland Mustard, MD       acetaminophen (TYLENOL) tablet 650 mg  650 mg Oral Q6H PRN Orland Mustard, MD   650 mg at 08/08/22 2130   Or   acetaminophen (TYLENOL) suppository 650 mg  650 mg Rectal Q6H PRN Orland Mustard, MD       apixaban Everlene Balls) tablet 5 mg  5 mg Oral BID Orland Mustard, MD   5 mg at 08/09/22 0847   finasteride (PROSCAR) tablet 5 mg  5 mg Oral Daily Orland Mustard, MD   5 mg at 08/09/22 0847   lip balm (CARMEX) ointment   Topical PRN Willeen Niece, MD       metoprolol tartrate (LOPRESSOR)  tablet 12.5 mg  12.5 mg Oral BID Orland Mustard, MD   12.5 mg at 08/09/22 0847   pantoprazole (PROTONIX) EC tablet 40 mg  40 mg Oral Daily Orland Mustard, MD   40 mg at 08/09/22 0848   phenol (CHLORASEPTIC) mouth spray 1 spray  1 spray Mouth/Throat PRN Willeen Niece, MD   1 spray at 08/08/22 0532   sodium chloride flush (NS) 0.9 % injection 3 mL  3 mL Intravenous Q12H Orland Mustard, MD   3 mL at 08/09/22 0849   sodium chloride flush (NS) 0.9 % injection 3 mL  3 mL Intravenous PRN Orland Mustard, MD       tamsulosin [facility]) capsule 0.4 mg  0.4 mg Oral QPC supper Orland Mustard, MD    0.4 mg at 08/08/22 1720   temazepam (RESTORIL) capsule 15 mg  15 mg Oral QHS PRN Orland Mustard, MD   15 mg at 08/08/22 2130     Discharge Medications: Please see discharge summary for a list of discharge medications.  Relevant Imaging Results:  Relevant Lab Results:   Additional Information SSN [id number]  Howell Rucks, RN

## 2022-08-09 NOTE — Plan of Care (Signed)
  Problem: Education: Goal: Knowledge of disease or condition will improve Outcome: Progressing   

## 2022-08-09 NOTE — Progress Notes (Signed)
Physical Therapy Treatment Patient Details Name: Phillip Shelton MRN: [id number] DOB: June 09, 1934 Today's Date: 08/09/2022   History of Present Illness Pt is an 88yo male presnting to [facility] ED with complaints of fatigue, weakness, and feeling off balance; found to be in new Afib. Of note recent bilateral knee drainage 7/31. PMH: BPH, GERD, HLD, OAB, depression, insomnia, R-THA,cortisone shots to bilateral shoulders    PT Comments  Pt progressing well with skilled PT, appears to have RW set at too high of setting so therapist readjusted and pt appears more comfortable. Pt ambulates 180 ft with RW, CGA for safety, significant genu valgum, good bil foot clearance, increased bil weight shifting, no overt LOB. Pt tolerates exercises without complaints. Pt verbalizes fear of falling, reports RLE buckling at home causing falls, and no one to assist at home.    If plan is discharge home, recommend the following: A little help with walking and/or transfers;A little help with bathing/dressing/bathroom;Assistance with cooking/housework;Assist for transportation;Help with stairs or ramp for entrance   Can travel by private vehicle     Yes  Equipment Recommendations  None recommended by PT    Recommendations for Other Services       Precautions / Restrictions Precautions Precautions: Fall Restrictions Weight Bearing Restrictions: No     Mobility  Bed Mobility               General bed mobility comments: in recliner upon arrival    Transfers Overall transfer level: Needs assistance Equipment used: Rolling walker (2 wheels) Transfers: Sit to/from Stand Sit to Stand: Supervision           General transfer comment: close supv, verbal cues for hand placement, no physical assist    Ambulation/Gait Ambulation/Gait assistance: Min guard Gait Distance (Feet): 180 Feet Assistive device: Rolling walker (2 wheels) Gait Pattern/deviations: Step-through pattern, Decreased stride  length Gait velocity: decreased     General Gait Details: step through gait pattern, increased lateral weight shifting, bil genu valgum with knees touching, no overt LOB, no LE buckling noted though pt reports R knee gives way and causes him to fall at home   Stairs             Wheelchair Mobility     Tilt Bed    Modified Rankin (Stroke Patients Only)       Balance Overall balance assessment: Needs assistance         Standing balance support: Reliant on assistive device for balance, During functional activity, Bilateral upper extremity supported Standing balance-Leahy Scale: Poor                              Cognition Arousal/Alertness: Awake/alert Behavior During Therapy: WFL for tasks assessed/performed Overall Cognitive Status: Within Functional Limits for tasks assessed                                          Exercises General Exercises - Lower Extremity Long Arc Quad: Seated, AROM, Strengthening, Both, 15 reps Hip Flexion/Marching: Seated, AROM, Strengthening, Both, 15 reps Other Exercises Other Exercises: STS x5 reps    General Comments        Pertinent Vitals/Pain Pain Assessment Pain Assessment: No/denies pain    Home Living  Prior Function            PT Goals (current goals can now be found in the care plan section) Acute Rehab PT Goals Patient Stated Goal: To improve balance PT Goal Formulation: With patient Time For Goal Achievement: 08/21/22 Potential to Achieve Goals: Good Progress towards PT goals: Progressing toward goals    Frequency    Min 1X/week      PT Plan Current plan remains appropriate    Co-evaluation              AM-PAC PT "6 Clicks" Mobility   Outcome Measure  Help needed turning from your back to your side while in a flat bed without using bedrails?: A Little Help needed moving from lying on your back to sitting on the side of a flat  bed without using bedrails?: A Little Help needed moving to and from a bed to a chair (including a wheelchair)?: A Little Help needed standing up from a chair using your arms (e.g., wheelchair or bedside chair)?: A Little Help needed to walk in hospital room?: A Little Help needed climbing 3-5 steps with a railing? : A Lot 6 Click Score: 17    End of Session Equipment Utilized During Treatment: Gait belt Activity Tolerance: Patient tolerated treatment well Patient left: in chair;with call bell/phone within reach;with chair alarm set;with family/visitor present Nurse Communication: Mobility status PT Visit Diagnosis: Other abnormalities of gait and mobility (R26.89)     Time: 1610-9604 PT Time Calculation (min) (ACUTE ONLY): 31 min  Charges:    $Gait Training: 8-22 mins $Therapeutic Exercise: 8-22 mins PT General Charges $$ ACUTE PT VISIT: 1 Visit                     Tori  PT, DPT 08/09/22, 12:57 PM

## 2022-08-09 NOTE — TOC Progression Note (Addendum)
Transition of Care [facility]) - Progression Note    Patient Details  Name: Phillip Shelton MRN: [id number] Date of Birth: [birth year]  Transition of Care [facility]) CM/SW Contact  Howell Rucks, RN Phone Number: 08/09/2022, 10:38 AM  Clinical Narrative: [facility] consult for short term rehab-SNF, per teams chat from attending, pt agreeable, requesting [facility] as his wife is currently admitted there for short term rehab. PASRR and FL2 completed. Bed offer faxed out for [facility] in SNF HUB, await bed offer. TOC will continue to follow.   -2:12pm Met with pt and pt's brother(Henry) at bedside to introduce role of TOC/NCM and review for dc planning -PT recommendation for short term rehab-SNF, pt agreeable, confirmed he prefers [facility] as his wife is currently admitted there. [facility] has made bed offer.  [employer] [place] insurance auth initiated via [place].   -2:51pm [employer] [place] auth initiated via [place]. Auth ID [id number], await authorization.    Expected Discharge Plan: Skilled Nursing Facility Barriers to Discharge: Continued Medical Work up  Expected Discharge Plan and Services   Discharge Planning Services: CM Consult   Living arrangements for the past 2 months: Single Family Home                                       Social Determinants of Health (SDOH) Interventions SDOH Screenings   Food Insecurity: No Food Insecurity (08/08/2022)  Housing: Low Risk  (08/08/2022)  Transportation Needs: No Transportation Needs (08/08/2022)  Utilities: Not At Risk (08/08/2022)  Alcohol Screen: Low Risk  (10/02/2021)  Depression (PHQ2-9): Low Risk  (07/21/2022)  Financial Resource Strain: Low Risk  (10/02/2021)  Physical Activity: Inactive (10/02/2021)  Social Connections: Moderately Isolated (10/02/2021)  Stress: No Stress Concern Present (10/02/2021)  Tobacco Use: Medium Risk (08/06/2022)    Readmission Risk Interventions     No data to display

## 2022-08-09 NOTE — Progress Notes (Signed)
PROGRESS NOTE    Phillip Shelton  JYN:[id number] DOB: 11-Jul-1934 DOA: 08/06/2022 PCP: Corwin Levins, MD   Brief Narrative:  This 88 years old Male with PMH significant for BPH,  GERD, Hyperlipidemia, overactive bladder, depression, insomnia presented in the ED with complaints of generalized weakness / fatigue and off balance for 1 week.  He called EMS after lunch because he was so weak, he was scared that he would fall and he was by self.  He states his balance has gradually gotten worse. He has chronic lower extremity edema which he states is at baseline.  Patient's brother reports that he has progressive decline that is noticeable over last 1 month.  Workup in the ED reveals chest x-ray no acute findings.  CT head no acute findings.  Admitted for further evaluation.  Assessment & Plan:   Principal Problem:   New onset atrial fibrillation (HCC) Active Problems:   Hyponatremia   Weakness   Ambulatory dysfunction   Normocytic anemia   GERD   BPH (benign prostatic hyperplasia)   Pure hypercholesterolemia  New onset atrial fibrillation : Patient presented with generalized weakness, unsteadiness, and fatigue. EKG showed A-fib with NVR. BNP 128 Heart rate is well-controlled.  Patient is started on Eliquis 5 mg twice daily. 2D echo shows LVEF 50 to 55%.  Slightly reduced from 2020. Continue metoprolol 12.5 mg twice daily,   Hyponatremia: Patient has history of hyponatremia in the past but last sodium was 135. No exogenous losses and states eating and drinking well. TSH and BMP normal. Continue to monitor serum sodium.   Generalized Weakness: Likely multifactorial as brother states he has been declining over past 6 months. New onset atrial fibrillation could be playing a role. Patient also seems depressed , as wife moved out to nursing home last week and he is living alone.   PT and OT evaluation.  Ambulatory dysfunction: could be due to hyponatremia, new onset A-fib. 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 PT/OT Evaluation> SNF   Normocytic anemia: History of anemia, baseline 12-14 Check iron studies . B12/folate. Started on b12 back in January of 2024    GERD: Continue Pantoprazole 40 mg po daily.   BPH (benign prostatic hyperplasia) Continue his rapaflo and proscar daily.   Pure hypercholesterolemia Statin myopathy.     DVT prophylaxis: Eliquis. Code Status: Full code Family Communication: No family at bed side Disposition Plan:   Status is: Observation The patient remains OBS appropriate and will d/c before 2 midnights.    Admitted for new onset A-fib, now heart rate is controlled,  started on Eliquis.  Workup for CHF is in process.  Echo completed shows LVEF 55 to 60%.  PT and OT evaluation recommended SNF awaiting insurance authorization.   Consultants:  None  Procedures:Echo  Antimicrobials: Anti-infectives (From admission, onward)    None      Subjective: Patient was seen and examined at bedside.  Overnight events noted.   Patient reports feeling better, he appears deconditioned, heart rate is controlled, Patient is medically clear,  awaiting SNF placement.  Objective: Vitals:   08/08/22 0949 08/08/22 1403 08/08/22 2114 08/09/22 0636  BP: 122/72 122/76 136/88 117/70  Pulse: 86 83 95 78  Resp:  18 16 14   Temp:  98.7 F (37.1 C) 97.9 F (36.6 C) 97.6 F (36.4 C)  TempSrc:  Oral Oral Oral  SpO2:  100% 98% 96%  Weight:      Height:  Intake/Output Summary (Last 24 hours) at 08/09/2022 1139 Last data filed at 08/09/2022 0929 Gross per 24 hour  Intake 1700 ml  Output --  Net 1700 ml   Filed Weights   08/06/22 2111  Weight: 77.6 kg    Examination:  General exam: Appears comfortable, deconditioned, not in any acute distress. Respiratory system: CTA bilaterally.  Respiratory effort normal.  RR 14 Cardiovascular system: S1 & S2 heard, irregular rhythm, no murmur. Gastrointestinal system: Abdomen  is soft, nontender, nondistended, bowel sounds present. Central nervous system: Alert and oriented x3. No focal neurological deficits. Extremities: No edema, no cyanosis, no clubbing Skin: No rashes, lesions or ulcers Psychiatry: Judgement and insight appear normal. Mood & affect appropriate.     Data Reviewed: I have personally reviewed following labs and imaging studies  CBC: Recent Labs  Lab 08/06/22 1600 08/07/22 0621 08/08/22 0611 08/09/22 0439  WBC 8.3 6.5 5.8 6.0  NEUTROABS 5.8  --   --   --   HGB 11.6* 11.2* 10.9* 10.8*  HCT 35.0* 33.0* 32.7* 32.2*  MCV 93.3 91.9 92.9 92.3  PLT 209 222 210 210   Basic Metabolic Panel: Recent Labs  Lab 08/06/22 1600 08/07/22 0621 08/08/22 0611 08/09/22 0439  NA 128* 131* 131* 131*  K 4.1 3.9 3.8 3.4*  CL 97* 98 99 100  CO2 23 26 25 23   GLUCOSE 101* 93 93 92  BUN 26* 21 21 20   CREATININE 0.75 0.77 0.74 0.68  CALCIUM 8.3* 8.6* 8.6* 8.4*  MG 2.0  --  2.0 2.0  PHOS  --   --  3.8 3.0   GFR: Estimated Creatinine Clearance: 74.1 mL/min (by C-G formula based on SCr of 0.68 mg/dL). Liver Function Tests: Recent Labs  Lab 08/06/22 1600  AST 18  ALT 16  ALKPHOS 57  BILITOT 0.7  PROT 6.5  ALBUMIN 3.6   No results for input(s): "LIPASE", "AMYLASE" in the last 168 hours. No results for input(s): "AMMONIA" in the last 168 hours. Coagulation Profile: Recent Labs  Lab 08/06/22 2051  INR 1.1   Cardiac Enzymes: No results for input(s): "CKTOTAL", "CKMB", "CKMBINDEX", "TROPONINI" in the last 168 hours. BNP (last 3 results) No results for input(s): "PROBNP" in the last 8760 hours. HbA1C: No results for input(s): "HGBA1C" in the last 72 hours. CBG: No results for input(s): "GLUCAP" in the last 168 hours. Lipid Profile: No results for input(s): "CHOL", "HDL", "LDLCALC", "TRIG", "CHOLHDL", "LDLDIRECT" in the last 72 hours. Thyroid Function Tests: Recent Labs    08/06/22 1600  TSH 1.279   Anemia Panel: Recent Labs     08/06/22 2051  VITAMINB12 538  FOLATE 15.0  FERRITIN 206  TIBC 347  IRON 46   Sepsis Labs: No results for input(s): "PROCALCITON", "LATICACIDVEN" in the last 168 hours.  No results found for this or any previous visit (from the past 240 hour(s)).       Radiology Studies: No results found.  Scheduled Meds:  apixaban  5 mg Oral BID   finasteride  5 mg Oral Daily   metoprolol tartrate  12.5 mg Oral BID   pantoprazole  40 mg Oral Daily   sodium chloride flush  3 mL Intravenous Q12H   tamsulosin  0.4 mg Oral QPC supper   Continuous Infusions:  sodium chloride       LOS: 0 days    Time spent: 35 mins    Willeen Niece, MD [facility]   If 7PM-7AM, please contact night-coverage

## 2022-08-10 DIAGNOSIS — R600 Localized edema: Secondary | ICD-10-CM | POA: Diagnosis not present

## 2022-08-10 DIAGNOSIS — Z751 Person awaiting admission to adequate facility elsewhere: Secondary | ICD-10-CM | POA: Diagnosis not present

## 2022-08-10 DIAGNOSIS — E538 Deficiency of other specified B group vitamins: Secondary | ICD-10-CM | POA: Diagnosis not present

## 2022-08-10 DIAGNOSIS — Z87891 Personal history of nicotine dependence: Secondary | ICD-10-CM | POA: Diagnosis not present

## 2022-08-10 DIAGNOSIS — E785 Hyperlipidemia, unspecified: Secondary | ICD-10-CM | POA: Diagnosis not present

## 2022-08-10 DIAGNOSIS — G720 Drug-induced myopathy: Secondary | ICD-10-CM | POA: Diagnosis not present

## 2022-08-10 DIAGNOSIS — Z7901 Long term (current) use of anticoagulants: Secondary | ICD-10-CM | POA: Diagnosis not present

## 2022-08-10 DIAGNOSIS — L299 Pruritus, unspecified: Secondary | ICD-10-CM | POA: Diagnosis not present

## 2022-08-10 DIAGNOSIS — E871 Hypo-osmolality and hyponatremia: Secondary | ICD-10-CM | POA: Diagnosis not present

## 2022-08-10 DIAGNOSIS — T466X5A Adverse effect of antihyperlipidemic and antiarteriosclerotic drugs, initial encounter: Secondary | ICD-10-CM | POA: Diagnosis present

## 2022-08-10 DIAGNOSIS — K219 Gastro-esophageal reflux disease without esophagitis: Secondary | ICD-10-CM | POA: Diagnosis not present

## 2022-08-10 DIAGNOSIS — M1990 Unspecified osteoarthritis, unspecified site: Secondary | ICD-10-CM | POA: Diagnosis present

## 2022-08-10 DIAGNOSIS — F32A Depression, unspecified: Secondary | ICD-10-CM | POA: Diagnosis present

## 2022-08-10 DIAGNOSIS — N3281 Overactive bladder: Secondary | ICD-10-CM | POA: Diagnosis present

## 2022-08-10 DIAGNOSIS — E7800 Pure hypercholesterolemia, unspecified: Secondary | ICD-10-CM | POA: Diagnosis not present

## 2022-08-10 DIAGNOSIS — R531 Weakness: Secondary | ICD-10-CM | POA: Diagnosis not present

## 2022-08-10 DIAGNOSIS — N400 Enlarged prostate without lower urinary tract symptoms: Secondary | ICD-10-CM | POA: Diagnosis present

## 2022-08-10 DIAGNOSIS — G4700 Insomnia, unspecified: Secondary | ICD-10-CM | POA: Diagnosis not present

## 2022-08-10 DIAGNOSIS — Z8249 Family history of ischemic heart disease and other diseases of the circulatory system: Secondary | ICD-10-CM | POA: Diagnosis not present

## 2022-08-10 DIAGNOSIS — Z96641 Presence of right artificial hip joint: Secondary | ICD-10-CM | POA: Diagnosis present

## 2022-08-10 DIAGNOSIS — R079 Chest pain, unspecified: Secondary | ICD-10-CM | POA: Diagnosis not present

## 2022-08-10 DIAGNOSIS — D519 Vitamin B12 deficiency anemia, unspecified: Secondary | ICD-10-CM | POA: Diagnosis present

## 2022-08-10 DIAGNOSIS — Z8261 Family history of arthritis: Secondary | ICD-10-CM | POA: Diagnosis not present

## 2022-08-10 DIAGNOSIS — Z79899 Other long term (current) drug therapy: Secondary | ICD-10-CM | POA: Diagnosis not present

## 2022-08-10 DIAGNOSIS — Z743 Need for continuous supervision: Secondary | ICD-10-CM | POA: Diagnosis not present

## 2022-08-10 DIAGNOSIS — R1903 Right lower quadrant abdominal swelling, mass and lump: Secondary | ICD-10-CM | POA: Diagnosis not present

## 2022-08-10 DIAGNOSIS — R262 Difficulty in walking, not elsewhere classified: Secondary | ICD-10-CM | POA: Diagnosis not present

## 2022-08-10 DIAGNOSIS — D529 Folate deficiency anemia, unspecified: Secondary | ICD-10-CM | POA: Diagnosis present

## 2022-08-10 DIAGNOSIS — I4891 Unspecified atrial fibrillation: Secondary | ICD-10-CM | POA: Diagnosis not present

## 2022-08-10 DIAGNOSIS — R2689 Other abnormalities of gait and mobility: Secondary | ICD-10-CM | POA: Diagnosis not present

## 2022-08-10 DIAGNOSIS — Z888 Allergy status to other drugs, medicaments and biological substances status: Secondary | ICD-10-CM | POA: Diagnosis not present

## 2022-08-10 DIAGNOSIS — Z7401 Bed confinement status: Secondary | ICD-10-CM | POA: Diagnosis not present

## 2022-08-10 DIAGNOSIS — D62 Acute posthemorrhagic anemia: Secondary | ICD-10-CM | POA: Diagnosis not present

## 2022-08-10 MED ORDER — MIRTAZAPINE 15 MG PO TABS
7.5000 mg | ORAL_TABLET | Freq: Every day | ORAL | Status: DC
  Administered 2022-08-10 – 2022-08-13 (×4): 7.5 mg via ORAL
  Filled 2022-08-10 (×4): qty 1

## 2022-08-10 NOTE — Plan of Care (Signed)
  Problem: Education: Goal: Knowledge of disease or condition will improve Outcome: Progressing   Problem: Clinical Measurements: Goal: Diagnostic test results will improve Outcome: Progressing

## 2022-08-10 NOTE — TOC Progression Note (Signed)
Transition of Care [facility]) - Progression Note    Patient Details  Name: Phillip Shelton MRN: [id number] Date of Birth: [birth year]  Transition of Care [facility]) CM/SW Contact  Howell Rucks, RN Phone Number: 08/10/2022, 4:14 PM  Clinical Narrative: Call received from [facility] with [facility], reports insurance Medical Director is requesting a Peer to Peer prior to approving short term rehab at [facility], phone [phone], Opt 5 by 08/11/22 at 11:30am EST, attending notified.      Expected Discharge Plan: Skilled Nursing Facility Barriers to Discharge: Continued Medical Work up  Expected Discharge Plan and Services   Discharge Planning Services: CM Consult   Living arrangements for the past 2 months: Single Family Home                                       Social Determinants of Health (SDOH) Interventions SDOH Screenings   Food Insecurity: No Food Insecurity (08/08/2022)  Housing: Low Risk  (08/08/2022)  Transportation Needs: No Transportation Needs (08/08/2022)  Utilities: Not At Risk (08/08/2022)  Alcohol Screen: Low Risk  (10/02/2021)  Depression (PHQ2-9): Low Risk  (07/21/2022)  Financial Resource Strain: Low Risk  (10/02/2021)  Physical Activity: Inactive (10/02/2021)  Social Connections: Moderately Isolated (10/02/2021)  Stress: No Stress Concern Present (10/02/2021)  Tobacco Use: Medium Risk (08/06/2022)    Readmission Risk Interventions     No data to display

## 2022-08-10 NOTE — Progress Notes (Signed)
OT Cancellation Note  Patient Details Name: Phillip Shelton MRN: [id number] DOB: 02/11/1934   Cancelled Treatment:    Reason Eval/Treat Not Completed: Other (comment) Patient walking in hallway with mobility specialist at this time. OT to continue to follow. Rosalio Loud, MS Acute Rehabilitation Department Office# [phone]  08/10/2022, 10:58 AM

## 2022-08-10 NOTE — Progress Notes (Signed)
Pt had SVT 169 nonsustained asymptomatic. Garner Nash, NP notified. No new orders at this time.

## 2022-08-10 NOTE — Progress Notes (Signed)
PROGRESS NOTE    Phillip Shelton  UJW:[id number] DOB: June 15, 1934 DOA: 08/06/2022 PCP: Corwin Levins, MD   Brief Narrative:  This 88 years old Male with PMH significant for BPH,  GERD, Hyperlipidemia, overactive bladder, depression, insomnia presented in the ED with complaints of generalized weakness / fatigue and off balance for 1 week.  He called EMS after lunch because he was so weak, he was scared that he would fall and he was by himself.  He states his balance has gradually gotten worse. He has chronic lower extremity edema which he states is at baseline.  Patient's brother reports that he has progressive decline that is noticeable over last 1 month.  Workup in the ED reveals chest x-ray no acute findings.  CT head no acute findings.  EKG shows new onset A-fib.  Admitted for further evaluation.  Assessment & Plan:   Principal Problem:   New onset atrial fibrillation (HCC) Active Problems:   Hyponatremia   Weakness   Ambulatory dysfunction   Normocytic anemia   GERD   BPH (benign prostatic hyperplasia)   Pure hypercholesterolemia  New onset atrial fibrillation : Patient presented with generalized weakness, unsteadiness, and fatigue. EKG showed A-fib with NVR. BNP 128 Heart rate is well-controlled.  Patient is started on Eliquis 5 mg twice daily. 2D echo showed LVEF 50 to 55%.  Slightly reduced from 2020. Continue metoprolol 12.5 mg twice daily, Eliquis 5 mg BID.  Hyponatremia: Patient has history of hyponatremia in the past but last sodium was 135. No exogenous losses and states eating and drinking well. TSH and BMP normal. Continue to monitor serum sodium.   Generalized Weakness: Likely multifactorial as brother states he has been declining over past 6 months. New onset atrial fibrillation could be playing a role. Patient also seems depressed , as wife moved out to nursing home last week and he is living alone.   PT and OT evaluation> SNF  Ambulatory dysfunction: could be  due to hyponatremia, new onset A-fib. 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 PT/OT Evaluation> SNF   Normocytic anemia: History of anemia, baseline 12-14 B12/folate. Started on b12 back in January of 2024    GERD: Continue Pantoprazole 40 mg po daily.   BPH (benign prostatic hyperplasia) Continue his rapaflo and proscar daily.   Pure hypercholesterolemia Statin myopathy.     DVT prophylaxis: Eliquis. Code Status: Full code Family Communication: No family at bed side Disposition Plan:  Status is: Inpatient Remains inpatient appropriate because:    Admitted for new onset A-fib, now heart rate is controlled,  started on Eliquis.  Workup for CHF is in process.  Echo completed shows LVEF 55 to 60%.  PT and OT evaluation recommended SNF,  awaiting insurance authorization.   Consultants:  None  Procedures:Echo  Antimicrobials: Anti-infectives (From admission, onward)    None      Subjective: Patient was seen and examined at bedside.  Overnight events noted.   Patient reports feeling much better.  He appears very deconditioned,  heart rate is well-controlled. Patient is medically clear,  awaiting SNF placement.  Objective: Vitals:   08/09/22 0636 08/09/22 1258 08/09/22 2047 08/10/22 0613  BP: 117/70 120/65 124/83 136/87  Pulse: 78 84 78 81  Resp: 14 20  18   Temp: 97.6 F (36.4 C) 97.8 F (36.6 C) 97.7 F (36.5 C) 97.9 F (36.6 C)  TempSrc: Oral Oral Oral Oral  SpO2: 96% 100% 97% 99%  Weight:      Height:  Intake/Output Summary (Last 24 hours) at 08/10/2022 1305 Last data filed at 08/10/2022 0807 Gross per 24 hour  Intake 820 ml  Output --  Net 820 ml   Filed Weights   08/06/22 2111  Weight: 77.6 kg    Examination:  General exam: Appears comfortable, deconditioned, not in any acute distress. Respiratory system: CTA bilaterally.  Respiratory effort normal.  RR 13 Cardiovascular system: S1 & S2 heard, irregular rhythm, no  murmur. Gastrointestinal system: Abdomen is soft, non tender, non distended, bowel sounds present. Central nervous system: Alert and oriented x 3. No focal neurological deficits. Extremities: No edema, no cyanosis, no clubbing Skin: No rashes, lesions or ulcers Psychiatry: Judgement and insight appear normal. Mood & affect appropriate.     Data Reviewed: I have personally reviewed following labs and imaging studies  CBC: Recent Labs  Lab 08/06/22 1600 08/07/22 0621 08/08/22 0611 08/09/22 0439 08/10/22 0507  WBC 8.3 6.5 5.8 6.0 6.2  NEUTROABS 5.8  --   --   --   --   HGB 11.6* 11.2* 10.9* 10.8* 11.1*  HCT 35.0* 33.0* 32.7* 32.2* 33.1*  MCV 93.3 91.9 92.9 92.3 92.2  PLT 209 222 210 210 230   Basic Metabolic Panel: Recent Labs  Lab 08/06/22 1600 08/07/22 0621 08/08/22 0611 08/09/22 0439 08/10/22 0507  NA 128* 131* 131* 131* 130*  K 4.1 3.9 3.8 3.4* 3.9  CL 97* 98 99 100 100  CO2 23 26 25 23 23   GLUCOSE 101* 93 93 92 92  BUN 26* 21 21 20 17   CREATININE 0.75 0.77 0.74 0.68 0.69  CALCIUM 8.3* 8.6* 8.6* 8.4* 8.6*  MG 2.0  --  2.0 2.0  --   PHOS  --   --  3.8 3.0  --    GFR: Estimated Creatinine Clearance: 74.1 mL/min (by C-G formula based on SCr of 0.69 mg/dL). Liver Function Tests: Recent Labs  Lab 08/06/22 1600  AST 18  ALT 16  ALKPHOS 57  BILITOT 0.7  PROT 6.5  ALBUMIN 3.6   No results for input(s): "LIPASE", "AMYLASE" in the last 168 hours. No results for input(s): "AMMONIA" in the last 168 hours. Coagulation Profile: Recent Labs  Lab 08/06/22 2051  INR 1.1   Cardiac Enzymes: No results for input(s): "CKTOTAL", "CKMB", "CKMBINDEX", "TROPONINI" in the last 168 hours. BNP (last 3 results) No results for input(s): "PROBNP" in the last 8760 hours. HbA1C: No results for input(s): "HGBA1C" in the last 72 hours. CBG: No results for input(s): "GLUCAP" in the last 168 hours. Lipid Profile: No results for input(s): "CHOL", "HDL", "LDLCALC", "TRIG",  "CHOLHDL", "LDLDIRECT" in the last 72 hours. Thyroid Function Tests: No results for input(s): "TSH", "T4TOTAL", "FREET4", "T3FREE", "THYROIDAB" in the last 72 hours.  Anemia Panel: No results for input(s): "VITAMINB12", "FOLATE", "FERRITIN", "TIBC", "IRON", "RETICCTPCT" in the last 72 hours.  Sepsis Labs: No results for input(s): "PROCALCITON", "LATICACIDVEN" in the last 168 hours.  No results found for this or any previous visit (from the past 240 hour(s)).   Radiology Studies: No results found.  Scheduled Meds:  apixaban  5 mg Oral BID   finasteride  5 mg Oral Daily   metoprolol tartrate  12.5 mg Oral BID   pantoprazole  40 mg Oral Daily   sodium chloride flush  3 mL Intravenous Q12H   tamsulosin  0.4 mg Oral QPC supper   Continuous Infusions:  sodium chloride       LOS: 0 days    Time spent:  35 mins    Willeen Niece, MD [facility]   If 7PM-7AM, please contact night-coverage

## 2022-08-10 NOTE — Progress Notes (Signed)
Occupational Therapy Treatment Patient Details Name: Phillip Shelton MRN: [id number] DOB: [birth year] Today's Date: 08/10/2022   History of present illness Pt is an 88yo male presnting to [facility] ED with complaints of fatigue, weakness, and feeling off balance; found to be in new Afib. Of note recent bilateral knee drainage 7/31. PMH: BPH, GERD, HLD, OAB, depression, insomnia, R-THA,cortisone shots to bilateral shoulders   OT comments  Patient was noted to make progress towards goals with patient able to move better with walker to complete simulated toileting tasks. Patient worked on standing balance EOB with education on weight shifting forwards to reduce posterior leaning. Patient would continue to benefit from skilled OT services at this time while admitted and after d/c to address noted deficits in order to improve overall safety and independence in ADLs. Patient will benefit from continued inpatient follow up therapy, <3 hours/day     Recommendations for follow up therapy are one component of a multi-disciplinary discharge planning process, led by the attending physician.  Recommendations may be updated based on patient status, additional functional criteria and insurance authorization.    Assistance Recommended at Discharge Frequent or constant Supervision/Assistance  Patient can return home with the following  A little help with walking and/or transfers;A lot of help with bathing/dressing/bathroom;Assistance with cooking/housework;Direct supervision/assist for medications management;Assist for transportation;Help with stairs or ramp for entrance;Direct supervision/assist for financial management   Equipment Recommendations  None recommended by OT    Recommendations for Other Services      Precautions / Restrictions Precautions Precautions: Fall Restrictions Weight Bearing Restrictions: No       Mobility Bed Mobility Overal bed mobility: Modified Independent                              ADL either performed or assessed with clinical judgement   ADL Overall ADL's : Needs assistance/impaired                         Toilet Transfer: Min guard;Ambulation;Rolling walker (2 wheels) Toilet Transfer Details (indicate cue type and reason): with increased time.           General ADL Comments: patient reported he wanted to work on balance we worked on balance standing edge of bed with education on attempting to put some weight/pressure through front of feet to reduce pressure and posterior leaning through heels. patient was unable to shift trunk fowards with increased cues verbal and tactile. patient was able to stand with no BUE support for about 30 seconds with alterntating pressure through front of feet and heels. patient was educated on small gains that he has made since admission. patient was also educated on sitting to urinate v.s. standing for safety. patient verbalized understanding.      Cognition Arousal/Alertness: Awake/alert Behavior During Therapy: WFL for tasks assessed/performed Overall Cognitive Status: Within Functional Limits for tasks assessed         General Comments: patient was noted to be a little repetative at times. patient was down about his current situation and reported that he hoped he did not wake up. MD and nurse made wawre via secure chat. patients thoughts and feelings were heard and validated. patient was educated on how it was good to focus on small wins now to improve outlook and continue with positive mindset towards participation in therapy to hopefully be able to progress home. patient expressed stressors about wife and her medical status  as well. patient declined to speak with chaplin at this time.                   Pertinent Vitals/ Pain       Pain Assessment Pain Assessment: No/denies pain         Frequency  Min 1X/week        Progress Toward Goals  OT Goals(current goals can now be found  in the care plan section)  Progress towards OT goals: Progressing toward goals     Plan Discharge plan remains appropriate       AM-PAC OT "6 Clicks" Daily Activity     Outcome Measure   Help from another person eating meals?: None Help from another person taking care of personal grooming?: A Little Help from another person toileting, which includes using toliet, bedpan, or urinal?: A Little Help from another person bathing (including washing, rinsing, drying)?: A Lot Help from another person to put on and taking off regular upper body clothing?: A Little Help from another person to put on and taking off regular lower body clothing?: A Lot 6 Click Score: 17    End of Session Equipment Utilized During Treatment: Gait belt;Rolling walker (2 wheels)  OT Visit Diagnosis: Unsteadiness on feet (R26.81);Other abnormalities of gait and mobility (R26.89)   Activity Tolerance Patient tolerated treatment well   Patient Left in bed;with call bell/phone within reach;with bed alarm set   Nurse Communication Other (comment) (concerns over patients depressive view over current status)        Time: 9629-5284 OT Time Calculation (min): 51 min  Charges: OT General Charges $OT Visit: 1 Visit OT Treatments $Self Care/Home Management : 38-52 mins  Rosalio Loud, MS Acute Rehabilitation Department Office# [phone]   Selinda Flavin 08/10/2022, 4:09 PM

## 2022-08-10 NOTE — Progress Notes (Signed)
Mobility Specialist - Progress Note   08/10/22 1113  Mobility  Activity Ambulated with assistance in hallway  Level of Assistance Contact guard assist, steadying assist  Assistive Device Front wheel walker  Distance Ambulated (ft) 200 ft  Range of Motion/Exercises Active  Activity Response Tolerated well  Mobility Referral Yes  $Mobility charge 1 Mobility  Mobility Specialist Start Time (ACUTE ONLY) 1050  Mobility Specialist Stop Time (ACUTE ONLY) 1110  Mobility Specialist Time Calculation (min) (ACUTE ONLY) 20 min   Pt was found in bed and agreeable to ambulate. No complaints with session. At EOS returned to sit EOB with all needs met. Call bell in reach and wife in room.  Billey Chang Mobility Specialist

## 2022-08-11 ENCOUNTER — Inpatient Hospital Stay (HOSPITAL_COMMUNITY): Payer: Medicare Other

## 2022-08-11 DIAGNOSIS — R531 Weakness: Secondary | ICD-10-CM

## 2022-08-11 DIAGNOSIS — I4891 Unspecified atrial fibrillation: Secondary | ICD-10-CM | POA: Diagnosis not present

## 2022-08-11 DIAGNOSIS — E871 Hypo-osmolality and hyponatremia: Secondary | ICD-10-CM | POA: Diagnosis not present

## 2022-08-11 NOTE — Progress Notes (Signed)
Physical Therapy Treatment Patient Details Name: Phillip Shelton MRN: [id number] DOB: 20-Feb-1934 Today's Date: 08/11/2022   History of Present Illness Pt is an 88yo male presnting to [facility] ED with complaints of fatigue, weakness, and feeling off balance; found to be in new Afib. Of note recent bilateral knee drainage 7/31. PMH: BPH, GERD, HLD, OAB, depression, insomnia, R-THA,cortisone shots to bilateral shoulders    PT Comments  General Comments: AxO x 3 good historian currently living home alone as Spouse is in a SNF. "mornings are the most difficult for me with moving" (due to weakness/joint deformities), reported Pt. Assisted OOB required increased time and effort.  General transfer comment: depending on surface level, pt required Min to [employer] Assist with heavy use of B UE's for support.  Pt also presents with initial posterior LOB and delayed self correction.  Required 3 attempts from recliner before completion due to balance instability and weakness. Pt also presents with Maurice Small (slow moving) and delayed corrective responce. During stand to sit, pt presents with uncontrolled heavy "plop" with c/o B LE weakness. Requires safety with turns as pt presents with poor forward flexed posture and short shuffled steps.   HIGH FALL RISK.  Assisted with amb in hallway was challenging.  General Gait Details: VERY unsteady gait with excessive lean on walker due to B LE weakness and knee deformities of Genu Valgus.  Steps and shuffled with very little floor clearance.  Steps are uneven with decreased stance time R vs L.  Posture is poor forward flexed upper body and B hips and knees flexed.  Trial amb without walker resulted in a near fall due to B LE weakness and poor self coorection responce present with Maurice Small, Therapist recovered balance.  Increased LOB with side stepping and backward gait also resulted in a near fall, Therapist recovered.  Returned to room and performed a BERG balance test.  Pt scored VERY low 12/56 indicating 100% FALL RISK.  See BERG balance section below.  Pt will need ST Rehab at SNF to address mobility and functional decline prior to safely returning home.    If plan is discharge home, recommend the following: A little help with walking and/or transfers;A little help with bathing/dressing/bathroom;Assistance with cooking/housework;Assist for transportation;Help with stairs or ramp for entrance   Can travel by private vehicle     Yes  Equipment Recommendations  Rolling walker (2 wheels)    Recommendations for Other Services       Precautions / Restrictions Precautions Precautions: Fall Precaution Comments: Hx falls, RA B shoulders, B knees (Genu Valgus deformity), B feet Restrictions Weight Bearing Restrictions: No     Mobility  Bed Mobility Overal bed mobility: Needs Assistance Bed Mobility: Supine to Sit     Supine to sit: Supervision     General bed mobility comments: pt self able but requires increased time with Max c/o weakness plus several attempts to complete.    Transfers Overall transfer level: Needs assistance Equipment used: None, Rolling walker (2 wheels) Transfers: Sit to/from Stand Sit to Stand: Contact guard assist, Min assist           General transfer comment: depending on surface level, pt required Min to [employer] Assist with heavy use of B UE's for support.  Pt also presents with initial posterior LOB and delayed self correction.  Required 3 attempts from recliner before completion due to balance instability and weakness. Pt also presents with Maurice Small (slow moving) and delayed corrective responce. During  stand to sit, pt presents with uncontrolled heavy "plop" with c/o B LE weakness. Requires safety with turns as pt presents with poor forward flexed posture and short shuffled steps.   HIGH FALL RISK.    Ambulation/Gait Ambulation/Gait assistance: Contact guard assist, Min assist, Mod assist Gait  Distance (Feet): 55 Feet Assistive device: Rolling walker (2 wheels), None, 1 person hand held assist Gait Pattern/deviations: Step-to pattern, Step-through pattern, Decreased step length - right, Decreased step length - left, Decreased stance time - right, Decreased stride length, Decreased dorsiflexion - right, Decreased dorsiflexion - left, Decreased weight shift to right, Knee flexed in stance - right, Knee flexed in stance - left, Shuffle, Staggering left, Trunk flexed, Narrow base of support       General Gait Details: VERY unsteady gait with excessive lean on walker due to B LE weakness and knee deformities of Genu Valgus.  Steps and shuffled with very little floor clearance.  Steps are uneven with decreased stance time R vs L.  Posture is poor forward flexed upper body and B hips and knees flexed.  Trial amb without walker resulted in a near fall due to B LE weakness and poor self coorection responce present with Maurice Small, Therapist recovered balance.  Increased LOB with side stepping and backward gait also resulted in a near fall, Therapist recovered.  Returned to room and performed a BERG balance test.   Stairs             Wheelchair Mobility     Tilt Bed    Modified Rankin (Stroke Patients Only)       Balance Overall balance assessment: Needs assistance   Sitting balance-Leahy Scale: Fair   Postural control:  (forward flexed) Standing balance support: Bilateral upper extremity supported Standing balance-Leahy Scale: Poor               High level balance activites: Turns, Backward walking, Side stepping High Level Balance Comments: all higher level balance activities resulted in a near fall, Pt was unable to self correct Standardized Balance Assessment Standardized Balance Assessment : Berg Balance Test Berg Balance Test Sit to Stand: Able to stand using hands after several tries Standing Unsupported: Needs several tries to stand 30 seconds  unsupported Sitting with Back Unsupported but Feet Supported on Floor or Stool: Able to sit 2 minutes under supervision Stand to Sit: Sits independently, has uncontrolled descent Transfers: Needs one person to assist Standing Unsupported with Eyes Closed: Able to stand 3 seconds Standing Ubsupported with Feet Together: Needs help to attain position and unable to hold for 15 seconds From Standing, Reach Forward with Outstretched Arm: Reaches forward but needs supervision From Standing Position, Pick up Object from Floor: Unable to try/needs assist to keep balance From Standing Position, Turn to Look Behind Over each Shoulder: Needs supervision when turning Turn 360 Degrees: Needs assistance while turning Standing Unsupported, Alternately Place Feet on Step/Stool: Needs assistance to keep from falling or unable to try Standing Unsupported, One Foot in Front: Loses balance while stepping or standing Standing on One Leg: Unable to try or needs assist to prevent fall Total Score: 12/56 indicating 100% FALL RISK         Cognition Arousal: Alert Behavior During Therapy: [facility] for tasks assessed/performed Overall Cognitive Status: Within Functional Limits for tasks assessed  General Comments: AxO x 3 good historian currently living home alone as Spouse is in a SNF. "mornings are the most difficult for me with moving" (due to weakness/joint deformities), reported Pt.        Exercises      General Comments General comments (skin integrity, edema, etc.): Pt scored 12/56 indicating 100% FALL RISK      Pertinent Vitals/Pain Pain Assessment Pain Assessment: Faces Faces Pain Scale: Hurts a little bit Pain Location: Joints Pain Descriptors / Indicators: Aching, Discomfort Pain Intervention(s): Monitored during session       Prior Function   Pt lives home alone and was amb without a walker up until a few weeks ago.           PT Goals  (current goals can now be found in the care plan section) Progress towards PT goals: Progressing toward goals    Frequency    Min 1X/week      PT Plan Current plan remains appropriate    Co-evaluation              AM-PAC PT "6 Clicks" Mobility   Outcome Measure  Help needed turning from your back to your side while in a flat bed without using bedrails?: A Little Help needed moving from lying on your back to sitting on the side of a flat bed without using bedrails?: A Little Help needed moving to and from a bed to a chair (including a wheelchair)?: A Little Help needed standing up from a chair using your arms (e.g., wheelchair or bedside chair)?: A Little Help needed to walk in hospital room?: A Lot Help needed climbing 3-5 steps with a railing? : Total 6 Click Score: 15    End of Session Equipment Utilized During Treatment: Gait belt Activity Tolerance: Patient limited by fatigue Patient left: in chair;with call bell/phone within reach;with chair alarm set;with family/visitor present Nurse Communication: Mobility status PT Visit Diagnosis: Other abnormalities of gait and mobility (R26.89)     Time: 0935-1000 PT Time Calculation (min) (ACUTE ONLY): 25 min  Charges:    $Gait Training: 8-22 mins $Therapeutic Activity: 8-22 mins PT General Charges $$ ACUTE PT VISIT: 1 Visit                     Felecia Shelling  PTA Acute  Rehabilitation Services Office M-F          [phone]

## 2022-08-11 NOTE — Hospital Course (Signed)
88 years old Male with PMH significant for BPH,  GERD, Hyperlipidemia, overactive bladder, depression, insomnia presented in the ED with complaints of generalized weakness / fatigue and off balance for 1 week.  He called EMS after lunch because he was so weak, he was scared that he would fall and he was by himself.  He states his balance has gradually gotten worse. He has chronic lower extremity edema which he states is at baseline.  Patient's brother reports that he has progressive decline that is noticeable over last 1 month.  Workup in the ED reveals chest x-ray no acute findings.  CT head no acute findings.  EKG shows new onset A-fib.  Admitted for further evaluation.

## 2022-08-11 NOTE — TOC Progression Note (Addendum)
Transition of Care [facility]) - Progression Note    Patient Details  Name: Phillip Shelton MRN: [id number] Date of Birth: 01-16-1934  Transition of Care [facility]) CM/SW Contact  Howell Rucks, RN Phone Number: 08/11/2022, 10:15 AM  Clinical Narrative:  Teams chat from Dr Willeen Niece " p2P completed. He doesnt qualify for daily skilled therapy. They suggested Long term side( ALF) if he want to go. They declined SNF ". Team notified.   -1:35pm Met with pt and his brother at bedside, discussed denied request for short term rehab at SNF, both requesting appeal and agreed for hospital to initiate. Teams chat sent to Johnell Comings and Joanne Chars notify of appeals request. TOC will continue to follow.   -3:41pm Teams chat from [employer]- An expedited appeal has been submitted. A decision will be made within 72 hours. Someone from [facility]) will reach back out to me with a determination.   -9:04am (LATE ENTRY) Voicemail received from [facility] with [employer] on 08/11/22 at 3:54pm, confirmed denial of request for SNF. Appeal may be initiated at phone: [phone], fax [phone].   Expected Discharge Plan: Skilled Nursing Facility Barriers to Discharge: Continued Medical Work up  Expected Discharge Plan and Services   Discharge Planning Services: CM Consult   Living arrangements for the past 2 months: Single Family Home                                       Social Determinants of Health (SDOH) Interventions SDOH Screenings   Food Insecurity: No Food Insecurity (08/08/2022)  Housing: Low Risk  (08/08/2022)  Transportation Needs: No Transportation Needs (08/08/2022)  Utilities: Not At Risk (08/08/2022)  Alcohol Screen: Low Risk  (10/02/2021)  Depression (PHQ2-9): Low Risk  (07/21/2022)  Financial Resource Strain: Low Risk  (10/02/2021)  Physical Activity: Inactive (10/02/2021)  Social Connections: Moderately Isolated (10/02/2021)  Stress: No Stress Concern Present (10/02/2021)   Tobacco Use: Medium Risk (08/06/2022)    Readmission Risk Interventions     No data to display

## 2022-08-11 NOTE — Progress Notes (Signed)
Progress Note   Patient: Phillip Shelton DGU:[id number] DOB: Nov 21, 1934 DOA: 08/06/2022     1 DOS: the patient was seen and examined on 08/11/2022   Brief hospital course: 88 years old Male with PMH significant for BPH,  GERD, Hyperlipidemia, overactive bladder, depression, insomnia presented in the ED with complaints of generalized weakness / fatigue and off balance for 1 week.  He called EMS after lunch because he was so weak, he was scared that he would fall and he was by himself.  He states his balance has gradually gotten worse. He has chronic lower extremity edema which he states is at baseline.  Patient's brother reports that he has progressive decline that is noticeable over last 1 month.  Workup in the ED reveals chest x-ray no acute findings.  CT head no acute findings.  EKG shows new onset A-fib.  Admitted for further evaluation.   Assessment and Plan: New onset atrial fibrillation : Patient presented with generalized weakness, unsteadiness, and fatigue. EKG showed A-fib with NVR. BNP 128 Heart rate is well-controlled.  Patient is started on Eliquis 5 mg twice daily. 2D echo showed LVEF 50 to 55%.  Slightly reduced from 2020. Continue metoprolol 12.5 mg twice daily, Eliquis 5 mg BID. -remains rate controlled   Hyponatremia: Patient has history of hyponatremia No exogenous losses and states eating and drinking well. TSH and BMP normal. -Na stable at 130 this AM -Urine Na 71, urine osm 526, serum osm 290   Generalized Weakness: Likely multifactorial as brother states he has been declining over past 6 months. New onset atrial fibrillation could be playing a role. Patient also seems depressed , as wife moved out to nursing home last week and he is living alone.   PT recs for SNF, TOC following. Initially denied by insurance, family wishes to appeal. Of note, pt reported by PT to have extremely poor balance and is a major fall risk, suspect related to OA   Ambulatory  dysfunction: could be due to hyponatremia, new onset A-fib. 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 PT/OT Evaluation> SNF per above   Normocytic anemia: History of anemia, baseline 12-14 B12/folate. Started on b12 back in January of 2024    GERD: Continue Pantoprazole 40 mg po daily.   BPH (benign prostatic hyperplasia) Continue his rapaflo and proscar daily.   Pure hypercholesterolemia Statin myopathy.    Subjective: Feels very unsteady and afraid he will fall if he gets up  Physical Exam: Vitals:   08/10/22 1944 08/11/22 0610 08/11/22 1431 08/11/22 1431  BP: 135/79 131/79 124/65 124/65  Pulse: 80 69 81 87  Resp:   18 18  Temp: 97.6 F (36.4 C) 97.9 F (36.6 C) 97.9 F (36.6 C) 97.9 F (36.6 C)  TempSrc: Oral Oral Oral Oral  SpO2: 100% 94% 98% 100%  Weight:      Height:       General exam: Awake, laying in bed, in nad Respiratory system: Normal respiratory effort, no wheezing Cardiovascular system: regular rate, s1, s2 Gastrointestinal system: Soft, nondistended, positive BS Central nervous system: CN2-12 grossly intact, strength intact Extremities: Perfused, no clubbing Skin: Normal skin turgor, no notable skin lesions seen Psychiatry: Mood normal // no visual hallucinations   Data Reviewed:  Labs reviewed: Na 130, K 3.7, Cr 0.67, WBC 5.8, Hgb 10.9  Family Communication: Pt in room, family at bedside  Disposition: Status is: Inpatient Remains inpatient appropriate because: Severity of illness  Planned Discharge Destination: Skilled nursing facility  Author: Rickey Barbara, MD 08/11/2022 5:15 PM  For on call review [email or web address].

## 2022-08-11 NOTE — Plan of Care (Signed)
  Problem: Clinical Measurements: Goal: Ability to maintain clinical measurements within normal limits will improve Outcome: Progressing Goal: Diagnostic test results will improve Outcome: Progressing   Problem: Activity: Goal: Risk for activity intolerance will decrease Outcome: Progressing   Problem: Pain Managment: Goal: General experience of comfort will improve Outcome: Progressing   Problem: Safety: Goal: Ability to remain free from injury will improve Outcome: Progressing

## 2022-08-11 NOTE — Plan of Care (Signed)
  Problem: Health Behavior/Discharge Planning: Goal: Ability to manage health-related needs will improve Outcome: Progressing   Problem: Activity: Goal: Risk for activity intolerance will decrease Outcome: Progressing   Problem: Pain Managment: Goal: General experience of comfort will improve Outcome: Progressing   Problem: Safety: Goal: Ability to remain free from injury will improve Outcome: Progressing   

## 2022-08-12 DIAGNOSIS — I4891 Unspecified atrial fibrillation: Secondary | ICD-10-CM | POA: Diagnosis not present

## 2022-08-12 DIAGNOSIS — E871 Hypo-osmolality and hyponatremia: Secondary | ICD-10-CM | POA: Diagnosis not present

## 2022-08-12 DIAGNOSIS — R531 Weakness: Secondary | ICD-10-CM | POA: Diagnosis not present

## 2022-08-12 NOTE — TOC Progression Note (Signed)
Transition of Care [facility]) - Progression Note    Patient Details  Name: Phillip Shelton MRN: [id number] Date of Birth: [birth year]  Transition of Care [facility]) CM/SW Contact  Howell Rucks, RN Phone Number: 08/12/2022, 1:24 PM  Clinical Narrative: Teams chat received from Joanne Chars, updated MD, OT/PT notes sent to insurance today per request. Await appeal determination.        Expected Discharge Plan: Skilled Nursing Facility Barriers to Discharge: Continued Medical Work up  Expected Discharge Plan and Services   Discharge Planning Services: CM Consult   Living arrangements for the past 2 months: Single Family Home                                       Social Determinants of Health (SDOH) Interventions SDOH Screenings   Food Insecurity: No Food Insecurity (08/08/2022)  Housing: Low Risk  (08/08/2022)  Transportation Needs: No Transportation Needs (08/08/2022)  Utilities: Not At Risk (08/08/2022)  Alcohol Screen: Low Risk  (10/02/2021)  Depression (PHQ2-9): Low Risk  (07/21/2022)  Financial Resource Strain: Low Risk  (10/02/2021)  Physical Activity: Inactive (10/02/2021)  Social Connections: Moderately Isolated (10/02/2021)  Stress: No Stress Concern Present (10/02/2021)  Tobacco Use: Medium Risk (08/06/2022)    Readmission Risk Interventions     No data to display

## 2022-08-12 NOTE — Progress Notes (Signed)
Progress Note   Patient: Phillip Shelton [id number] DOB: September 04, 1934 DOA: 08/06/2022     2 DOS: the patient was seen and examined on 08/12/2022   Brief hospital course: 88 years old Male with PMH significant for BPH,  GERD, Hyperlipidemia, overactive bladder, depression, insomnia presented in the ED with complaints of generalized weakness / fatigue and off balance for 1 week.  He called EMS after lunch because he was so weak, he was scared that he would fall and he was by himself.  He states his balance has gradually gotten worse. He has chronic lower extremity edema which he states is at baseline.  Patient's brother reports that he has progressive decline that is noticeable over last 1 month.  Workup in the ED reveals chest x-ray no acute findings.  CT head no acute findings.  EKG shows new onset A-fib.  Admitted for further evaluation.   Assessment and Plan: New onset atrial fibrillation : Patient presented with generalized weakness, unsteadiness, and fatigue. EKG showed A-fib with NVR. BNP 128 Heart rate is well-controlled.  Patient is started on Eliquis 5 mg twice daily. 2D echo showed LVEF 50 to 55%.  Slightly reduced from 2020. Continue metoprolol 12.5 mg twice daily, Eliquis 5 mg BID. -remains rate controlled   Hyponatremia: Patient has history of hyponatremia No exogenous losses and states eating and drinking well. TSH and BMP normal. -Urine Na 71, urine osm 526, serum osm 290 -Na normalized    Generalized Weakness: Likely multifactorial as brother states he has been declining over past 6 months. New onset atrial fibrillation could be playing a role. Patient also seems depressed , as wife moved out to nursing home last week and he is living alone.   PT recs for SNF, TOC following. Initially denied by insurance, family wishes to appeal. Of note, pt reported by PT to have extremely poor balance and is a major fall risk, suspect related to OA -Pending appeal decision    Ambulatory dysfunction: could be due to hyponatremia, new onset A-fib. 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 PT/OT Evaluation> SNF per above   Normocytic anemia: History of anemia, baseline 12-14 B12/folate. Started on b12 back in January of 2024    GERD: Continue Pantoprazole 40 mg po daily.   BPH (benign prostatic hyperplasia) Continue his rapaflo and proscar daily.   Pure hypercholesterolemia Statin myopathy.    Subjective: Without complaints this AM  Physical Exam: Vitals:   08/11/22 2026 08/12/22 0508 08/12/22 1134 08/12/22 1220  BP: 112/66 102/62 124/65 105/69  Pulse: 78 84 79 78  Resp: 20 18  16   Temp: 98.4 F (36.9 C) 97.6 F (36.4 C)  (!) 97.5 F (36.4 C)  TempSrc: Oral Oral  Oral  SpO2: 98% 98%  98%  Weight:      Height:       General exam: Conversant, in no acute distress Respiratory system: normal chest rise, clear, no audible wheezing Cardiovascular system: regular rhythm, s1-s2 Gastrointestinal system: Nondistended, nontender, pos BS Central nervous system: No seizures, no tremors Extremities: No cyanosis, no joint deformities Skin: No rashes, no pallor Psychiatry: Affect normal // mood seems normal  Data Reviewed:  Labs reviewed: Na 135, K 3.9, Cr 0.71, WBC 7.6, Hgb 10.7  Family Communication: Pt in room, family not at bedside  Disposition: Status is: Inpatient Remains inpatient appropriate because: Severity of illness  Planned Discharge Destination: Skilled nursing facility     Author: Rickey Barbara, MD 08/12/2022 6:31 PM  For on call  review [email or web address].

## 2022-08-13 DIAGNOSIS — E871 Hypo-osmolality and hyponatremia: Secondary | ICD-10-CM | POA: Diagnosis not present

## 2022-08-13 DIAGNOSIS — I4891 Unspecified atrial fibrillation: Secondary | ICD-10-CM | POA: Diagnosis not present

## 2022-08-13 DIAGNOSIS — R531 Weakness: Secondary | ICD-10-CM | POA: Diagnosis not present

## 2022-08-13 NOTE — TOC Progression Note (Addendum)
Transition of Care [facility]) - Progression Note    Patient Details  Name: Phillip Shelton MRN: [id number] Date of Birth: 1934/07/01  Transition [facility]) CM/SW Contact  Howell Rucks, RN Phone Number: 08/13/2022, 2:25 PM  Clinical Narrative: Teams chat from Joanne Chars "  Appeal has been Overturned [place] approved 8/9 - 8/13, next review date is 8/13, Vesta Mixer id [id number], plan auth id [id number]." Team notified.   -2:49pm Call to [facility], informed of overturn of denial, reports room will be available tomorrow. RM 205, call main facility number at [phone], ask for nurse to give report. Pt and family notified.  -3:40pm Met with pt and family at bedside, informed of denial overturn and plan for dc to [facility] tomorrow, voiced understanding, no questions/concerns. TOC will continue to follow.      Expected Discharge Plan: Skilled Nursing Facility Barriers to Discharge: Continued Medical Work up  Expected Discharge Plan and Services   Discharge Planning Services: CM Consult   Living arrangements for the past 2 months: Single Family Home                                       Social Determinants of Health (SDOH) Interventions SDOH Screenings   Food Insecurity: No Food Insecurity (08/08/2022)  Housing: Low Risk  (08/08/2022)  Transportation Needs: No Transportation Needs (08/08/2022)  Utilities: Not At Risk (08/08/2022)  Alcohol Screen: Low Risk  (10/02/2021)  Depression (PHQ2-9): Low Risk  (07/21/2022)  Financial Resource Strain: Low Risk  (10/02/2021)  Physical Activity: Inactive (10/02/2021)  Social Connections: Moderately Isolated (10/02/2021)  Stress: No Stress Concern Present (10/02/2021)  Tobacco Use: Medium Risk (08/06/2022)    Readmission Risk Interventions     No data to display

## 2022-08-13 NOTE — Plan of Care (Signed)

## 2022-08-13 NOTE — Progress Notes (Signed)
Progress Note   Patient: Phillip Shelton WUJ:[id number] DOB: [birth year] DOA: 08/06/2022     3 DOS: the patient was seen and examined on 08/13/2022   Brief hospital course: 88 years old Male with PMH significant for BPH,  GERD, Hyperlipidemia, overactive bladder, depression, insomnia presented in the ED with complaints of generalized weakness / fatigue and off balance for 1 week.  He called EMS after lunch because he was so weak, he was scared that he would fall and he was by himself.  He states his balance has gradually gotten worse. He has chronic lower extremity edema which he states is at baseline.  Patient's brother reports that he has progressive decline that is noticeable over last 1 month.  Workup in the ED reveals chest x-ray no acute findings.  CT head no acute findings.  EKG shows new onset A-fib.  Admitted for further evaluation.   Assessment and Plan: New onset atrial fibrillation : Patient presented with generalized weakness, unsteadiness, and fatigue. EKG showed A-fib with NVR. BNP 128 Heart rate is well-controlled.  Patient is started on Eliquis 5 mg twice daily. 2D echo showed LVEF 50 to 55%.  Slightly reduced from 2020. Continue metoprolol 12.5 mg twice daily, Eliquis 5 mg BID. -remains rate controlled   Hyponatremia: Patient has history of hyponatremia No exogenous losses and states eating and drinking well. TSH and BMP normal. -Urine Na 71, urine osm 526, serum osm 290 -Na overall improved   Generalized Weakness: Likely multifactorial as brother states he has been declining over past 6 months. New onset atrial fibrillation could be playing a role. Patient also seems depressed , as wife moved out to nursing home last week and he is living alone.   PT recs for SNF, TOC following. Initially denied by insurance, family wishes to appeal. Of note, pt reported by PT to have extremely poor balance and is a major fall risk, suspect related to OA -Plan for SNF   Ambulatory  dysfunction: could be due to hyponatremia, new onset A-fib. 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 PT/OT Evaluation> SNF per above   Normocytic anemia: History of anemia, baseline 12-14 B12/folate. Started on b12 back in January of 2024    GERD: Continue Pantoprazole 40 mg po daily.   BPH (benign prostatic hyperplasia) Continue his rapaflo and proscar daily.   Pure hypercholesterolemia Statin myopathy.    Subjective: Very eager to be discharged soon  Physical Exam: Vitals:   08/12/22 1220 08/12/22 2028 08/13/22 0516 08/13/22 1138  BP: 105/69 106/61 112/69 123/73  Pulse: 78 94 71 84  Resp: 16 16 18 18   Temp: (!) 97.5 F (36.4 C) 98.2 F (36.8 C) 97.7 F (36.5 C) 97.8 F (36.6 C)  TempSrc: Oral Oral Oral Oral  SpO2: 98% 98% 98% 99%  Weight:      Height:       General exam: Awake, laying in bed, in nad Respiratory system: Normal respiratory effort, no wheezing Cardiovascular system: regular rate, s1, s2 Gastrointestinal system: Soft, nondistended, positive BS Central nervous system: CN2-12 grossly intact, strength intact Extremities: Perfused, no clubbing Skin: Normal skin turgor, no notable skin lesions seen Psychiatry: Mood normal // no visual hallucinations   Data Reviewed:  Labs reviewed: Na Na 130, K 3.7, Cr 0.78, WBC 5.6, 10.7  Family Communication: Pt in room, family at bedside  Disposition: Status is: Inpatient Remains inpatient appropriate because: Severity of illness  Planned Discharge Destination: Skilled nursing facility     Author: Rickey Barbara,  MD 08/13/2022 5:14 PM  For on call review [email or web address].

## 2022-08-13 NOTE — Plan of Care (Signed)
Pt progressing

## 2022-08-13 NOTE — Progress Notes (Signed)
Assumed care of the pt.Pt condition stable at this time. No changes in initial AM assessment at this time

## 2022-08-13 NOTE — Progress Notes (Signed)
Physical Therapy Treatment Patient Details Name: Phillip Shelton MRN: [id number] DOB: [birth year] Today's Date: 08/13/2022   History of Present Illness Pt is an 88yo male presnting to [facility] ED with complaints of fatigue, weakness, and feeling off balance; found to be in new Afib. Of note recent bilateral knee drainage 7/31. PMH: BPH, GERD, HLD, OAB, depression, insomnia, R-THA,cortisone shots to bilateral shoulders    PT Comments  General Comments: AxO x 3 good historian currently living home alone as Spouse is in a SNF. Assisted to bathroom was difficult.  General transfer comment: Required much assist to rise from bed level with initial posterior lean and poor/delayed self correction.  Unsteady.  VC's for proper hand placement.  Also assisted with a toilet transfer.  Increased asisst to safely complete turns due to LOB during side steps/backward gait.  HIGH FALL RISK  Assisted with peri care as pt was unable to sustain a safe static standing balance as attempt to self perform.  Heavy need for walker for balance.  General Gait Details: limited distance amb to and from bathroom only due to increased c/o fatigue this afternoon.  Posture is poor.  Forward flexed.  B knees are present with Genu Valgus and pt c/o painful feet.  HIGH FALL RISK. Pt will need ST Rehab at SNF to address mobility and functional decline prior to safely returning home.    If plan is discharge home, recommend the following: A little help with walking and/or transfers;A little help with bathing/dressing/bathroom;Assistance with cooking/housework;Assist for transportation;Help with stairs or ramp for entrance   Can travel by private vehicle     Yes  Equipment Recommendations       Recommendations for Other Services       Precautions / Restrictions Precautions Precautions: Fall Precaution Comments: Hx falls, RA B shoulders, B knees (Genu Valgus deformity), B feet Restrictions Weight Bearing Restrictions: No      Mobility  Bed Mobility               General bed mobility comments: Pt sitting EOB on arrival    Transfers Overall transfer level: Needs assistance Equipment used: None, Rolling walker (2 wheels)   Sit to Stand: Contact guard assist, Min assist           General transfer comment: Required much assist to rise from bed level with initial posterior lean and poor/delayed self correction.  Unsteady.  VC's for proper hand placement.  Also assisted with a toilet transfer.  Increased asisst to safely complete turns due to LOB during side steps/backward gait.  HIGH FALL RISK    Ambulation/Gait Ambulation/Gait assistance: Contact guard assist, Min assist, Mod assist Gait Distance (Feet): 16 Feet Assistive device: Rolling walker (2 wheels) Gait Pattern/deviations: Step-to pattern, Step-through pattern, Decreased step length - right, Decreased step length - left, Decreased stance time - right, Decreased stride length, Decreased dorsiflexion - right, Decreased dorsiflexion - left, Decreased weight shift to right, Knee flexed in stance - right, Knee flexed in stance - left, Shuffle, Staggering left, Trunk flexed, Narrow base of support Gait velocity: decreased     General Gait Details: limited distance amb to and from bathroom only due to increased c/o fatigue this afternoon.  Posture is poor.  Forward flexed.  B knees are present with Genu Valgus and pt c/o painful feet.  HIGH FALL RISK.   Stairs             Wheelchair Mobility     Tilt Bed  Modified Rankin (Stroke Patients Only)       Balance                                            Cognition Arousal: Alert Behavior During Therapy: WFL for tasks assessed/performed Overall Cognitive Status: Within Functional Limits for tasks assessed                                 General Comments: AxO x 3 good historian currently living home alone as Spouse is in a SNF.        Exercises       General Comments        Pertinent Vitals/Pain Pain Assessment Pain Assessment: No/denies pain Pain Location: Joints Pain Descriptors / Indicators: Aching, Discomfort    Home Living                          Prior Function            PT Goals (current goals can now be found in the care plan section) Progress towards PT goals: Progressing toward goals    Frequency    Min 1X/week      PT Plan Current plan remains appropriate    Co-evaluation              AM-PAC PT "6 Clicks" Mobility   Outcome Measure  Help needed turning from your back to your side while in a flat bed without using bedrails?: A Little Help needed moving from lying on your back to sitting on the side of a flat bed without using bedrails?: A Little Help needed moving to and from a bed to a chair (including a wheelchair)?: A Little Help needed standing up from a chair using your arms (e.g., wheelchair or bedside chair)?: A Little Help needed to walk in hospital room?: A Lot Help needed climbing 3-5 steps with a railing? : Total 6 Click Score: 15    End of Session Equipment Utilized During Treatment: Gait belt Activity Tolerance: Patient limited by fatigue Patient left: in bed;with call bell/phone within reach Nurse Communication: Mobility status PT Visit Diagnosis: Other abnormalities of gait and mobility (R26.89)     Time: 7829-5621 PT Time Calculation (min) (ACUTE ONLY): 25 min  Charges:    $Gait Training: 8-22 mins $Therapeutic Activity: 8-22 mins PT General Charges $$ ACUTE PT VISIT: 1 Visit                    Felecia Shelling  PTA Acute  Rehabilitation Services Office M-F          [phone]

## 2022-08-14 DIAGNOSIS — R2681 Unsteadiness on feet: Secondary | ICD-10-CM | POA: Insufficient documentation

## 2022-08-14 DIAGNOSIS — E7800 Pure hypercholesterolemia, unspecified: Secondary | ICD-10-CM | POA: Diagnosis not present

## 2022-08-14 DIAGNOSIS — R4181 Age-related cognitive decline: Secondary | ICD-10-CM | POA: Insufficient documentation

## 2022-08-14 DIAGNOSIS — K219 Gastro-esophageal reflux disease without esophagitis: Secondary | ICD-10-CM | POA: Diagnosis not present

## 2022-08-14 DIAGNOSIS — R279 Unspecified lack of coordination: Secondary | ICD-10-CM | POA: Insufficient documentation

## 2022-08-14 DIAGNOSIS — I4891 Unspecified atrial fibrillation: Secondary | ICD-10-CM | POA: Diagnosis not present

## 2022-08-14 DIAGNOSIS — R531 Weakness: Secondary | ICD-10-CM | POA: Diagnosis not present

## 2022-08-14 DIAGNOSIS — R2689 Other abnormalities of gait and mobility: Secondary | ICD-10-CM | POA: Diagnosis not present

## 2022-08-14 DIAGNOSIS — M6281 Muscle weakness (generalized): Secondary | ICD-10-CM | POA: Insufficient documentation

## 2022-08-14 DIAGNOSIS — G4700 Insomnia, unspecified: Secondary | ICD-10-CM | POA: Diagnosis not present

## 2022-08-14 DIAGNOSIS — L282 Other prurigo: Secondary | ICD-10-CM | POA: Insufficient documentation

## 2022-08-14 DIAGNOSIS — G720 Drug-induced myopathy: Secondary | ICD-10-CM | POA: Diagnosis not present

## 2022-08-14 DIAGNOSIS — D62 Acute posthemorrhagic anemia: Secondary | ICD-10-CM | POA: Diagnosis not present

## 2022-08-14 DIAGNOSIS — R262 Difficulty in walking, not elsewhere classified: Secondary | ICD-10-CM | POA: Diagnosis not present

## 2022-08-14 DIAGNOSIS — L299 Pruritus, unspecified: Secondary | ICD-10-CM | POA: Diagnosis not present

## 2022-08-14 DIAGNOSIS — E538 Deficiency of other specified B group vitamins: Secondary | ICD-10-CM | POA: Diagnosis not present

## 2022-08-14 DIAGNOSIS — Z743 Need for continuous supervision: Secondary | ICD-10-CM | POA: Diagnosis not present

## 2022-08-14 DIAGNOSIS — E785 Hyperlipidemia, unspecified: Secondary | ICD-10-CM | POA: Insufficient documentation

## 2022-08-14 DIAGNOSIS — R079 Chest pain, unspecified: Secondary | ICD-10-CM | POA: Diagnosis not present

## 2022-08-14 DIAGNOSIS — E871 Hypo-osmolality and hyponatremia: Secondary | ICD-10-CM | POA: Diagnosis not present

## 2022-08-14 DIAGNOSIS — Z7401 Bed confinement status: Secondary | ICD-10-CM | POA: Diagnosis not present

## 2022-08-14 DIAGNOSIS — R600 Localized edema: Secondary | ICD-10-CM | POA: Diagnosis not present

## 2022-08-14 MED ORDER — METOPROLOL TARTRATE 25 MG PO TABS: 12.5000 mg | ORAL_TABLET | Freq: Two times a day (BID) | ORAL | 0 refills | Status: AC

## 2022-08-14 MED ORDER — TAMSULOSIN HCL 0.4 MG PO CAPS: 0.4000 mg | ORAL_CAPSULE | Freq: Every day | ORAL | 0 refills | Status: AC

## 2022-08-14 MED ORDER — MIRTAZAPINE 7.5 MG PO TABS: 7.5000 mg | ORAL_TABLET | Freq: Every day | ORAL | 0 refills | Status: DC

## 2022-08-14 MED ORDER — APIXABAN 5 MG PO TABS: 5.0000 mg | ORAL_TABLET | Freq: Two times a day (BID) | ORAL | 0 refills | Status: DC

## 2022-08-14 NOTE — Discharge Summary (Signed)
Physician Discharge Summary   Patient: Phillip Shelton MRN: [id number] DOB: [birth year]  Admit date:     08/06/2022  Discharge date: 08/14/22  Discharge Physician: Rickey Barbara   PCP: Corwin Levins, MD   Recommendations at discharge:    Follow up with PCP in 1-2 weeks  Discharge Diagnoses: Principal Problem:   New onset atrial fibrillation (HCC) Active Problems:   Hyponatremia   Weakness   Ambulatory dysfunction   Normocytic anemia   GERD   BPH (benign prostatic hyperplasia)   Pure hypercholesterolemia  Resolved Problems:   OAB (overactive bladder)   Depression  Hospital Course: 88 years old Male with PMH significant for BPH,  GERD, Hyperlipidemia, overactive bladder, depression, insomnia presented in the ED with complaints of generalized weakness / fatigue and off balance for 1 week.  He called EMS after lunch because he was so weak, he was scared that he would fall and he was by himself.  He states his balance has gradually gotten worse. He has chronic lower extremity edema which he states is at baseline.  Patient's brother reports that he has progressive decline that is noticeable over last 1 month.  Workup in the ED reveals chest x-ray no acute findings.  CT head no acute findings.  EKG shows new onset A-fib.  Admitted for further evaluation.   Assessment and Plan: New onset atrial fibrillation : Patient presented with generalized weakness, unsteadiness, and fatigue. EKG showed A-fib with NVR. BNP 128 Heart rate is well-controlled.  Patient is started on Eliquis 5 mg twice daily. 2D echo showed LVEF 50 to 55%.  Slightly reduced from 2020. Continue metoprolol 12.5 mg twice daily, Eliquis 5 mg BID. -remains rate controlled   Hyponatremia: Patient has history of hyponatremia No exogenous losses and states eating and drinking well. TSH and BMP normal. -Urine Na 71, urine osm 526, serum osm 290 -Na improved   Generalized Weakness: Likely multifactorial as brother states  he has been declining over past 6 months. New onset atrial fibrillation could be playing a role. Patient also seems depressed , as wife moved out to nursing home last week and he is living alone.   PT recs for SNF, TOC following. Initially denied by insurance, now cleared after family appeal  Of note, pt reported by PT to have extremely poor balance and is a major fall risk, suspect related to OA   Ambulatory dysfunction: could be due to hyponatremia, new onset A-fib. 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 PT/OT Evaluation> SNF per above   Normocytic anemia: History of anemia, baseline 12-14 B12/folate. Started on b12 back in January of 2024    GERD: Continue PPI   BPH (benign prostatic hyperplasia) Continue his rapaflo and proscar daily.   Pure hypercholesterolemia Statin myopathy.      Consultants:  Procedures performed:   Disposition: Skilled nursing facility Diet recommendation:  Cardiac diet DISCHARGE MEDICATION: Allergies as of 08/14/2022       Reactions   Atorvastatin Other (See Comments)   "Made me feel badly and I could not sleep"        Medication List     STOP taking these medications    silodosin 8 MG Caps capsule Commonly known as: RAPAFLO Replaced by: tamsulosin 0.4 MG Caps capsule       TAKE these medications    apixaban 5 MG Tabs tablet Commonly known as: ELIQUIS Take 1 tablet (5 mg total) by mouth 2 (two) times daily.   cyanocobalamin 1000 MCG tablet  Commonly known as: VITAMIN B12 Take 1,000 mcg by mouth daily.   finasteride 5 MG tablet Commonly known as: Proscar Take 1 tablet (5 mg total) by mouth daily.   fish oil-omega-3 fatty acids 1000 MG capsule Take 1 g by mouth at bedtime.   metoprolol tartrate 25 MG tablet Commonly known as: LOPRESSOR Take 0.5 tablets (12.5 mg total) by mouth 2 (two) times daily.   mirtazapine 7.5 MG tablet Commonly known as: REMERON Take 1 tablet (7.5 mg total) by mouth at bedtime.    MULTIVITAMIN ADULTS PO Take 1 tablet by mouth daily with breakfast.   omeprazole 20 MG capsule Commonly known as: PRILOSEC Take 1 capsule (20 mg total) by mouth daily. What changed: when to take this   tamsulosin 0.4 MG Caps capsule Commonly known as: FLOMAX Take 1 capsule (0.4 mg total) by mouth daily after supper. Replaces: silodosin 8 MG Caps capsule   temazepam 15 MG capsule Commonly known as: RESTORIL TAKE 1 CAPSULE BY MOUTH AT BEDTIME AS NEEDED FOR SLEEP What changed: See the new instructions.   triamcinolone cream 0.1 % Commonly known as: KENALOG Apply 1 Application topically 2 (two) times daily as needed (for itching).   Tylenol 8 Hour Arthritis Pain 650 MG CR tablet Generic drug: acetaminophen Take 650 mg by mouth See admin instructions. Take 650 mg by mouth in the morning and afternoon        Follow-up Information     Corwin Levins, MD Follow up in 2 week(s).   Specialties: Internal Medicine, Radiology Why: Hospital follow up Contact information: [address] [place] [address] [phone]                Discharge Exam: Ceasar Mons Weights   08/06/22 2111  Weight: 77.6 kg   General exam: Awake, laying in bed, in nad Respiratory system: Normal respiratory effort, no wheezing Cardiovascular system: regular rate, s1, s2 Gastrointestinal system: Soft, nondistended, positive BS Central nervous system: CN2-12 grossly intact, strength intact Extremities: Perfused, no clubbing Skin: Normal skin turgor, no notable skin lesions seen Psychiatry: Mood normal // no visual hallucinations   Condition at discharge: fair  The results of significant diagnostics from this hospitalization (including imaging, microbiology, ancillary and laboratory) are listed below for reference.   Imaging Studies: US Abdomen Limited  Result Date: 08/11/2022 CLINICAL DATA:  Right lower quadrant abdominal swelling. EXAM: ULTRASOUND ABDOMEN LIMITED COMPARISON:  None  Available. FINDINGS: Ultrasonographic evaluation of the right lower quadrant of the abdomen demonstrates normal-appearing soft tissue structures, without evidence of a soft tissue mass, fluid collection or cystic lesion. No abnormal flow is seen within this region on color Doppler evaluation. No hernia is identified. IMPRESSION: Unremarkable ultrasonographic evaluation of the right lower quadrant. Electronically Signed   By: Aram Candela M.D.   On: 08/11/2022 19:59   ECHOCARDIOGRAM COMPLETE  Result Date: 08/07/2022    ECHOCARDIOGRAM REPORT   Patient Name:   Phillip Shelton Date of Exam: 08/07/2022 Medical Rec #:  [id number]       Height:       74.0 in Accession #:    [id number]      Weight:       171.1 lb Date of Birth:  06-Jul-1934      BSA:          2.034 m Patient Age:    87 years        BP:           117/83 mmHg Patient  Gender: M               HR:           108 bpm. Exam Location:  Inpatient Procedure: 2D Echo, Cardiac Doppler and Color Doppler Indications:    Atrial Fibrillation  History:        Patient has prior history of Echocardiogram examinations, most                 recent 11/24/2018. Arrythmias:Atrial Fibrillation,                 Signs/Symptoms:Fatigue; Risk Factors:Former Smoker.  Sonographer:    Wallie Char Referring Phys: [id number] ALLISON WOLFE IMPRESSIONS  1. Left ventricular ejection fraction, by estimation, is 50 to 55%. The left ventricle has low normal function. The left ventricle has no regional wall motion abnormalities. Left ventricular diastolic function could not be evaluated.  2. Right ventricular systolic function is normal. The right ventricular size is mildly enlarged. There is normal pulmonary artery systolic pressure.  3. The mitral valve is normal in structure. Trivial mitral valve regurgitation. No evidence of mitral stenosis.  4. The aortic valve is tricuspid. Aortic valve regurgitation is not visualized. No aortic stenosis is present.  5. Aortic dilatation noted. There is  mild dilatation of the ascending aorta, measuring 42 mm.  6. The inferior vena cava is dilated in size with <50% respiratory variability, suggesting right atrial pressure of 15 mmHg. Comparison(s): Changes from prior study are noted. LVEF is 50-55% now compared to 60-65% in 2020. FINDINGS  Left Ventricle: Left ventricular ejection fraction, by estimation, is 50 to 55%. The left ventricle has low normal function. The left ventricle has no regional wall motion abnormalities. The left ventricular internal cavity size was normal in size. There is no left ventricular hypertrophy. Left ventricular diastolic function could not be evaluated due to atrial fibrillation. Left ventricular diastolic function could not be evaluated. Right Ventricle: The right ventricular size is mildly enlarged. No increase in right ventricular wall thickness. Right ventricular systolic function is normal. There is normal pulmonary artery systolic pressure. The tricuspid regurgitant velocity is 2.28  m/s, and with an assumed right atrial pressure of 15 mmHg, the estimated right ventricular systolic pressure is 35.8 mmHg. Left Atrium: Left atrial size was normal in size. Right Atrium: Right atrial size was normal in size. Pericardium: There is no evidence of pericardial effusion. Mitral Valve: The mitral valve is normal in structure. Trivial mitral valve regurgitation. No evidence of mitral valve stenosis. MV peak gradient, 1.9 mmHg. The mean mitral valve gradient is 1.0 mmHg. Tricuspid Valve: The tricuspid valve is grossly normal. Tricuspid valve regurgitation is trivial. No evidence of tricuspid stenosis. Aortic Valve: The aortic valve is tricuspid. Aortic valve regurgitation is not visualized. No aortic stenosis is present. Aortic valve mean gradient measures 2.0 mmHg. Aortic valve peak gradient measures 3.3 mmHg. Aortic valve area, by VTI measures 4.41 cm. Pulmonic Valve: The pulmonic valve was not well visualized. Pulmonic valve  regurgitation is not visualized. No evidence of pulmonic stenosis. Aorta: The aortic root is normal in size and structure and aortic dilatation noted. There is mild dilatation of the ascending aorta, measuring 42 mm. Venous: The inferior vena cava is dilated in size with less than 50% respiratory variability, suggesting right atrial pressure of 15 mmHg. IAS/Shunts: No atrial level shunt detected by color flow Doppler.  LEFT VENTRICLE PLAX 2D LVIDd:         4.50 cm  Diastology LVIDs:         3.40 cm      LV e' medial:    10.20 cm/s LV PW:         1.00 cm      LV E/e' medial:  7.5 LV IVS:        1.00 cm      LV e' lateral:   12.40 cm/s LVOT diam:     2.50 cm      LV E/e' lateral: 6.1 LV SV:         74 LV SV Index:   37 LVOT Area:     4.91 cm  LV Volumes (MOD) LV vol d, MOD A2C: 75.4 ml LV vol d, MOD A4C: 108.0 ml LV vol s, MOD A2C: 33.9 ml LV vol s, MOD A4C: 48.4 ml LV SV MOD A2C:     41.5 ml LV SV MOD A4C:     108.0 ml LV SV MOD BP:      47.1 ml RIGHT VENTRICLE             IVC RV Basal diam:  4.20 cm     IVC diam: 2.50 cm RV S prime:     14.33 cm/s TAPSE (M-mode): 2.0 cm LEFT ATRIUM             Index        RIGHT ATRIUM           Index LA diam:        3.70 cm 1.82 cm/m   RA Area:     19.90 cm LA Vol (A2C):   63.2 ml 31.07 ml/m  RA Volume:   62.30 ml  30.63 ml/m LA Vol (A4C):   59.7 ml 29.35 ml/m LA Biplane Vol: 61.9 ml 30.43 ml/m  AORTIC VALVE AV Area (Vmax):    4.19 cm AV Area (Vmean):   4.22 cm AV Area (VTI):     4.41 cm AV Vmax:           90.58 cm/s AV Vmean:          65.650 cm/s AV VTI:            0.168 m AV Peak Grad:      3.3 mmHg AV Mean Grad:      2.0 mmHg LVOT Vmax:         77.38 cm/s LVOT Vmean:        56.475 cm/s LVOT VTI:          0.151 m LVOT/AV VTI ratio: 0.90  AORTA Ao Root diam: 3.70 cm Ao Asc diam:  4.20 cm MITRAL VALVE               TRICUSPID VALVE MV Area (PHT): 3.64 cm    TR Peak grad:   20.8 mmHg MV Area VTI:   5.01 cm    TR Vmax:        228.00 cm/s MV Peak grad:  1.9 mmHg MV Mean  grad:  1.0 mmHg    SHUNTS MV Vmax:       0.69 m/s    Systemic VTI:  0.15 m MV Vmean:      39.5 cm/s   Systemic Diam: 2.50 cm MV Decel Time: 208 msec MV E velocity: 76.10 cm/s Vishnu Priya Mallipeddi Electronically signed by Winfield Rast Mallipeddi Signature Date/Time: 08/07/2022/12:21:07 PM    Final    CT Head Wo Contrast  Result Date: 08/06/2022 CLINICAL DATA:  Gait issue.  Weakness and fatigue. EXAM: CT HEAD WITHOUT CONTRAST TECHNIQUE: Contiguous axial images were obtained from the base of the skull through the vertex without intravenous contrast. RADIATION DOSE REDUCTION: This exam was performed according to the departmental dose-optimization program which includes automated exposure control, adjustment of the mA and/or kV according to patient size and/or use of iterative reconstruction technique. COMPARISON:  None Available. FINDINGS: Brain: No intracranial hemorrhage, mass effect, or midline shift. Age related atrophy and chronic small vessel ischemia. No hydrocephalus. The basilar cisterns are patent. No evidence of territorial infarct or acute ischemia. Bilateral basal gangliar mineralization is typically senescent and of no clinical significance. No extra-axial or intracranial fluid collection. Vascular: Atherosclerosis of skullbase vasculature without hyperdense vessel or abnormal calcification. Skull: No skull fracture. Scattered arachnoid granulations, including large granulation in the right occipital bone. Sinuses/Orbits: Paranasal sinuses and mastoid air cells are clear. The visualized orbits are unremarkable. Other: None. IMPRESSION: 1. No acute intracranial abnormality. 2. Age related atrophy and chronic small vessel ischemia. Electronically Signed   By: Narda Rutherford M.D.   On: 08/06/2022 16:12   DG Chest 2 View  Result Date: 08/06/2022 CLINICAL DATA:  Weakness. EXAM: CHEST - 2 VIEW COMPARISON:  May 28, 2009. FINDINGS: The heart size and mediastinal contours are within normal limits. Both  lungs are clear. Degenerative changes are seen involving the glenohumeral joints bilaterally. IMPRESSION: No active cardiopulmonary disease. Electronically Signed   By: Lupita Raider M.D.   On: 08/06/2022 15:55    Microbiology: Results for orders placed or performed in visit on 08/01/18  Fecal occult blood, imunochemical([facility])     Status: None   Collection Time: 08/01/18 12:15 PM   Specimen: Stool  Result Value Ref Range Status   Fecal Occult Bld Negative Negative Final    Labs: CBC: Recent Labs  Lab 08/09/22 0439 08/10/22 0507 08/11/22 0444 08/12/22 0525 08/13/22 0501  WBC 6.0 6.2 5.8 7.6 5.6  HGB 10.8* 11.1* 10.9* 10.7* 10.7*  HCT 32.2* 33.1* 33.0* 32.4* 32.6*  MCV 92.3 92.2 92.4 93.9 91.8  PLT 210 230 228 228 233   Basic Metabolic Panel: Recent Labs  Lab 08/08/22 0611 08/09/22 0439 08/10/22 0507 08/11/22 0444 08/12/22 0525 08/13/22 0501  NA 131* 131* 130* 130* 135 130*  K 3.8 3.4* 3.9 3.7 3.9 3.7  CL 99 100 100 100 104 97*  CO2 25 23 23 22 25 25   GLUCOSE 93 92 92 100* 90 90  BUN 21 20 17 16 14 13   CREATININE 0.74 0.68 0.69 0.67 0.71 0.78  CALCIUM 8.6* 8.4* 8.6* 8.4* 8.4* 8.3*  MG 2.0 2.0  --  2.1  --   --   PHOS 3.8 3.0  --  3.1  --   --    Liver Function Tests: Recent Labs  Lab 08/12/22 0525 08/13/22 0501  AST 12* 12*  ALT 11 13  ALKPHOS 52 57  BILITOT 0.5 0.6  PROT 5.5* 5.9*  ALBUMIN 3.1* 3.1*   CBG: No results for input(s): "GLUCAP" in the last 168 hours.  Discharge time spent: less than 30 minutes.  Signed: Rickey Barbara, MD [facility] 08/14/2022

## 2022-08-14 NOTE — Progress Notes (Signed)
Mobility Specialist - Progress Note   08/14/22 1027  Mobility  Activity Ambulated with assistance in hallway  Level of Assistance Standby assist, set-up cues, supervision of patient - no hands on  Assistive Device Front wheel walker  Distance Ambulated (ft) 500 ft  Activity Response Tolerated well  Mobility Referral Yes  $Mobility charge 1 Mobility  Mobility Specialist Start Time (ACUTE ONLY) 1015  Mobility Specialist Stop Time (ACUTE ONLY) 1026  Mobility Specialist Time Calculation (min) (ACUTE ONLY) 11 min   Pt received in bed and agreeable to mobility. No complaints during session. Pt to bed after session with all needs met. Bed alarm turned back on.  [facility]

## 2022-08-14 NOTE — Plan of Care (Signed)
  Problem: Education: Goal: Understanding of medication regimen will improve Outcome: Progressing   Problem: Activity: Goal: Ability to tolerate increased activity will improve Outcome: Progressing   Problem: Nutrition: Goal: Adequate nutrition will be maintained Outcome: Progressing   Problem: Safety: Goal: Ability to remain free from injury will improve Outcome: Progressing

## 2022-08-14 NOTE — TOC Transition Note (Signed)
Transition of Care [facility]) - CM/SW Discharge Note   Patient Details  Name: Phillip Shelton MRN: [id number] Date of Birth: 06-21-1934  [facility]) CM/SW Contact:  Adrian Prows, RN Phone Number: 08/14/2022, 1:17 PM   Clinical Narrative:    D/C orders received; spoke w/ French Ana at [facility]; she gave room assignment # 205, call report # [phone]; SNF transfer report and d/c summary sent via SNF hub; pt and dtr notified; they agree to d/c plan; pt will transport by [facility]; [facility] called at 1321; spoke w/ operator # [id number]; no TOC needs.   Final next level of care: Skilled Nursing Facility Barriers to Discharge: No Barriers Identified   Patient Goals and CMS Choice      Discharge Placement                Patient chooses bed at: [facility], [place] Patient to be transferred to facility by: [facility] Name of family member notified: Riley Kill (dtr) [phone] Patient and family notified of of transfer: 08/14/22  Discharge Plan and Services Additional resources added to the After Visit Summary for     Discharge Planning Services: CM Consult                                 Social Determinants of Health (SDOH) Interventions SDOH Screenings   Food Insecurity: No Food Insecurity (08/08/2022)  Housing: Low Risk  (08/08/2022)  Transportation Needs: No Transportation Needs (08/08/2022)  Utilities: Not At Risk (08/08/2022)  Alcohol Screen: Low Risk  (10/02/2021)  Depression (PHQ2-9): Low Risk  (07/21/2022)  Financial Resource Strain: Low Risk  (10/02/2021)  Physical Activity: Inactive (10/02/2021)  Social Connections: Moderately Isolated (10/02/2021)  Stress: No Stress Concern Present (10/02/2021)  Tobacco Use: Medium Risk (08/06/2022)     Readmission Risk Interventions     No data to display

## 2022-08-23 ENCOUNTER — Encounter: Payer: Self-pay | Admitting: Internal Medicine

## 2022-08-23 DIAGNOSIS — F321 Major depressive disorder, single episode, moderate: Secondary | ICD-10-CM | POA: Insufficient documentation

## 2022-08-31 ENCOUNTER — Telehealth: Payer: Self-pay | Admitting: Internal Medicine

## 2022-08-31 NOTE — Telephone Encounter (Signed)
Called and gave verbals.

## 2022-08-31 NOTE — Telephone Encounter (Signed)
Caller [employer]:  Deidre from [employer] [phone]   Needs Verbal orders for what service & frequency:  Physical therapy for initial evaluation

## 2022-08-31 NOTE — Telephone Encounter (Signed)
Ok for verbals 

## 2022-09-01 DIAGNOSIS — I081 Rheumatic disorders of both mitral and tricuspid valves: Secondary | ICD-10-CM | POA: Diagnosis not present

## 2022-09-01 DIAGNOSIS — I77810 Thoracic aortic ectasia: Secondary | ICD-10-CM | POA: Diagnosis not present

## 2022-09-01 DIAGNOSIS — M170 Bilateral primary osteoarthritis of knee: Secondary | ICD-10-CM | POA: Diagnosis not present

## 2022-09-01 DIAGNOSIS — D649 Anemia, unspecified: Secondary | ICD-10-CM | POA: Diagnosis not present

## 2022-09-01 DIAGNOSIS — I4891 Unspecified atrial fibrillation: Secondary | ICD-10-CM | POA: Diagnosis not present

## 2022-09-02 ENCOUNTER — Telehealth: Payer: Self-pay | Admitting: Internal Medicine

## 2022-09-02 NOTE — Telephone Encounter (Signed)
Ok for verbals 

## 2022-09-02 NOTE — Telephone Encounter (Signed)
Caller [employer]:  Doug from [place]   Phone Number:[phone]   Needs Verbal orders for what service & frequency:  PT starting next week 2 week 3 and 1 week 5

## 2022-09-02 NOTE — Telephone Encounter (Signed)
Called and gave verbals.

## 2022-09-05 ENCOUNTER — Other Ambulatory Visit: Payer: Self-pay | Admitting: Internal Medicine

## 2022-09-08 ENCOUNTER — Telehealth: Payer: Self-pay | Admitting: Internal Medicine

## 2022-09-08 DIAGNOSIS — I4891 Unspecified atrial fibrillation: Secondary | ICD-10-CM | POA: Diagnosis not present

## 2022-09-08 DIAGNOSIS — I77810 Thoracic aortic ectasia: Secondary | ICD-10-CM | POA: Diagnosis not present

## 2022-09-08 DIAGNOSIS — D649 Anemia, unspecified: Secondary | ICD-10-CM | POA: Diagnosis not present

## 2022-09-08 DIAGNOSIS — I081 Rheumatic disorders of both mitral and tricuspid valves: Secondary | ICD-10-CM | POA: Diagnosis not present

## 2022-09-08 DIAGNOSIS — M170 Bilateral primary osteoarthritis of knee: Secondary | ICD-10-CM | POA: Diagnosis not present

## 2022-09-08 NOTE — Telephone Encounter (Signed)
HH ORDERS   Caller Name: [facility] Agency Name: Randolm Idol Phone #: [phone](secure)  Service Requested: nursing for lower extremity edema management; swelling in both legs (examples: OT/PT/Skilled Nursing/Social Work/Speech Therapy/Wound Care)

## 2022-09-08 NOTE — Telephone Encounter (Signed)
Ok for verbal 

## 2022-09-08 NOTE — Telephone Encounter (Signed)
Called and gave verbals.

## 2022-09-09 DIAGNOSIS — M170 Bilateral primary osteoarthritis of knee: Secondary | ICD-10-CM | POA: Diagnosis not present

## 2022-09-09 DIAGNOSIS — I081 Rheumatic disorders of both mitral and tricuspid valves: Secondary | ICD-10-CM | POA: Diagnosis not present

## 2022-09-09 DIAGNOSIS — D649 Anemia, unspecified: Secondary | ICD-10-CM | POA: Diagnosis not present

## 2022-09-09 DIAGNOSIS — I77810 Thoracic aortic ectasia: Secondary | ICD-10-CM | POA: Diagnosis not present

## 2022-09-09 DIAGNOSIS — I4891 Unspecified atrial fibrillation: Secondary | ICD-10-CM | POA: Diagnosis not present

## 2022-09-10 ENCOUNTER — Ambulatory Visit: Payer: Medicare Other | Admitting: Urgent Care

## 2022-09-11 DIAGNOSIS — M170 Bilateral primary osteoarthritis of knee: Secondary | ICD-10-CM | POA: Diagnosis not present

## 2022-09-11 DIAGNOSIS — D649 Anemia, unspecified: Secondary | ICD-10-CM | POA: Diagnosis not present

## 2022-09-11 DIAGNOSIS — I77810 Thoracic aortic ectasia: Secondary | ICD-10-CM | POA: Diagnosis not present

## 2022-09-11 DIAGNOSIS — I4891 Unspecified atrial fibrillation: Secondary | ICD-10-CM | POA: Diagnosis not present

## 2022-09-11 DIAGNOSIS — I081 Rheumatic disorders of both mitral and tricuspid valves: Secondary | ICD-10-CM | POA: Diagnosis not present

## 2022-09-13 ENCOUNTER — Other Ambulatory Visit: Payer: Self-pay | Admitting: Internal Medicine

## 2022-09-13 ENCOUNTER — Telehealth: Payer: Self-pay | Admitting: Internal Medicine

## 2022-09-13 DIAGNOSIS — I4891 Unspecified atrial fibrillation: Secondary | ICD-10-CM | POA: Diagnosis not present

## 2022-09-13 DIAGNOSIS — D649 Anemia, unspecified: Secondary | ICD-10-CM | POA: Diagnosis not present

## 2022-09-13 DIAGNOSIS — M170 Bilateral primary osteoarthritis of knee: Secondary | ICD-10-CM | POA: Diagnosis not present

## 2022-09-13 DIAGNOSIS — I081 Rheumatic disorders of both mitral and tricuspid valves: Secondary | ICD-10-CM | POA: Diagnosis not present

## 2022-09-13 DIAGNOSIS — I77810 Thoracic aortic ectasia: Secondary | ICD-10-CM | POA: Diagnosis not present

## 2022-09-13 MED ORDER — TEMAZEPAM 15 MG PO CAPS: ORAL_CAPSULE | ORAL | 2 refills | Status: DC

## 2022-09-13 MED ORDER — DICLOFENAC SODIUM 1 % EX GEL: 4.0000 g | Freq: Four times a day (QID) | CUTANEOUS | 11 refills | Status: DC

## 2022-09-13 NOTE — Telephone Encounter (Signed)
Ok for verbal if needed, and will sign forms, thanks

## 2022-09-13 NOTE — Telephone Encounter (Signed)
Stanton Kidney, a nurse, from [facility] called and said they noticed some leg swelling on the patient. She said they would like to provide heart disease education for him. She said she will also ba faxing orders over for the same thing. Best callback is [phone](secure).

## 2022-09-13 NOTE — Telephone Encounter (Signed)
Called and left voicemail giving verbals.

## 2022-09-16 DIAGNOSIS — M170 Bilateral primary osteoarthritis of knee: Secondary | ICD-10-CM | POA: Diagnosis not present

## 2022-09-16 DIAGNOSIS — I4891 Unspecified atrial fibrillation: Secondary | ICD-10-CM | POA: Diagnosis not present

## 2022-09-16 DIAGNOSIS — D649 Anemia, unspecified: Secondary | ICD-10-CM | POA: Diagnosis not present

## 2022-09-16 DIAGNOSIS — I77810 Thoracic aortic ectasia: Secondary | ICD-10-CM | POA: Diagnosis not present

## 2022-09-16 DIAGNOSIS — I081 Rheumatic disorders of both mitral and tricuspid valves: Secondary | ICD-10-CM | POA: Diagnosis not present

## 2022-09-17 DIAGNOSIS — I081 Rheumatic disorders of both mitral and tricuspid valves: Secondary | ICD-10-CM | POA: Diagnosis not present

## 2022-09-17 DIAGNOSIS — M170 Bilateral primary osteoarthritis of knee: Secondary | ICD-10-CM | POA: Diagnosis not present

## 2022-09-17 DIAGNOSIS — I4891 Unspecified atrial fibrillation: Secondary | ICD-10-CM | POA: Diagnosis not present

## 2022-09-17 DIAGNOSIS — I77810 Thoracic aortic ectasia: Secondary | ICD-10-CM | POA: Diagnosis not present

## 2022-09-17 DIAGNOSIS — D649 Anemia, unspecified: Secondary | ICD-10-CM | POA: Diagnosis not present

## 2022-09-20 ENCOUNTER — Ambulatory Visit (INDEPENDENT_AMBULATORY_CARE_PROVIDER_SITE_OTHER): Payer: Medicare Other | Admitting: Internal Medicine

## 2022-09-20 ENCOUNTER — Encounter: Payer: Self-pay | Admitting: Internal Medicine

## 2022-09-20 VITALS — BP 122/64 | HR 90 | Temp 97.9°F | Ht 74.0 in | Wt 183.0 lb

## 2022-09-20 DIAGNOSIS — E559 Vitamin D deficiency, unspecified: Secondary | ICD-10-CM

## 2022-09-20 DIAGNOSIS — R739 Hyperglycemia, unspecified: Secondary | ICD-10-CM | POA: Diagnosis not present

## 2022-09-20 DIAGNOSIS — K4090 Unilateral inguinal hernia, without obstruction or gangrene, not specified as recurrent: Secondary | ICD-10-CM | POA: Diagnosis not present

## 2022-09-20 DIAGNOSIS — E7800 Pure hypercholesterolemia, unspecified: Secondary | ICD-10-CM | POA: Diagnosis not present

## 2022-09-20 DIAGNOSIS — Z23 Encounter for immunization: Secondary | ICD-10-CM

## 2022-09-20 DIAGNOSIS — R609 Edema, unspecified: Secondary | ICD-10-CM

## 2022-09-20 LAB — CBC WITH DIFFERENTIAL/PLATELET
Basophils Absolute: 0 10*3/uL (ref 0.0–0.1)
Basophils Relative: 0.6 % (ref 0.0–3.0)
Eosinophils Absolute: 0.1 10*3/uL (ref 0.0–0.7)
Eosinophils Relative: 1.2 % (ref 0.0–5.0)
HCT: 38.7 % — ABNORMAL LOW (ref 39.0–52.0)
Hemoglobin: 12.6 g/dL — ABNORMAL LOW (ref 13.0–17.0)
Lymphocytes Relative: 24.4 % (ref 12.0–46.0)
Lymphs Abs: 1.4 10*3/uL (ref 0.7–4.0)
MCHC: 32.6 g/dL (ref 30.0–36.0)
MCV: 92.1 fl (ref 78.0–100.0)
Monocytes Absolute: 0.7 10*3/uL (ref 0.1–1.0)
Monocytes Relative: 12.4 % — ABNORMAL HIGH (ref 3.0–12.0)
Neutro Abs: 3.6 10*3/uL (ref 1.4–7.7)
Neutrophils Relative %: 61.4 % (ref 43.0–77.0)
Platelets: 228 10*3/uL (ref 150.0–400.0)
RBC: 4.21 Mil/uL — ABNORMAL LOW (ref 4.22–5.81)
RDW: 14.4 % (ref 11.5–15.5)
WBC: 5.9 10*3/uL (ref 4.0–10.5)

## 2022-09-20 LAB — HEPATIC FUNCTION PANEL
ALT: 8 U/L (ref 0–53)
AST: 13 U/L (ref 0–37)
Albumin: 4.1 g/dL (ref 3.5–5.2)
Alkaline Phosphatase: 70 U/L (ref 39–117)
Bilirubin, Direct: 0.1 mg/dL (ref 0.0–0.3)
Total Bilirubin: 0.7 mg/dL (ref 0.2–1.2)
Total Protein: 6.9 g/dL (ref 6.0–8.3)

## 2022-09-20 LAB — LIPID PANEL
Cholesterol: 162 mg/dL (ref 0–200)
HDL: 57.6 mg/dL (ref >39.00)
LDL Cholesterol: 85 mg/dL (ref 0–99)
NonHDL: 104.16
Total CHOL/HDL Ratio: 3
Triglycerides: 97 mg/dL (ref 0.0–149.0)
VLDL: 19.4 mg/dL (ref 0.0–40.0)

## 2022-09-20 LAB — HEMOGLOBIN A1C: Hgb A1c MFr Bld: 5.6 % (ref 4.6–6.5)

## 2022-09-20 LAB — BASIC METABOLIC PANEL
BUN: 16 mg/dL (ref 6–23)
CO2: 27 mEq/L (ref 19–32)
Calcium: 9.3 mg/dL (ref 8.4–10.5)
Chloride: 104 mEq/L (ref 96–112)
Creatinine, Ser: 0.93 mg/dL (ref 0.40–1.50)
GFR: 74.75 mL/min (ref >60.00)
Glucose, Bld: 95 mg/dL (ref 70–99)
Potassium: 4.2 meq/L (ref 3.5–5.1)
Sodium: 138 meq/L (ref 135–145)

## 2022-09-20 LAB — TSH: TSH: 1.59 u[IU]/mL (ref 0.35–5.50)

## 2022-09-20 MED ORDER — HYDROCHLOROTHIAZIDE 12.5 MG PO CAPS: 12.5000 mg | ORAL_CAPSULE | Freq: Every day | ORAL | 3 refills | Status: DC

## 2022-09-20 NOTE — Progress Notes (Unsigned)
Patient ID: Phillip Shelton, male   DOB: [birth year], 88 y.o.   MRN: [id number]        Chief Complaint: follow up leg swelling, hld, RIH, low vit d       HPI:  Phillip Shelton is a 88 y.o. male here with 1 wk worsening leg swelling, Pt denies chest pain, increased sob or doe, wheezing, orthopnea, PND, palpitations, dizziness or syncope. Has worsening right inguinal pain assoc with swelling that is mild to mod intermittent,  Denies worsening reflux, abd pain, dysphagia, n/v, bowel change or blood.  Denies urinary symptoms such as dysuria, frequency, urgency, flank pain, hematuria or n/v, fever, chills.   Pt denies polydipsia, polyuria, or new focal neuro s/s.   Due for flu shot   Wt Readings from Last 3 Encounters:  09/20/22 183 lb (83 kg)  08/06/22 171 lb 1.6 oz (77.6 kg)  07/21/22 175 lb (79.4 kg)   BP Readings from Last 3 Encounters:  09/20/22 122/64  08/14/22 128/79  07/21/22 124/70         Past Medical History:  Diagnosis Date   Arthritis    Blood in stool 02/12/2010   Qualifier: Diagnosis of  By: Misty Stanley CMA (AAMA), Amanda     BPH (benign prostatic hyperplasia) 01/18/2020   Complication of anesthesia    Diverticulosis    Esophageal stricture    GERD 02/12/2010   Qualifier: Diagnosis of  By: Misty Stanley CMA (AAMA), Marchelle Folks     GERD (gastroesophageal reflux disease)    Hemorrhoids    Hiatal hernia    Idiopathic Charcot's joint of foot 12/27/2012   Mallory - Weiss tear 1981   OAB (overactive bladder) 12/27/2012   Osteoarthritis of right hip 09/19/2014   PONV (postoperative nausea and vomiting)    Pure hypercholesterolemia 12/27/2012   Past Surgical History:  Procedure Laterality Date   CHOLECYSTECTOMY     COLONOSCOPY     GASTROCNEMIUS RECESSION  02/03/2011   Procedure: GASTROCNEMIUS SLIDE;  Surgeon: Sherri Rad, MD;  Location: [facility];  Service: Orthopedics;  Laterality: Right;   HAND SURGERY  jan 2016   rt hand   HEMORRHOID SURGERY  1990   TOTAL HIP  ARTHROPLASTY Right 09/19/2014   Procedure: RIGHT TOTAL HIP ARTHROPLASTY ANTERIOR APPROACH;  Surgeon: Samson Frederic, MD;  Location: [facility];  Service: Orthopedics;  Laterality: Right;   UMBILICAL HERNIA REPAIR     UPPER GASTROINTESTINAL ENDOSCOPY      reports that he quit smoking about 42 years ago. His smoking use included cigarettes. He has never used smokeless tobacco. He reports current alcohol use. He reports that he does not use drugs. family history includes Arthritis in his father; Colon cancer in his maternal uncle; Hypertension in his father. Allergies  Allergen Reactions   Atorvastatin Other (See Comments)    "Made me feel badly and I could not sleep"   Current Outpatient Medications on File Prior to Visit  Medication Sig Dispense Refill   cyanocobalamin (VITAMIN B12) 1000 MCG tablet Take 1,000 mcg by mouth daily.     diclofenac Sodium (VOLTAREN) 1 % GEL Apply 4 g topically 4 (four) times daily. 100 g 11   finasteride (PROSCAR) 5 MG tablet Take 1 tablet (5 mg total) by mouth daily. 90 tablet 3   fish oil-omega-3 fatty acids 1000 MG capsule Take 1 g by mouth at bedtime.      Multiple Vitamins-Minerals (MULTIVITAMIN ADULTS PO) Take 1 tablet by mouth daily with breakfast.  omeprazole (PRILOSEC) 20 MG capsule Take 1 capsule (20 mg total) by mouth daily. (Patient taking differently: Take 20 mg by mouth daily before breakfast.) 90 capsule 2   tamsulosin (FLOMAX) 0.4 MG CAPS capsule Take 1 capsule (0.4 mg total) by mouth daily after supper. 30 capsule 0   temazepam (RESTORIL) 15 MG capsule TAKE 1 CAPSULE BY MOUTH AT BEDTIME AS NEEDED FOR SLEEP. 30 capsule 2   triamcinolone cream (KENALOG) 0.1 % Apply 1 Application topically 2 (two) times daily as needed (for itching).     TYLENOL 8 HOUR ARTHRITIS PAIN 650 MG CR tablet Take 650 mg by mouth See admin instructions. Take 650 mg by mouth in the morning and afternoon     apixaban (ELIQUIS) 5 MG TABS tablet Take 1 tablet (5 mg total) by mouth 2  (two) times daily. 60 tablet 0   metoprolol tartrate (LOPRESSOR) 25 MG tablet Take 0.5 tablets (12.5 mg total) by mouth 2 (two) times daily. 60 tablet 0   mirtazapine (REMERON) 7.5 MG tablet Take 1 tablet (7.5 mg total) by mouth at bedtime. 30 tablet 0   No current facility-administered medications on file prior to visit.        ROS:  All others reviewed and negative.  Objective        PE:  BP 122/64 (BP Location: Right Arm, Patient Position: Sitting, Cuff Size: Normal)   Pulse 90   Temp 97.9 F (36.6 C) (Oral)   Ht 6\' 2"  (1.88 m)   Wt 183 lb (83 kg)   SpO2 97%   BMI 23.50 kg/m                 Constitutional: Pt appears in NAD               HENT: Head: NCAT.                Right Ear: External ear normal.                 Left Ear: External ear normal.                Eyes: . Pupils are equal, round, and reactive to light. Conjunctivae and EOM are normal               Nose: without d/c or deformity               Neck: Neck supple. Gross normal ROM               Cardiovascular: Normal rate and regular rhythm.                 Pulmonary/Chest: Effort normal and breath sounds without rales or wheezing.                Abd:  Soft, NT, ND, + BS, no organomegaly, rih mild tender reducible               Neurological: Pt is alert. At baseline orientation, motor grossly intact               Skin: Skin is warm. No rashes, no other new lesions, LE edema - trace to 1+ bilateral               Psychiatric: Pt behavior is normal without agitation   Micro: none  Cardiac tracings I have personally interpreted today:  none  Pertinent Radiological findings (summarize): none   Lab Results  Component Value Date  WBC 5.9 09/20/2022   HGB 12.6 (L) 09/20/2022   HCT 38.7 (L) 09/20/2022   PLT 228.0 09/20/2022   GLUCOSE 95 09/20/2022   CHOL 162 09/20/2022   TRIG 97.0 09/20/2022   HDL 57.60 09/20/2022   LDLDIRECT 133.0 12/22/2012   LDLCALC 85 09/20/2022   ALT 8 09/20/2022   AST 13 09/20/2022    NA 138 09/20/2022   K 4.2 09/20/2022   CL 104 09/20/2022   CREATININE 0.93 09/20/2022   BUN 16 09/20/2022   CO2 27 09/20/2022   TSH 1.59 09/20/2022   INR 1.1 08/06/2022   HGBA1C 5.6 09/20/2022   MICROALBUR 3.3 (H) 12/26/2013   Assessment/Plan:  Phillip Shelton is a 88 y.o. White or Caucasian [1] male with  has a past medical history of Arthritis, Blood in stool (02/12/2010), BPH (benign prostatic hyperplasia) (01/18/2020), Complication of anesthesia, Diverticulosis, Esophageal stricture, GERD (02/12/2010), GERD (gastroesophageal reflux disease), Hemorrhoids, Hiatal hernia, Idiopathic Charcot's joint of foot (12/27/2012), Mallory - Weiss tear (1981), OAB (overactive bladder) (12/27/2012), Osteoarthritis of right hip (09/19/2014), PONV (postoperative nausea and vomiting), and Pure hypercholesterolemia (12/27/2012).  Edema Mild, for hct 12.5 every day, to f/u any worsening symptoms or concerns, for repeat bmp in 2 wks   Pure hypercholesterolemia Lab Results  Component Value Date   LDLCALC 85 09/20/2022   Unocntrolled, pt to continue low chol diet, declines statin for now   Right inguinal hernia With worsening mild to mod intermittent pain ,for general surgury referral  Vitamin D deficiency Last vitamin D Lab Results  Component Value Date   VD25OH 37.50 01/20/2022   Low, to start oral replacement  Followup: Return in about 4 months (around 01/21/2023).  Oliver Barre, MD 09/21/2022 9:08 PM [facility]

## 2022-09-20 NOTE — Patient Instructions (Addendum)
You had the flu shot today  Please take all new medication as prescribed - the mild fluid pill hct 12.5 mg per day  Please continue all other medications as before, and refills have been done if requested.  Please have the pharmacy call with any other refills you may need.  Please continue your efforts at being more active, low cholesterol diet, and weight control.  Please keep your appointments with your specialists as you may have planned  You will be contacted regarding the referral for: General surgury  Please go to the LAB at the blood drawing area for the tests to be done  You will be contacted by phone if any changes need to be made immediately.  Otherwise, you will receive a letter about your results with an explanation, but please check with MyChart first.  Please also return in 2 weeks tot he LAB for repeat kidney testing due to the new fluid pill  Please make an Appointment to return in jan 17, or sooner if needed

## 2022-09-21 ENCOUNTER — Encounter: Payer: Self-pay | Admitting: Internal Medicine

## 2022-09-21 DIAGNOSIS — K4090 Unilateral inguinal hernia, without obstruction or gangrene, not specified as recurrent: Secondary | ICD-10-CM | POA: Insufficient documentation

## 2022-09-21 DIAGNOSIS — I4891 Unspecified atrial fibrillation: Secondary | ICD-10-CM | POA: Diagnosis not present

## 2022-09-21 DIAGNOSIS — M170 Bilateral primary osteoarthritis of knee: Secondary | ICD-10-CM | POA: Diagnosis not present

## 2022-09-21 DIAGNOSIS — R609 Edema, unspecified: Secondary | ICD-10-CM | POA: Insufficient documentation

## 2022-09-21 DIAGNOSIS — D649 Anemia, unspecified: Secondary | ICD-10-CM | POA: Diagnosis not present

## 2022-09-21 DIAGNOSIS — I081 Rheumatic disorders of both mitral and tricuspid valves: Secondary | ICD-10-CM | POA: Diagnosis not present

## 2022-09-21 DIAGNOSIS — I77810 Thoracic aortic ectasia: Secondary | ICD-10-CM | POA: Diagnosis not present

## 2022-09-21 NOTE — Assessment & Plan Note (Signed)
Last vitamin D Lab Results  Component Value Date   VD25OH 37.50 01/20/2022   Low, to start oral replacement

## 2022-09-21 NOTE — Assessment & Plan Note (Addendum)
Mild, for hct 12.5 every day, to f/u any worsening symptoms or concerns, for repeat bmp in 2 wks

## 2022-09-21 NOTE — Assessment & Plan Note (Signed)
Lab Results  Component Value Date   LDLCALC 85 09/20/2022   Unocntrolled, pt to continue low chol diet, declines statin for now

## 2022-09-21 NOTE — Assessment & Plan Note (Signed)
With worsening mild to mod intermittent pain ,for general surgury referral

## 2022-09-22 DIAGNOSIS — I081 Rheumatic disorders of both mitral and tricuspid valves: Secondary | ICD-10-CM | POA: Diagnosis not present

## 2022-09-22 DIAGNOSIS — I77810 Thoracic aortic ectasia: Secondary | ICD-10-CM | POA: Diagnosis not present

## 2022-09-22 DIAGNOSIS — D649 Anemia, unspecified: Secondary | ICD-10-CM | POA: Diagnosis not present

## 2022-09-22 DIAGNOSIS — M170 Bilateral primary osteoarthritis of knee: Secondary | ICD-10-CM | POA: Diagnosis not present

## 2022-09-22 DIAGNOSIS — I4891 Unspecified atrial fibrillation: Secondary | ICD-10-CM | POA: Diagnosis not present

## 2022-09-22 NOTE — Telephone Encounter (Signed)
[facility] called asking if we could refax the signed orders to them. Best fax number is [phone].

## 2022-09-24 NOTE — Telephone Encounter (Signed)
Tried to fax again twice to the fax number provided and no answer.

## 2022-09-28 DIAGNOSIS — D649 Anemia, unspecified: Secondary | ICD-10-CM | POA: Diagnosis not present

## 2022-09-28 DIAGNOSIS — I081 Rheumatic disorders of both mitral and tricuspid valves: Secondary | ICD-10-CM | POA: Diagnosis not present

## 2022-09-28 DIAGNOSIS — I77810 Thoracic aortic ectasia: Secondary | ICD-10-CM | POA: Diagnosis not present

## 2022-09-28 DIAGNOSIS — M170 Bilateral primary osteoarthritis of knee: Secondary | ICD-10-CM | POA: Diagnosis not present

## 2022-09-28 DIAGNOSIS — I4891 Unspecified atrial fibrillation: Secondary | ICD-10-CM | POA: Diagnosis not present

## 2022-10-04 ENCOUNTER — Other Ambulatory Visit: Payer: Self-pay | Admitting: Internal Medicine

## 2022-10-04 ENCOUNTER — Other Ambulatory Visit: Payer: Self-pay

## 2022-10-04 NOTE — Progress Notes (Signed)
This encounter was created in error - please disregard. Called patient and the phone just kept ringing.  I called again and the VM came on, I left a detailed message for patient to call office to reschedule visit.

## 2022-10-06 ENCOUNTER — Encounter: Payer: Self-pay | Admitting: Internal Medicine

## 2022-10-06 ENCOUNTER — Other Ambulatory Visit (INDEPENDENT_AMBULATORY_CARE_PROVIDER_SITE_OTHER): Payer: Medicare Other

## 2022-10-06 DIAGNOSIS — E7800 Pure hypercholesterolemia, unspecified: Secondary | ICD-10-CM | POA: Diagnosis not present

## 2022-10-06 LAB — BASIC METABOLIC PANEL
BUN: 18 mg/dL (ref 6–23)
CO2: 28 meq/L (ref 19–32)
Calcium: 9.6 mg/dL (ref 8.4–10.5)
Chloride: 100 meq/L (ref 96–112)
Creatinine, Ser: 0.98 mg/dL (ref 0.40–1.50)
GFR: 70.18 mL/min (ref >60.00)
Glucose, Bld: 109 mg/dL — ABNORMAL HIGH (ref 70–99)
Potassium: 4.5 meq/L (ref 3.5–5.1)
Sodium: 135 meq/L (ref 135–145)

## 2022-10-07 ENCOUNTER — Telehealth: Payer: Self-pay | Admitting: Internal Medicine

## 2022-10-07 NOTE — Telephone Encounter (Signed)
Called and gave verbals.

## 2022-10-07 NOTE — Telephone Encounter (Signed)
Caller & What Company:  Doug with [facility]   Phone Number:  [phone]   Needs Verbal orders for what service & frequency:  Home Health PT 1 time a week for 3 weeks starting next week.

## 2022-10-07 NOTE — Telephone Encounter (Signed)
Ok verbals 

## 2022-10-12 ENCOUNTER — Ambulatory Visit: Payer: Medicare Other | Admitting: Gastroenterology

## 2022-10-12 DIAGNOSIS — I4891 Unspecified atrial fibrillation: Secondary | ICD-10-CM | POA: Diagnosis not present

## 2022-10-12 DIAGNOSIS — I77810 Thoracic aortic ectasia: Secondary | ICD-10-CM | POA: Diagnosis not present

## 2022-10-12 DIAGNOSIS — I081 Rheumatic disorders of both mitral and tricuspid valves: Secondary | ICD-10-CM | POA: Diagnosis not present

## 2022-10-12 DIAGNOSIS — D649 Anemia, unspecified: Secondary | ICD-10-CM | POA: Diagnosis not present

## 2022-10-12 DIAGNOSIS — M170 Bilateral primary osteoarthritis of knee: Secondary | ICD-10-CM | POA: Diagnosis not present

## 2022-10-20 ENCOUNTER — Ambulatory Visit: Payer: Self-pay | Admitting: Surgery

## 2022-10-20 DIAGNOSIS — I081 Rheumatic disorders of both mitral and tricuspid valves: Secondary | ICD-10-CM | POA: Diagnosis not present

## 2022-10-20 DIAGNOSIS — K4090 Unilateral inguinal hernia, without obstruction or gangrene, not specified as recurrent: Secondary | ICD-10-CM | POA: Diagnosis not present

## 2022-10-20 DIAGNOSIS — I77810 Thoracic aortic ectasia: Secondary | ICD-10-CM | POA: Diagnosis not present

## 2022-10-20 DIAGNOSIS — M170 Bilateral primary osteoarthritis of knee: Secondary | ICD-10-CM | POA: Diagnosis not present

## 2022-10-20 DIAGNOSIS — D649 Anemia, unspecified: Secondary | ICD-10-CM | POA: Diagnosis not present

## 2022-10-20 DIAGNOSIS — I4891 Unspecified atrial fibrillation: Secondary | ICD-10-CM | POA: Diagnosis not present

## 2022-10-20 NOTE — H&P (View-Only) (Signed)
Subjective   Chief Complaint: New Consultation (Right inguinal hernia)       History of Present Illness: Phillip Shelton is a 88 y.o. male who is seen today as an office consultation at the request of Dr. Jonny Ruiz for evaluation of New Consultation (Right inguinal hernia) .   This is an 88 year old male with multiple medical issues including recent diagnosis of atrial fibrillation now on Eliquis who presents with several months of an enlarging uncomfortable bulge in his right groin.  He was found to have a reducible right inguinal hernia, so he was referred to [place] for surgical repair.  He has not yet seen cardiology after his recent hospital admission.  The patient ambulates with a walker due to weakness and unsteadiness.  He has chronic lower extremity edema.  He currently resides at a SNF.   He reports that he can reduce the hernia and denies any obstructive symptoms.     Review of Systems: A complete review of systems was obtained from the patient.  I have reviewed this information and discussed as appropriate with the patient.  See HPI as well for other ROS.   Review of Systems  Constitutional: Negative.   HENT: Negative.    Eyes: Negative.   Respiratory: Negative.    Cardiovascular:  Positive for leg swelling.  Gastrointestinal: Negative.   Genitourinary: Negative.   Musculoskeletal:  Positive for falls.  Skin: Negative.   Neurological:  Positive for weakness.  Endo/Heme/Allergies: Negative.   Psychiatric/Behavioral: Negative.          Medical History: Past Medical History Past Medical History: Diagnosis Date  Arthritis    GERD (gastroesophageal reflux disease)        Problem List Patient Active Problem List Diagnosis  Esophageal reflux  Gait disorder  History of total hip arthroplasty  New onset atrial fibrillation (CMS/HHS-HCC)  Right inguinal hernia  Sensorineural hearing loss (SNHL), bilateral  Vitamin D deficiency  Weakness  Synovial cyst of left popliteal  space  Statin myopathy  Pure hypercholesterolemia  Osteoarthritis of right hip  Osteoarthritis of right glenohumeral joint  Osteoarthritis of left knee  Normocytic anemia, unspecified  Insomnia  Impacted cerumen of right ear  Idiopathic Charcot's joint of foot  Hyponatremia  Hemorrhoids  Generalized osteoarthritis  Edema  Depression, major, single episode, moderate (CMS/HHS-HCC)  BPH (benign prostatic hyperplasia)  Ambulatory dysfunction      Past Surgical History Past Surgical History: Procedure Laterality Date  GASTROCNEMIUS RECESSION   2013  REVISION TOTAL HIP ARTHROPLASTY Right 2016  hand surgery   2016  CHOLECYSTECTOMY      JOINT REPLACEMENT      UMBILICAL HERNIA REPAIR          Allergies Allergies Allergen Reactions  Atorvastatin Other (See Comments)     Just feels bad and cant sleep   "Made me feel badly and I could not sleep"      Medications Ordered Prior to Encounter Current Outpatient Medications on File Prior to Visit Medication Sig Dispense Refill  acetaminophen (TYLENOL ARTHRITIS PAIN) 650 MG ER tablet Take by mouth      apixaban (ELIQUIS) 5 mg tablet Take 5 mg by mouth 2 (two) times daily      cyanocobalamin (VITAMIN B12) 1000 MCG tablet Take 1,000 mcg by mouth once daily      diclofenac (VOLTAREN) 1 % topical gel Apply 4 g topically 4 (four) times daily      finasteride (PROSCAR) 5 mg tablet Take 5 mg by mouth  once daily      hydroCHLOROthiazide (MICROZIDE) 12.5 mg capsule TAKE ONE CAPSULE BY MOUTH EVERY DAY FOR FLUID      metoprolol tartrate (LOPRESSOR) 25 MG tablet Take 12.5 mg by mouth 3 (three) times daily as needed      mirtazapine (REMERON) 7.5 MG tablet Take 7.5 mg by mouth at bedtime      omega-3-dha-epa-fish oil 300-1,000 mg capsule Take by mouth      omeprazole (PRILOSEC) 20 MG DR capsule Take 1 capsule by mouth once daily      tamsulosin (FLOMAX) 0.4 mg capsule Take 0.4 mg by mouth daily after dinner      temazepam (RESTORIL) 15 mg  capsule Take 1 capsule by mouth at bedtime as needed      triamcinolone 0.1 % cream Apply topically        No current facility-administered medications on file prior to visit.      Family History Family History Problem Relation Age of Onset  High blood pressure (Hypertension) Father        Tobacco Use History Social History    Tobacco Use Smoking Status Former  Types: Cigarettes Smokeless Tobacco Never      Social History Social History    Socioeconomic History  Marital status: Married Tobacco Use  Smoking status: Former     Types: Cigarettes  Smokeless tobacco: Never Vaping Use  Vaping status: Never Used Substance and Sexual Activity  Alcohol use: Not Currently  Drug use: Never    Social Drivers of Manufacturing engineer Strain: Low Risk  (10/02/2021)   Received [facility]   Overall Financial Resource Strain (CARDIA)    Difficulty of Paying Living Expenses: Not hard at all Food Insecurity: No Food Insecurity (08/08/2022)   Received from [facility]   Hunger Vital Sign    Worried About Running Out of Food in the Last Year: Never true    Ran Out of Food in the Last Year: Never true Transportation Needs: No Transportation Needs (08/08/2022)   Received from [facility] - Transportation    Lack of Transportation (Medical): No    Lack of Transportation (Non-Medical): No Physical Activity: Inactive (10/02/2021)   Received from [facility]   Exercise Vital Sign    Days of Exercise per Week: 0 days    Minutes of Exercise per Session: 0 min Stress: No Stress Concern Present (10/02/2021)   Received from [facility] - Occupational Stress Questionnaire    Feeling of Stress : Not at all Social Connections: Moderately Isolated (10/02/2021)   Received from [facility]   Social Connection and Isolation Panel [NHANES]    Frequency of Communication with Friends and Family: More than three times a week     Frequency of Social Gatherings with Friends and Family: Once a week    Attends Religious Services: Never    Database administrator or Organizations: No    Attends Banker Meetings: Never    Marital Status: Married Housing Stability: Low Risk  (04/13/2022)   Received from [employer] Stability Vital Sign    What is your living situation today?: I have a steady place to live    Think about the place you live. Do you have problems with any of the following? Choose all that apply:: None/None on this list      Objective:     Vitals:   10/20/22  1352 BP: 124/63 Temp: 36.4 C (97.5 F) SpO2: 96% Weight: 83.9 kg (185 lb) Height: 185.4 cm (6\' 1" ) PainSc: 0-No pain   Body mass index is 24.41 kg/m.   Physical Exam    Constitutional:  WDWN in NAD, conversant, no obvious deformities; lying in bed comfortably Eyes:  Pupils equal, round; sclera anicteric; moist conjunctiva; no lid lag HENT:  Oral mucosa moist; good dentition  Neck:  No masses palpated, trachea midline; no thyromegaly Lungs:  CTA bilaterally; normal respiratory effort CV:  Regular rate and rhythm; no murmurs; extremities well-perfused with no edema Abd:  +bowel sounds, soft, non-tender, no palpable organomegaly; no palpable hernias GU:  bilateral descended testes; no testicular masses; small reducible right inguinal hernia. No left inguinal hernia Musc:  Unsteady gait with walker. 2+ peripheral edema Lymphatic:  No palpable cervical or axillary lymphadenopathy Skin:  Warm, dry; no sign of jaundice Psychiatric - alert and oriented x 4; calm mood and affect     Assessment and Plan: Diagnoses and all orders for this visit:   Right inguinal hernia       Due to the increasing size and discomfort, right inguinal hernia repair with mesh can be considered.  However, prior to scheduling surgery, the patient needs to be evaluated and cleared by Cardiology.  He will need to stop his Eliquis for a few  days prior to surgery.     I will reach out to his PCP to discuss referral to Cardiology.  Once we have received clearance, we will proceed with scheduling right inguinal hernia repair with mesh.  The surgical procedure has been discussed with the patient.  Potential risks, benefits, alternative treatments, and expected outcomes have been explained.  All of the patient's questions at this time have been answered.  The likelihood of reaching the patient's treatment goal is good.  The patient understands the proposed surgical procedure and wishes to proceed.   We will schedule his surgery at [facility] and will likely keep him overnight for observation.   Lissa Morales, MD  10/20/2022 8:48 PM

## 2022-10-20 NOTE — H&P (Signed)
Subjective   Chief Complaint: New Consultation (Right inguinal hernia)       History of Present Illness: Phillip Shelton is a 88 y.o. male who is seen today as an office consultation at the request of Dr. Jonny Ruiz for evaluation of New Consultation (Right inguinal hernia) .   This is an 88 year old male with multiple medical issues including recent diagnosis of atrial fibrillation now on Eliquis who presents with several months of an enlarging uncomfortable bulge in his right groin.  He was found to have a reducible right inguinal hernia, so he was referred to [place] for surgical repair.  He has not yet seen cardiology after his recent hospital admission.  The patient ambulates with a walker due to weakness and unsteadiness.  He has chronic lower extremity edema.  He currently resides at a SNF.   He reports that he can reduce the hernia and denies any obstructive symptoms.     Review of Systems: A complete review of systems was obtained from the patient.  I have reviewed this information and discussed as appropriate with the patient.  See HPI as well for other ROS.   Review of Systems  Constitutional: Negative.   HENT: Negative.    Eyes: Negative.   Respiratory: Negative.    Cardiovascular:  Positive for leg swelling.  Gastrointestinal: Negative.   Genitourinary: Negative.   Musculoskeletal:  Positive for falls.  Skin: Negative.   Neurological:  Positive for weakness.  Endo/Heme/Allergies: Negative.   Psychiatric/Behavioral: Negative.          Medical History: Past Medical History Past Medical History: Diagnosis Date  Arthritis    GERD (gastroesophageal reflux disease)        Problem List Patient Active Problem List Diagnosis  Esophageal reflux  Gait disorder  History of total hip arthroplasty  New onset atrial fibrillation (CMS/HHS-HCC)  Right inguinal hernia  Sensorineural hearing loss (SNHL), bilateral  Vitamin D deficiency  Weakness  Synovial cyst of left popliteal  space  Statin myopathy  Pure hypercholesterolemia  Osteoarthritis of right hip  Osteoarthritis of right glenohumeral joint  Osteoarthritis of left knee  Normocytic anemia, unspecified  Insomnia  Impacted cerumen of right ear  Idiopathic Charcot's joint of foot  Hyponatremia  Hemorrhoids  Generalized osteoarthritis  Edema  Depression, major, single episode, moderate (CMS/HHS-HCC)  BPH (benign prostatic hyperplasia)  Ambulatory dysfunction      Past Surgical History Past Surgical History: Procedure Laterality Date  GASTROCNEMIUS RECESSION   2013  REVISION TOTAL HIP ARTHROPLASTY Right 2016  hand surgery   2016  CHOLECYSTECTOMY      JOINT REPLACEMENT      UMBILICAL HERNIA REPAIR          Allergies Allergies Allergen Reactions  Atorvastatin Other (See Comments)     Just feels bad and cant sleep   "Made me feel badly and I could not sleep"      Medications Ordered Prior to Encounter Current Outpatient Medications on File Prior to Visit Medication Sig Dispense Refill  acetaminophen (TYLENOL ARTHRITIS PAIN) 650 MG ER tablet Take by mouth      apixaban (ELIQUIS) 5 mg tablet Take 5 mg by mouth 2 (two) times daily      cyanocobalamin (VITAMIN B12) 1000 MCG tablet Take 1,000 mcg by mouth once daily      diclofenac (VOLTAREN) 1 % topical gel Apply 4 g topically 4 (four) times daily      finasteride (PROSCAR) 5 mg tablet Take 5 mg by mouth  once daily      hydroCHLOROthiazide (MICROZIDE) 12.5 mg capsule TAKE ONE CAPSULE BY MOUTH EVERY DAY FOR FLUID      metoprolol tartrate (LOPRESSOR) 25 MG tablet Take 12.5 mg by mouth 3 (three) times daily as needed      mirtazapine (REMERON) 7.5 MG tablet Take 7.5 mg by mouth at bedtime      omega-3-dha-epa-fish oil 300-1,000 mg capsule Take by mouth      omeprazole (PRILOSEC) 20 MG DR capsule Take 1 capsule by mouth once daily      tamsulosin (FLOMAX) 0.4 mg capsule Take 0.4 mg by mouth daily after dinner      temazepam (RESTORIL) 15 mg  capsule Take 1 capsule by mouth at bedtime as needed      triamcinolone 0.1 % cream Apply topically        No current facility-administered medications on file prior to visit.      Family History Family History Problem Relation Age of Onset  High blood pressure (Hypertension) Father        Tobacco Use History Social History    Tobacco Use Smoking Status Former  Types: Cigarettes Smokeless Tobacco Never      Social History Social History    Socioeconomic History  Marital status: Married Tobacco Use  Smoking status: Former     Types: Cigarettes  Smokeless tobacco: Never Vaping Use  Vaping status: Never Used Substance and Sexual Activity  Alcohol use: Not Currently  Drug use: Never    Social Drivers of Manufacturing engineer Strain: Low Risk  (10/02/2021)   Received [facility]   Overall Financial Resource Strain (CARDIA)    Difficulty of Paying Living Expenses: Not hard at all Food Insecurity: No Food Insecurity (08/08/2022)   Received from [facility]   Hunger Vital Sign    Worried About Running Out of Food in the Last Year: Never true    Ran Out of Food in the Last Year: Never true Transportation Needs: No Transportation Needs (08/08/2022)   Received from [facility] - Transportation    Lack of Transportation (Medical): No    Lack of Transportation (Non-Medical): No Physical Activity: Inactive (10/02/2021)   Received from [facility]   Exercise Vital Sign    Days of Exercise per Week: 0 days    Minutes of Exercise per Session: 0 min Stress: No Stress Concern Present (10/02/2021)   Received from [facility] - Occupational Stress Questionnaire    Feeling of Stress : Not at all Social Connections: Moderately Isolated (10/02/2021)   Received from [facility]   Social Connection and Isolation Panel [NHANES]    Frequency of Communication with Friends and Family: More than three times a week     Frequency of Social Gatherings with Friends and Family: Once a week    Attends Religious Services: Never    Database administrator or Organizations: No    Attends Banker Meetings: Never    Marital Status: Married Housing Stability: Low Risk  (04/13/2022)   Received from [employer] Stability Vital Sign    What is your living situation today?: I have a steady place to live    Think about the place you live. Do you have problems with any of the following? Choose all that apply:: None/None on this list      Objective:     Vitals:   10/20/22  1352 BP: 124/63 Temp: 36.4 C (97.5 F) SpO2: 96% Weight: 83.9 kg (185 lb) Height: 185.4 cm (6\' 1" ) PainSc: 0-No pain   Body mass index is 24.41 kg/m.   Physical Exam    Constitutional:  WDWN in NAD, conversant, no obvious deformities; lying in bed comfortably Eyes:  Pupils equal, round; sclera anicteric; moist conjunctiva; no lid lag HENT:  Oral mucosa moist; good dentition  Neck:  No masses palpated, trachea midline; no thyromegaly Lungs:  CTA bilaterally; normal respiratory effort CV:  Regular rate and rhythm; no murmurs; extremities well-perfused with no edema Abd:  +bowel sounds, soft, non-tender, no palpable organomegaly; no palpable hernias GU:  bilateral descended testes; no testicular masses; small reducible right inguinal hernia. No left inguinal hernia Musc:  Unsteady gait with walker. 2+ peripheral edema Lymphatic:  No palpable cervical or axillary lymphadenopathy Skin:  Warm, dry; no sign of jaundice Psychiatric - alert and oriented x 4; calm mood and affect     Assessment and Plan: Diagnoses and all orders for this visit:   Right inguinal hernia       Due to the increasing size and discomfort, right inguinal hernia repair with mesh can be considered.  However, prior to scheduling surgery, the patient needs to be evaluated and cleared by Cardiology.  He will need to stop his Eliquis for a few  days prior to surgery.     I will reach out to his PCP to discuss referral to Cardiology.  Once we have received clearance, we will proceed with scheduling right inguinal hernia repair with mesh.  The surgical procedure has been discussed with the patient.  Potential risks, benefits, alternative treatments, and expected outcomes have been explained.  All of the patient's questions at this time have been answered.  The likelihood of reaching the patient's treatment goal is good.  The patient understands the proposed surgical procedure and wishes to proceed.   We will schedule his surgery at [facility] and will likely keep him overnight for observation.   Lissa Morales, MD  10/20/2022 8:48 PM

## 2022-10-22 ENCOUNTER — Telehealth: Payer: Self-pay | Admitting: *Deleted

## 2022-10-22 NOTE — Telephone Encounter (Signed)
Name: Phillip Shelton  DOB: 1934-12-31  MRN: [id number]  Primary Cardiologist: None  Chart reviewed as part of pre-operative protocol coverage. Because of [name] past medical history and time since last visit, he will require a follow-up in-office visit in order to better assess preoperative cardiovascular risk.  Pre-op covering staff: - Please schedule appointment and call patient to inform them. If patient already had an upcoming appointment within acceptable timeframe, please add "pre-op clearance" to the appointment notes so provider is aware. - Please contact requesting surgeon's office via preferred method (i.e, phone, fax) to inform them of need for appointment prior to surgery.  No medications indicated as needing held.   Sharlene Dory, PA-C  10/22/2022, 9:52 AM

## 2022-10-22 NOTE — Telephone Encounter (Signed)
Pre-operative Risk Assessment    Patient Name: Phillip Shelton  DOB: 09/08/1934 MRN: [id number]    PT WILL NEED A NEW PT APPOINTMENT, REFERRAL HAS BEEN RECEIVED.  Request for Surgical Clearance    Procedure:   RIGHT INGUINAL HERNIA REPAIR  Date of Surgery:  Clearance TBD                                 Surgeon:  Manus Rudd, MD Surgeon's Group or Practice Name:  [facility] Phone number:  [phone] Fax number:  [phone]   Type of Clearance Requested:   - Medical    Type of Anesthesia:  General    Additional requests/questions:    Wilhemina Cash   10/22/2022, 9:50 AM

## 2022-10-27 DIAGNOSIS — M170 Bilateral primary osteoarthritis of knee: Secondary | ICD-10-CM | POA: Diagnosis not present

## 2022-10-27 DIAGNOSIS — I4891 Unspecified atrial fibrillation: Secondary | ICD-10-CM | POA: Diagnosis not present

## 2022-10-27 DIAGNOSIS — D649 Anemia, unspecified: Secondary | ICD-10-CM | POA: Diagnosis not present

## 2022-10-27 DIAGNOSIS — I77810 Thoracic aortic ectasia: Secondary | ICD-10-CM | POA: Diagnosis not present

## 2022-10-27 DIAGNOSIS — I081 Rheumatic disorders of both mitral and tricuspid valves: Secondary | ICD-10-CM | POA: Diagnosis not present

## 2022-10-28 NOTE — Progress Notes (Signed)
Chief Complaint  Patient presents with   New Patient (Initial Visit)    Atrial fibrillation   History of Present Illness: 88 yo male with history of BPH, GERD, hiatal hernia, hyperlipidemia and PVCs who is here today as a new patient to re-establish care. He was seen in 2020 by Dr. Eldridge Dace for evaluation of PVCs. He was found to have atrial fibrillation. He has been started on Eliquis.  Echo August 2024 with LVEF=50-55%. No valve disease. Mild dilation of ascending aorta at 4.2 cm. He tells me today that he feels well overall. He has no chest pain, dyspnea or awareness of palpitations. No LE edema. Upcoming hernia surgery.   Primary Care Physician: Corwin Levins, MD   Past Medical History:  Diagnosis Date   Arthritis    Atrial fibrillation (HCC)    Blood in stool 02/12/2010   Qualifier: Diagnosis of  By: Misty Stanley CMA (AAMA), Amanda     BPH (benign prostatic hyperplasia) 01/18/2020   Complication of anesthesia    Diverticulosis    Esophageal stricture    GERD 02/12/2010   Qualifier: Diagnosis of  By: Misty Stanley CMA (AAMA), Marchelle Folks     GERD (gastroesophageal reflux disease)    Hemorrhoids    Hiatal hernia    Idiopathic Charcot's joint of foot 12/27/2012   Mallory - Weiss tear 1981   OAB (overactive bladder) 12/27/2012   Osteoarthritis of right hip 09/19/2014   PONV (postoperative nausea and vomiting)    Pure hypercholesterolemia 12/27/2012    Past Surgical History:  Procedure Laterality Date   CHOLECYSTECTOMY     COLONOSCOPY     GASTROCNEMIUS RECESSION  02/03/2011   Procedure: GASTROCNEMIUS SLIDE;  Surgeon: Sherri Rad, MD;  Location: [facility];  Service: Orthopedics;  Laterality: Right;   HAND SURGERY  jan 2016   rt hand   HEMORRHOID SURGERY  1990   TOTAL HIP ARTHROPLASTY Right 09/19/2014   Procedure: RIGHT TOTAL HIP ARTHROPLASTY ANTERIOR APPROACH;  Surgeon: Samson Frederic, MD;  Location: [facility];  Service: Orthopedics;  Laterality: Right;   UMBILICAL  HERNIA REPAIR     UPPER GASTROINTESTINAL ENDOSCOPY      Current Outpatient Medications  Medication Sig Dispense Refill   apixaban (ELIQUIS) 5 MG TABS tablet Take 1 tablet (5 mg total) by mouth 2 (two) times daily. 60 tablet 0   cyanocobalamin (VITAMIN B12) 1000 MCG tablet Take 1,000 mcg by mouth daily.     diclofenac Sodium (VOLTAREN) 1 % GEL Apply 4 g topically 4 (four) times daily. 100 g 11   finasteride (PROSCAR) 5 MG tablet Take 1 tablet (5 mg total) by mouth daily. 90 tablet 3   fish oil-omega-3 fatty acids 1000 MG capsule Take 1 g by mouth at bedtime.      hydrochlorothiazide (MICROZIDE) 12.5 MG capsule TAKE ONE CAPSULE BY MOUTH EVERY DAY FOR FLUID 30 capsule 0   Multiple Vitamins-Minerals (MULTIVITAMIN ADULTS PO) Take 1 tablet by mouth daily with breakfast.     omeprazole (PRILOSEC) 20 MG capsule Take 1 capsule (20 mg total) by mouth daily. (Patient taking differently: Take 20 mg by mouth daily before breakfast.) 90 capsule 2   tamsulosin (FLOMAX) 0.4 MG CAPS capsule Take 1 capsule (0.4 mg total) by mouth daily after supper. 30 capsule 0   temazepam (RESTORIL) 15 MG capsule TAKE 1 CAPSULE BY MOUTH AT BEDTIME AS NEEDED FOR SLEEP. 30 capsule 2   triamcinolone cream (KENALOG) 0.1 % Apply 1 Application topically 2 (two) times  daily as needed (for itching).     TYLENOL 8 HOUR ARTHRITIS PAIN 650 MG CR tablet Take 650 mg by mouth See admin instructions. Take 650 mg by mouth in the morning and afternoon     metoprolol tartrate (LOPRESSOR) 25 MG tablet Take 0.5 tablets (12.5 mg total) by mouth 2 (two) times daily. 60 tablet 0   mirtazapine (REMERON) 7.5 MG tablet Take 1 tablet (7.5 mg total) by mouth at bedtime. 30 tablet 0   No current facility-administered medications for this visit.    Allergies  Allergen Reactions   Atorvastatin Other (See Comments)    "Made me feel badly and I could not sleep"    Social History   Socioeconomic History   Marital status: Married    Spouse name: Not  on file   Number of children: 0   Years of education: Not on file   Highest education level: Not on file  Occupational History   Occupation: Retired-worked for [employer]  Tobacco Use   Smoking status: Former    Current packs/day: 0.00    Types: Cigarettes    Quit date: 01/29/1980    Years since quitting: 42.7   Smokeless tobacco: Never  Vaping Use   Vaping status: Never Used  Substance and Sexual Activity   Alcohol use: Not Currently   Drug use: No   Sexual activity: Not on file  Other Topics Concern   Not on file  Social History Narrative   Not on file   Social Determinants of Health   Financial Resource Strain: Low Risk  (10/02/2021)   Overall Financial Resource Strain (CARDIA)    Difficulty of Paying Living Expenses: Not hard at all  Food Insecurity: No Food Insecurity (08/08/2022)   Hunger Vital Sign    Worried About Running Out of Food in the Last Year: Never true    Ran Out of Food in the Last Year: Never true  Transportation Needs: No Transportation Needs (08/08/2022)   PRAPARE - Administrator, Civil Service (Medical): No    Lack of Transportation (Non-Medical): No  Physical Activity: Inactive (10/02/2021)   Exercise Vital Sign    Days of Exercise per Week: 0 days    Minutes of Exercise per Session: 0 min  Stress: No Stress Concern Present (10/02/2021)   [employer] of Occupational Health - Occupational Stress Questionnaire    Feeling of Stress : Not at all  Social Connections: Moderately Isolated (10/02/2021)   Social Connection and Isolation Panel [NHANES]    Frequency of Communication with Friends and Family: More than three times a week    Frequency of Social Gatherings with Friends and Family: Once a week    Attends Religious Services: Never    Database administrator or Organizations: No    Attends Banker Meetings: Never    Marital Status: Married  Catering manager Violence: Not At Risk (08/08/2022)   Humiliation, Afraid, Rape, and  Kick questionnaire    Fear of Current or Ex-Partner: No    Emotionally Abused: No    Physically Abused: No    Sexually Abused: No    Family History  Problem Relation Age of Onset   Arthritis Father    Hypertension Father    Colon cancer Maternal Uncle    Diabetes Neg Hx    Heart disease Neg Hx    Stomach cancer Neg Hx    Rectal cancer Neg Hx     Review of Systems:  As stated  in the HPI and otherwise negative.   BP 112/62   Pulse 76   Ht 6\' 2"  (1.88 m)   Wt 85.2 kg   SpO2 95%   BMI 24.11 kg/m   Physical Examination: General: Well developed, well nourished, NAD  HEENT: OP clear, mucus membranes moist  SKIN: warm, dry. No rashes. Neuro: No focal deficits  Musculoskeletal: Muscle strength 5/5 all ext  Psychiatric: Mood and affect normal  Neck: No JVD, no carotid bruits, no thyromegaly, no lymphadenopathy.  Lungs:Clear bilaterally, no wheezes, rhonci, crackles Cardiovascular: Irreg irreg. No murmurs, gallops or rubs. Abdomen:Soft. Bowel sounds present. Non-tender.  Extremities: No lower extremity edema. Pulses are 2 + in the bilateral DP/PT.  EKG:  EKG is ordered today. The ekg ordered today demonstrates  EKG Interpretation Date/Time:  Friday October 29 2022 13:39:39 EDT Ventricular Rate:  76 PR Interval:  118 QRS Duration:  98 QT Interval:  396 QTC Calculation: 445 R Axis:   53  Text Interpretation: Atrial fibrillation Confirmed by Verne Carrow [phone]) on 10/29/2022 1:41:32 PM    Recent Labs: 08/06/2022: B Natriuretic Peptide 128.6 08/11/2022: Magnesium 2.1 09/20/2022: ALT 8; Hemoglobin 12.6; Platelets 228.0; TSH 1.59 10/06/2022: BUN 18; Creatinine, Ser 0.98; Potassium 4.5; Sodium 135   Lipid Panel    Component Value Date/Time   CHOL 162 09/20/2022 1551   TRIG 97.0 09/20/2022 1551   HDL 57.60 09/20/2022 1551   CHOLHDL 3 09/20/2022 1551   VLDL 19.4 09/20/2022 1551   LDLCALC 85 09/20/2022 1551   LDLCALC 121 (H) 07/18/2019 1422   LDLDIRECT 133.0  12/22/2012 0815     Wt Readings from Last 3 Encounters:  10/29/22 85.2 kg  09/20/22 83 kg  08/06/22 77.6 kg    Assessment and Plan:   1. Atrial fibrillation, new onset: Rate is controlled on low dose beta blocker. He remains on Eliquis. CHADS VASC score 2. I would recommend continuing Eliquis. He can hold this 2 days prior to his planned hernia surgery  2. Pre-operative cardiovascular risk assessment: He is a functional 88 year old with no chest pain or evidence of CHF. His atrial fibrillation will not make him a prohibitive candidate for the planned hernia surgery. OK to proceed with surgery. OK to hold Eliquis 2 days prior to his surgery.   Labs/ tests ordered today include:   Orders Placed This Encounter  Procedures   EKG 12-Lead   EKG 12-Lead   Disposition:   F/U with me in 6 months    Signed, Verne Carrow, MD, [facility] 10/29/2022 2:03 PM    [facility] [address] [place], [place], [place]  [address] Phone: [phone]; Fax: [phone]

## 2022-10-29 ENCOUNTER — Encounter: Payer: Self-pay | Admitting: Cardiovascular Disease

## 2022-10-29 ENCOUNTER — Ambulatory Visit: Payer: Medicare Other | Attending: Cardiovascular Disease | Admitting: Cardiovascular Disease

## 2022-10-29 VITALS — BP 112/62 | HR 76 | Ht 74.0 in | Wt 187.8 lb

## 2022-10-29 DIAGNOSIS — Z01810 Encounter for preprocedural cardiovascular examination: Secondary | ICD-10-CM

## 2022-10-29 DIAGNOSIS — I4891 Unspecified atrial fibrillation: Secondary | ICD-10-CM | POA: Diagnosis not present

## 2022-10-29 NOTE — Patient Instructions (Signed)
Medication Instructions:  No changes - okay to hold Eliquis 2 days before surgery date  *If you need a refill on your cardiac medications before your next appointment, please call your pharmacy*   Lab Work: none If you have labs (blood work) drawn today and your tests are completely normal, you will receive your results only by: MyChart Message (if you have MyChart) OR A paper copy in the mail If you have any lab test that is abnormal or we need to change your treatment, we will call you to review the results.   Testing/Procedures: none   Follow-Up: At [facility], you and your health needs are our priority.  As part of our continuing mission to provide you with exceptional heart care, we have created designated Provider Care Teams.  These Care Teams include your primary Cardiologist (physician) and Advanced Practice Providers (APPs -  Physician Assistants and Nurse Practitioners) who all work together to provide you with the care you need, when you need it.  We recommend signing up for the patient portal called "MyChart".  Sign up information is provided on this After Visit Summary.  MyChart is used to connect with patients for Virtual Visits (Telemedicine).  Patients are able to view lab/test results, encounter notes, upcoming appointments, etc.  Non-urgent messages can be sent to your provider as well.   To learn more about what you can do with MyChart, go to [email or web address].    Your next appointment:   6 month(s)  Provider:   Verne Carrow, MD

## 2022-11-03 NOTE — Progress Notes (Signed)
COVID Vaccine Completed: yes  Date of COVID positive in last 90 days:  PCP - Oliver Barre, MD Cardiologist - Verne Carrow, MD  Cardiac clearance by Dr. Clifton James 10/29/22 in Epic  Chest x-ray - 08/06/22 Epic EKG - 10/29/22 Epic Stress Test - n/a ECHO - 08/07/22 Epic Cardiac Cath - n/a Pacemaker/ICD device last checked: n/a Spinal Cord Stimulator: n/a  Bowel Prep - no  Sleep Study - n/a CPAP -   Fasting Blood Sugar - n/a Checks Blood Sugar _____ times a day  Last dose of GLP1 agonist-  N/A GLP1 instructions:  N/A   Last dose of SGLT-2 inhibitors-  N/A SGLT-2 instructions: N/A   Blood Thinner Instructions:  Eliquis, hold 2 days Aspirin Instructions: Last Dose: 11/07/22 2000  Activity level: Can perform activities of daily living without stopping and without symptoms of chest pain or shortness of breath. No stairs usually, can do stairs with hand rails   Anesthesia review: a fib, anemia, edema,   Patient denies shortness of breath, fever, cough and chest pain at PAT appointment  Patient verbalized understanding of instructions that were given to them at the PAT appointment. Patient was also instructed that they will need to review over the PAT instructions again at home before surgery.

## 2022-11-04 ENCOUNTER — Encounter (HOSPITAL_COMMUNITY): Payer: Self-pay

## 2022-11-04 ENCOUNTER — Other Ambulatory Visit: Payer: Self-pay

## 2022-11-04 ENCOUNTER — Encounter (HOSPITAL_COMMUNITY)
Admission: RE | Admit: 2022-11-04 | Discharge: 2022-11-04 | Disposition: A | Discharge status: Home / Self Care | Payer: Medicare Other | Source: Ambulatory Visit | Attending: Surgery | Admitting: Surgery

## 2022-11-04 VITALS — BP 116/57 | HR 78 | Temp 97.5°F | Resp 16 | Ht 72.0 in | Wt 187.8 lb

## 2022-11-04 DIAGNOSIS — I4891 Unspecified atrial fibrillation: Secondary | ICD-10-CM | POA: Diagnosis not present

## 2022-11-04 DIAGNOSIS — Z01812 Encounter for preprocedural laboratory examination: Secondary | ICD-10-CM | POA: Insufficient documentation

## 2022-11-04 DIAGNOSIS — Z87891 Personal history of nicotine dependence: Secondary | ICD-10-CM | POA: Diagnosis not present

## 2022-11-04 DIAGNOSIS — K4090 Unilateral inguinal hernia, without obstruction or gangrene, not specified as recurrent: Secondary | ICD-10-CM | POA: Diagnosis not present

## 2022-11-04 LAB — CBC
HCT: 36.9 % — ABNORMAL LOW (ref 39.0–52.0)
Hemoglobin: 12.4 g/dL — ABNORMAL LOW (ref 13.0–17.0)
MCH: 31.6 pg (ref 26.0–34.0)
MCHC: 33.6 g/dL (ref 30.0–36.0)
MCV: 94.1 fL (ref 80.0–100.0)
Platelets: 245 10*3/uL (ref 150–400)
RBC: 3.92 MIL/uL — ABNORMAL LOW (ref 4.22–5.81)
RDW: 14.1 % (ref 11.5–15.5)
WBC: 6.9 10*3/uL (ref 4.0–10.5)
nRBC: 0 % (ref 0.0–0.2)

## 2022-11-04 LAB — BASIC METABOLIC PANEL
Anion gap: 8 (ref 5–15)
BUN: 19 mg/dL (ref 8–23)
CO2: 24 mmol/L (ref 22–32)
Calcium: 9.4 mg/dL (ref 8.9–10.3)
Chloride: 103 mmol/L (ref 98–111)
Creatinine, Ser: 0.89 mg/dL (ref 0.61–1.24)
GFR, Estimated: >60 mL/min (ref >60)
Glucose, Bld: 114 mg/dL — ABNORMAL HIGH (ref 70–99)
Potassium: 4.8 mmol/L (ref 3.5–5.1)
Sodium: 135 mmol/L (ref 135–145)

## 2022-11-04 NOTE — Patient Instructions (Addendum)
SURGICAL WAITING ROOM VISITATION  Patients having surgery or a procedure may have no more than 2 support people in the waiting area - these visitors may rotate.    Children under the age of 22 must have an adult with them who is not the patient.  Due to an increase in RSV and influenza rates and associated hospitalizations, children ages 69 and under may not visit patients in [facility] hospitals.  If the patient needs to stay at the hospital during part of their recovery, the visitor guidelines for inpatient rooms apply. Pre-op nurse will coordinate an appropriate time for 1 support person to accompany patient in pre-op.  This support person may not rotate.    Please refer to the [facility] website for the visitor guidelines for Inpatients (after your surgery is over and you are in a regular room).    Your procedure is scheduled on: 11/10/22   Report to [facility] Main Entrance    Report to admitting at 6:00 AM   Call this number if you have problems the morning of surgery [phone]   Do not eat food :After Midnight.   After Midnight you may have the following liquids until 5:30 AM DAY OF SURGERY  Water Non-Citrus Juices (without pulp, NO RED-Apple, White grape, White cranberry) Black Coffee (NO MILK/CREAM OR CREAMERS, sugar ok)  Clear Tea (NO MILK/CREAM OR CREAMERS, sugar ok) regular and decaf                             Plain Jell-O (NO RED)                                           Fruit ices (not with fruit pulp, NO RED)                                     Popsicles (NO RED)                                                               Sports drinks like Gatorade (NO RED)               The day of surgery:  Drink ONE (1) Pre-Surgery Clear Ensure at 5:30 AM the morning of surgery. Drink in one sitting. Do not sip.  This drink was given to you during your hospital  pre-op appointment visit. Nothing else to drink after completing the  Pre-Surgery Clear Ensure.           If you have questions, please contact your surgeon's office.   FOLLOW BOWEL PREP AND ANY ADDITIONAL PRE OP INSTRUCTIONS YOU RECEIVED FROM YOUR SURGEON'S OFFICE!!!     Oral Hygiene is also important to reduce your risk of infection.                                    Remember - BRUSH YOUR TEETH THE MORNING OF SURGERY WITH YOUR REGULAR TOOTHPASTE  DENTURES WILL BE REMOVED  PRIOR TO SURGERY PLEASE DO NOT APPLY "Poly grip" OR ADHESIVES!!!   Stop all vitamins and herbal supplements 7 days before surgery.   Take these medicines the morning of surgery with A SIP OF WATER: Finasteride, Metoprolol, Omeprazole  These are anesthesia recommendations for holding your anticoagulants.  Please contact your prescribing physician to confirm IF it is safe to hold your anticoagulants for this length of time.   Eliquis Apixaban   72 hours   Xarelto Rivaroxaban   72 hours  Plavix Clopidogrel   120 hours  Pletal Cilostazol   120 hours                                You may not have any metal on your body including hair pins, jewelry, and body piercing             Do not wear lotions, powders, cologne, or deodorant              Men may shave face and neck.   Do not bring valuables to the hospital. [facility] IS NOT             RESPONSIBLE   FOR VALUABLES.   Contacts, glasses, dentures or bridgework may not be worn into surgery.   Bring small overnight bag day of surgery.   DO NOT BRING YOUR HOME MEDICATIONS TO THE HOSPITAL. PHARMACY WILL DISPENSE MEDICATIONS LISTED ON YOUR MEDICATION LIST TO YOU DURING YOUR ADMISSION IN THE HOSPITAL!              Please read over the following fact sheets you were given: IF YOU HAVE QUESTIONS ABOUT YOUR PRE-OP INSTRUCTIONS PLEASE CALL [phone] Contras    If you received a COVID test during your pre-op visit  it is requested that you wear a mask when out in public, stay away from anyone that may not be feeling well and notify your surgeon if you  develop symptoms. If you test positive for Covid or have been in contact with anyone that has tested positive in the last 10 days please notify you surgeon.    [facility] - Preparing for Surgery Before surgery, you can play an important role.  Because skin is not sterile, your skin needs to be as free of germs as possible.  You can reduce the number of germs on your skin by washing with CHG (chlorahexidine gluconate) soap before surgery.  CHG is an antiseptic cleaner which kills germs and bonds with the skin to continue killing germs even after washing. Please DO NOT use if you have an allergy to CHG or antibacterial soaps.  If your skin becomes reddened/irritated stop using the CHG and inform your nurse when you arrive at Short Stay. Do not shave (including legs and underarms) for at least 48 hours prior to the first CHG shower.  You may shave your face/neck.  Please follow these instructions carefully:  1.  Shower with CHG Soap the night before surgery and the  morning of surgery.  2.  If you choose to wash your hair, wash your hair first as usual with your normal  shampoo.  3.  After you shampoo, rinse your hair and body thoroughly to remove the shampoo.                             4.  Use CHG as you would  any other liquid soap.  You can apply chg directly to the skin and wash.  Gently with a scrungie or clean washcloth.  5.  Apply the CHG Soap to your body ONLY FROM THE NECK DOWN.   Do   not use on face/ open                           Wound or open sores. Avoid contact with eyes, ears mouth and   genitals (private parts).                       Wash face,  Genitals (private parts) with your normal soap.             6.  Wash thoroughly, paying special attention to the area where your    surgery  will be performed.  7.  Thoroughly rinse your body with warm water from the neck down.  8.  DO NOT shower/wash with your normal soap after using and rinsing off the CHG Soap.                9.  Pat  yourself dry with a clean towel.            10.  Wear clean pajamas.            11.  Place clean sheets on your bed the night of your first shower and do not  sleep with pets. Day of Surgery : Do not apply any lotions/deodorants the morning of surgery.  Please wear clean clothes to the hospital/surgery center.  FAILURE TO FOLLOW THESE INSTRUCTIONS MAY RESULT IN THE CANCELLATION OF YOUR SURGERY  PATIENT SIGNATURE_________________________________  NURSE SIGNATURE__________________________________  ________________________________________________________________________

## 2022-11-05 NOTE — Anesthesia Preprocedure Evaluation (Signed)
Anesthesia Evaluation  Patient identified by MRN, date of birth, ID band Patient awake    Reviewed: Allergy & Precautions, H&P , NPO status , Patient's Chart, lab work & pertinent test results  History of Anesthesia Complications (+) PONV  Airway Mallampati: II  TM Distance: >3 FB Neck ROM: Full    Dental no notable dental hx.    Pulmonary former smoker   Pulmonary exam normal breath sounds clear to auscultation       Cardiovascular negative cardio ROS Normal cardiovascular exam Rhythm:Regular Rate:Normal     Neuro/Psych  PSYCHIATRIC DISORDERS  Depression    negative neurological ROS     GI/Hepatic Neg liver ROS, hiatal hernia,GERD  ,,  Endo/Other  negative endocrine ROS    Renal/GU negative Renal ROS  negative genitourinary   Musculoskeletal  (+) Arthritis ,    Abdominal   Peds negative pediatric ROS (+)  Hematology  (+) Blood dyscrasia, anemia   Anesthesia Other Findings   Reproductive/Obstetrics negative OB ROS                              Anesthesia Physical Anesthesia Plan  ASA: 3  Anesthesia Plan: General   Post-op Pain Management: Regional block*   Induction: Intravenous  PONV Risk Score and Plan: Ondansetron and Dexamethasone  Airway Management Planned: LMA  Additional Equipment:   Intra-op Plan:   Post-operative Plan: Extubation in OR  Informed Consent: I have reviewed the patients History and Physical, chart, labs and discussed the procedure including the risks, benefits and alternatives for the proposed anesthesia with the patient or authorized representative who has indicated his/her understanding and acceptance.     Dental advisory given  Plan Discussed with: CRNA  Anesthesia Plan Comments: (See PAT note 11/04/2022  88 y.o. former smoker with h/o PONV, atrial fibrillation, right inguinal hernia scheduled for above procedure 11/10/2022 with Dr.  Manus Rudd.    Pt last seen by cardiology 10/29/2022. Per OV note, "Pre-operative cardiovascular risk assessment: He is a functional 88 year old with no chest pain or evidence of CHF. His atrial fibrillation will not make him a prohibitive candidate for the planned hernia surgery. OK to proceed with surgery. OK to hold Eliquis 2 days prior to his surgery." )        Anesthesia Quick Evaluation

## 2022-11-05 NOTE — Progress Notes (Signed)
Anesthesia Chart Review   Case: [id number] Date/Time: 11/10/22 0815   Procedure: RIGHT INGUINAL HERNIA REPAIR WITH MESH (Right) - LMA & TAP BLOCK   Anesthesia type: General   Pre-op diagnosis: right inguinal hernia   Location: [facility] ROOM 04 / [facility]   Surgeons: Manus Rudd, MD       DISCUSSION:88 y.o. former smoker with h/o PONV, atrial fibrillation, right inguinal hernia scheduled for above procedure 11/10/2022 with Dr. Manus Rudd.   Pt last seen by cardiology 10/29/2022. Per OV note, "Pre-operative cardiovascular risk assessment: He is a functional 88 year old with no chest pain or evidence of CHF. His atrial fibrillation will not make him a prohibitive candidate for the planned hernia surgery. OK to proceed with surgery. OK to hold Eliquis 2 days prior to his surgery."  VS: BP (!) 116/57   Pulse 78   Temp (!) 36.4 C (Oral)   Resp 16   Ht 6' (1.829 m)   Wt 85.2 kg   SpO2 99%   BMI 25.47 kg/m   PROVIDERS: Corwin Levins, MD is PCP   Kathleene Hazel, MD is Cardiologist  LABS: Labs reviewed: Acceptable for surgery. (all labs ordered are listed, but only abnormal results are displayed)  Labs Reviewed  CBC - Abnormal; Notable for the following components:      Result Value   RBC 3.92 (*)    Hemoglobin 12.4 (*)    HCT 36.9 (*)    All other components within normal limits  BASIC METABOLIC PANEL - Abnormal; Notable for the following components:   Glucose, Bld 114 (*)    All other components within normal limits     IMAGES:   EKG:   CV: Echo 08/07/2022 1. Left ventricular ejection fraction, by estimation, is 50 to 55%. The  left ventricle has low normal function. The left ventricle has no regional  wall motion abnormalities. Left ventricular diastolic function could not  be evaluated.   2. Right ventricular systolic function is normal. The right ventricular  size is mildly enlarged. There is normal pulmonary artery systolic  pressure.   3. The mitral  valve is normal in structure. Trivial mitral valve  regurgitation. No evidence of mitral stenosis.   4. The aortic valve is tricuspid. Aortic valve regurgitation is not  visualized. No aortic stenosis is present.   5. Aortic dilatation noted. There is mild dilatation of the ascending  aorta, measuring 42 mm.   6. The inferior vena cava is dilated in size with <50% respiratory  variability, suggesting right atrial pressure of 15 mmHg.   Comparison(s): Changes from prior study are noted. LVEF is 50-55% now  compared to 60-65% in 2020.  Past Medical History:  Diagnosis Date   Arthritis    Atrial fibrillation (HCC)    Blood in stool 02/12/2010   Qualifier: Diagnosis of  By: Misty Stanley CMA (AAMA), Amanda     BPH (benign prostatic hyperplasia) 01/18/2020   Complication of anesthesia    Diverticulosis    Esophageal stricture    GERD 02/12/2010   Qualifier: Diagnosis of  By: Misty Stanley CMA (AAMA), Marchelle Folks     GERD (gastroesophageal reflux disease)    Hemorrhoids    Hiatal hernia    Idiopathic Charcot's joint of foot 12/27/2012   Mallory - Weiss tear 1981   OAB (overactive bladder) 12/27/2012   Osteoarthritis of right hip 09/19/2014   PONV (postoperative nausea and vomiting)    Pure hypercholesterolemia 12/27/2012    Past Surgical History:  Procedure Laterality Date   CHOLECYSTECTOMY     COLONOSCOPY     GASTROCNEMIUS RECESSION  02/03/2011   Procedure: GASTROCNEMIUS SLIDE;  Surgeon: Sherri Rad, MD;  Location: [facility];  Service: Orthopedics;  Laterality: Right;   HAND SURGERY  jan 2016   rt hand   HEMORRHOID SURGERY  1990   TOTAL HIP ARTHROPLASTY Right 09/19/2014   Procedure: RIGHT TOTAL HIP ARTHROPLASTY ANTERIOR APPROACH;  Surgeon: Samson Frederic, MD;  Location: [facility];  Service: Orthopedics;  Laterality: Right;   UMBILICAL HERNIA REPAIR     UPPER GASTROINTESTINAL ENDOSCOPY      MEDICATIONS:  apixaban (ELIQUIS) 5 MG TABS tablet   cyanocobalamin (VITAMIN B12)  1000 MCG tablet   diclofenac Sodium (VOLTAREN) 1 % GEL   finasteride (PROSCAR) 5 MG tablet   fish oil-omega-3 fatty acids 1000 MG capsule   hydrochlorothiazide (MICROZIDE) 12.5 MG capsule   metoprolol tartrate (LOPRESSOR) 25 MG tablet   mirtazapine (REMERON) 7.5 MG tablet   Multiple Vitamins-Minerals (MULTIVITAMIN ADULTS PO)   omeprazole (PRILOSEC) 20 MG capsule   tamsulosin (FLOMAX) 0.4 MG CAPS capsule   temazepam (RESTORIL) 15 MG capsule   triamcinolone cream (KENALOG) 0.1 %   TYLENOL 8 HOUR ARTHRITIS PAIN 650 MG CR tablet   No current facility-administered medications for this encounter.    Jodell Cipro Ward, PA-C [facility] [phone]

## 2022-11-10 ENCOUNTER — Ambulatory Visit (HOSPITAL_COMMUNITY): Payer: Medicare Other | Admitting: Physician Assistant

## 2022-11-10 ENCOUNTER — Other Ambulatory Visit: Payer: Self-pay

## 2022-11-10 ENCOUNTER — Ambulatory Visit (HOSPITAL_BASED_OUTPATIENT_CLINIC_OR_DEPARTMENT_OTHER): Payer: Medicare Other | Admitting: Anesthesiology

## 2022-11-10 ENCOUNTER — Observation Stay (HOSPITAL_COMMUNITY)
Admission: RE | Admit: 2022-11-10 | Discharge: 2022-11-11 | Disposition: A | Discharge status: Home / Self Care | Payer: Medicare Other | Attending: Surgery | Admitting: Surgery

## 2022-11-10 ENCOUNTER — Encounter (HOSPITAL_COMMUNITY)
Admission: RE | Disposition: A | Discharge status: Home / Self Care | Payer: Self-pay | Source: Home / Self Care | Attending: Surgery

## 2022-11-10 ENCOUNTER — Encounter (HOSPITAL_COMMUNITY): Payer: Self-pay | Admitting: Surgery

## 2022-11-10 DIAGNOSIS — D500 Iron deficiency anemia secondary to blood loss (chronic): Secondary | ICD-10-CM

## 2022-11-10 DIAGNOSIS — K4090 Unilateral inguinal hernia, without obstruction or gangrene, not specified as recurrent: Secondary | ICD-10-CM | POA: Diagnosis not present

## 2022-11-10 DIAGNOSIS — G8918 Other acute postprocedural pain: Secondary | ICD-10-CM | POA: Diagnosis not present

## 2022-11-10 DIAGNOSIS — Z79899 Other long term (current) drug therapy: Secondary | ICD-10-CM | POA: Insufficient documentation

## 2022-11-10 DIAGNOSIS — I4891 Unspecified atrial fibrillation: Secondary | ICD-10-CM | POA: Insufficient documentation

## 2022-11-10 DIAGNOSIS — Z7901 Long term (current) use of anticoagulants: Secondary | ICD-10-CM | POA: Diagnosis not present

## 2022-11-10 DIAGNOSIS — Z87891 Personal history of nicotine dependence: Secondary | ICD-10-CM | POA: Diagnosis not present

## 2022-11-10 DIAGNOSIS — Z96641 Presence of right artificial hip joint: Secondary | ICD-10-CM | POA: Diagnosis not present

## 2022-11-10 HISTORY — PX: INGUINAL HERNIA REPAIR: SHX194

## 2022-11-10 SURGERY — REPAIR, HERNIA, INGUINAL, ADULT
Anesthesia: General | Laterality: Right

## 2022-11-10 MED ORDER — KETOROLAC TROMETHAMINE 15 MG/ML IJ SOLN
INTRAMUSCULAR | Status: AC
  Filled 2022-11-10: qty 1

## 2022-11-10 MED ORDER — PROPOFOL 10 MG/ML IV BOLUS
INTRAVENOUS | Status: DC | PRN
  Administered 2022-11-10: 100 mg via INTRAVENOUS

## 2022-11-10 MED ORDER — FENTANYL CITRATE (PF) 100 MCG/2ML IJ SOLN
INTRAMUSCULAR | Status: DC | PRN
  Administered 2022-11-10 (×3): 50 ug via INTRAVENOUS

## 2022-11-10 MED ORDER — OXYCODONE HCL 5 MG PO TABS
ORAL_TABLET | ORAL | Status: AC
  Administered 2022-11-10: 5 mg via ORAL
  Filled 2022-11-10: qty 1

## 2022-11-10 MED ORDER — ORAL CARE MOUTH RINSE: 15.0000 mL | Freq: Once | OROMUCOSAL | Status: AC

## 2022-11-10 MED ORDER — DIPHENHYDRAMINE HCL 50 MG/ML IJ SOLN: 12.5000 mg | Freq: Four times a day (QID) | INTRAMUSCULAR | Status: DC | PRN

## 2022-11-10 MED ORDER — DEXAMETHASONE SODIUM PHOSPHATE 4 MG/ML IJ SOLN
INTRAMUSCULAR | Status: DC | PRN
  Administered 2022-11-10: 4 mg via INTRAVENOUS

## 2022-11-10 MED ORDER — ACETAMINOPHEN 500 MG PO TABS: 1000.0000 mg | ORAL_TABLET | ORAL | Status: AC

## 2022-11-10 MED ORDER — PHENYLEPHRINE HCL (PRESSORS) 10 MG/ML IV SOLN
INTRAVENOUS | Status: DC | PRN
  Administered 2022-11-10 (×3): 120 ug via INTRAVENOUS

## 2022-11-10 MED ORDER — ACETAMINOPHEN 10 MG/ML IV SOLN: 1000.0000 mg | Freq: Once | INTRAVENOUS | Status: DC | PRN

## 2022-11-10 MED ORDER — LACTATED RINGERS IV SOLN: INTRAVENOUS | Status: DC

## 2022-11-10 MED ORDER — TRAMADOL HCL 50 MG PO TABS
50.0000 mg | ORAL_TABLET | Freq: Four times a day (QID) | ORAL | Status: DC | PRN
  Administered 2022-11-10: 50 mg via ORAL
  Filled 2022-11-10: qty 1

## 2022-11-10 MED ORDER — DROPERIDOL 2.5 MG/ML IJ SOLN: 0.6250 mg | Freq: Once | INTRAMUSCULAR | Status: DC | PRN

## 2022-11-10 MED ORDER — FINASTERIDE 5 MG PO TABS
5.0000 mg | ORAL_TABLET | Freq: Every day | ORAL | Status: DC
  Administered 2022-11-10 – 2022-11-11 (×2): 5 mg via ORAL
  Filled 2022-11-10 (×2): qty 1

## 2022-11-10 MED ORDER — CHLORHEXIDINE GLUCONATE CLOTH 2 % EX PADS: 6.0000 | MEDICATED_PAD | Freq: Once | CUTANEOUS | Status: DC

## 2022-11-10 MED ORDER — CEFAZOLIN SODIUM-DEXTROSE 2-4 GM/100ML-% IV SOLN
2.0000 g | INTRAVENOUS | Status: AC
  Administered 2022-11-10: 2 g via INTRAVENOUS

## 2022-11-10 MED ORDER — FENTANYL CITRATE (PF) 100 MCG/2ML IJ SOLN
INTRAMUSCULAR | Status: AC
  Filled 2022-11-10: qty 2

## 2022-11-10 MED ORDER — FENTANYL CITRATE PF 50 MCG/ML IJ SOSY
25.0000 ug | PREFILLED_SYRINGE | INTRAMUSCULAR | Status: DC | PRN
  Administered 2022-11-10: 50 ug via INTRAVENOUS

## 2022-11-10 MED ORDER — PROPOFOL 10 MG/ML IV BOLUS
INTRAVENOUS | Status: AC
Start: 2022-11-10 — End: ?
  Filled 2022-11-10: qty 20

## 2022-11-10 MED ORDER — BUPIVACAINE HCL (PF) 0.25 % IJ SOLN
INTRAMUSCULAR | Status: DC | PRN
  Administered 2022-11-10: 15 mL via EPIDURAL

## 2022-11-10 MED ORDER — BUPIVACAINE HCL (PF) 0.25 % IJ SOLN
INTRAMUSCULAR | Status: DC | PRN
  Administered 2022-11-10: 10 mL

## 2022-11-10 MED ORDER — CEFAZOLIN SODIUM-DEXTROSE 2-4 GM/100ML-% IV SOLN
INTRAVENOUS | Status: AC
  Filled 2022-11-10: qty 100

## 2022-11-10 MED ORDER — PANTOPRAZOLE SODIUM 40 MG PO TBEC
40.0000 mg | DELAYED_RELEASE_TABLET | Freq: Every day | ORAL | Status: DC
  Administered 2022-11-10 – 2022-11-11 (×2): 40 mg via ORAL
  Filled 2022-11-10 (×2): qty 1

## 2022-11-10 MED ORDER — ONDANSETRON HCL 4 MG/2ML IJ SOLN
INTRAMUSCULAR | Status: DC | PRN
  Administered 2022-11-10: 4 mg via INTRAVENOUS

## 2022-11-10 MED ORDER — LIDOCAINE HCL (CARDIAC) PF 100 MG/5ML IV SOSY
PREFILLED_SYRINGE | INTRAVENOUS | Status: DC | PRN
  Administered 2022-11-10: 100 mg via INTRAVENOUS

## 2022-11-10 MED ORDER — ONDANSETRON 4 MG PO TBDP
4.0000 mg | ORAL_TABLET | Freq: Four times a day (QID) | ORAL | Status: DC | PRN
  Administered 2022-11-10 (×2): 4 mg via ORAL
  Filled 2022-11-10 (×2): qty 1

## 2022-11-10 MED ORDER — DIPHENHYDRAMINE HCL 12.5 MG/5ML PO ELIX: 12.5000 mg | ORAL_SOLUTION | Freq: Four times a day (QID) | ORAL | Status: DC | PRN

## 2022-11-10 MED ORDER — OXYCODONE HCL 5 MG PO TABS: 5.0000 mg | ORAL_TABLET | Freq: Once | ORAL | Status: AC | PRN

## 2022-11-10 MED ORDER — DOCUSATE SODIUM 100 MG PO CAPS
100.0000 mg | ORAL_CAPSULE | Freq: Two times a day (BID) | ORAL | Status: DC
  Administered 2022-11-10 – 2022-11-11 (×3): 100 mg via ORAL
  Filled 2022-11-10 (×4): qty 1

## 2022-11-10 MED ORDER — OXYCODONE HCL 5 MG/5ML PO SOLN
5.0000 mg | Freq: Once | ORAL | Status: AC | PRN
Start: 2022-11-10 — End: 2022-11-10

## 2022-11-10 MED ORDER — KETOROLAC TROMETHAMINE 15 MG/ML IJ SOLN
15.0000 mg | Freq: Once | INTRAMUSCULAR | Status: AC
  Administered 2022-11-10: 15 mg via INTRAVENOUS

## 2022-11-10 MED ORDER — 0.9 % SODIUM CHLORIDE (POUR BTL) OPTIME
TOPICAL | Status: DC | PRN
  Administered 2022-11-10: 500 mL

## 2022-11-10 MED ORDER — TAMSULOSIN HCL 0.4 MG PO CAPS
0.4000 mg | ORAL_CAPSULE | Freq: Every day | ORAL | Status: DC
  Administered 2022-11-10: 0.4 mg via ORAL
  Filled 2022-11-10: qty 1

## 2022-11-10 MED ORDER — FENTANYL CITRATE PF 50 MCG/ML IJ SOSY
PREFILLED_SYRINGE | INTRAMUSCULAR | Status: AC
  Administered 2022-11-10: 50 ug via INTRAVENOUS
  Filled 2022-11-10: qty 2

## 2022-11-10 MED ORDER — ACETAMINOPHEN 500 MG PO TABS
1000.0000 mg | ORAL_TABLET | Freq: Four times a day (QID) | ORAL | Status: DC
  Administered 2022-11-10 – 2022-11-11 (×3): 1000 mg via ORAL
  Filled 2022-11-10 (×5): qty 2

## 2022-11-10 MED ORDER — KETAMINE HCL 50 MG/5ML IJ SOSY
PREFILLED_SYRINGE | INTRAMUSCULAR | Status: AC
Start: 2022-11-10 — End: ?
  Filled 2022-11-10: qty 5

## 2022-11-10 MED ORDER — HYDROCODONE-ACETAMINOPHEN 5-325 MG PO TABS
1.0000 | ORAL_TABLET | ORAL | Status: DC | PRN
  Administered 2022-11-10: 2 via ORAL
  Filled 2022-11-10 (×2): qty 1

## 2022-11-10 MED ORDER — TEMAZEPAM 15 MG PO CAPS: 15.0000 mg | ORAL_CAPSULE | Freq: Every evening | ORAL | Status: DC | PRN

## 2022-11-10 MED ORDER — BUPIVACAINE HCL (PF) 0.25 % IJ SOLN
INTRAMUSCULAR | Status: AC
Start: 2022-11-10 — End: ?
  Filled 2022-11-10: qty 30

## 2022-11-10 MED ORDER — ACETAMINOPHEN 500 MG PO TABS
ORAL_TABLET | ORAL | Status: AC
  Administered 2022-11-10: 1000 mg via ORAL
  Filled 2022-11-10: qty 2

## 2022-11-10 MED ORDER — CHLORHEXIDINE GLUCONATE 0.12 % MT SOLN
15.0000 mL | Freq: Once | OROMUCOSAL | Status: AC
  Administered 2022-11-10: 15 mL via OROMUCOSAL

## 2022-11-10 MED ORDER — MORPHINE SULFATE (PF) 2 MG/ML IV SOLN: 2.0000 mg | INTRAVENOUS | Status: DC | PRN

## 2022-11-10 MED ORDER — KETAMINE HCL 10 MG/ML IJ SOLN
INTRAMUSCULAR | Status: DC | PRN
  Administered 2022-11-10: 20 mg via INTRAVENOUS

## 2022-11-10 MED ORDER — HYDROCHLOROTHIAZIDE 12.5 MG PO TABS
12.5000 mg | ORAL_TABLET | Freq: Every day | ORAL | Status: DC
  Administered 2022-11-10 – 2022-11-11 (×2): 12.5 mg via ORAL
  Filled 2022-11-10 (×2): qty 1

## 2022-11-10 SURGICAL SUPPLY — 44 items
APL PRP STRL LF DISP 70% ISPRP (MISCELLANEOUS) ×1
APL SKNCLS STERI-STRIP NONHPOA (GAUZE/BANDAGES/DRESSINGS) ×1
BAG COUNTER SPONGE SURGICOUNT (BAG) IMPLANT
BAG SPNG CNTER NS LX DISP (BAG)
BENZOIN TINCTURE PRP APPL 2/3 (GAUZE/BANDAGES/DRESSINGS) ×2 IMPLANT
BLADE HEX COATED 2.75 (ELECTRODE) ×2 IMPLANT
CHLORAPREP W/TINT 26 (MISCELLANEOUS) ×2 IMPLANT
COVER SURGICAL LIGHT HANDLE (MISCELLANEOUS) ×2 IMPLANT
DISSECTOR ROUND CHERRY 3/8 STR (MISCELLANEOUS) IMPLANT
DRAIN PENROSE 0.5X18 (DRAIN) IMPLANT
DRAPE LAPAROTOMY TRNSV 102X78 (DRAPES) ×2 IMPLANT
DRAPE UTILITY XL STRL (DRAPES) ×2 IMPLANT
DRSG TEGADERM 4X4.75 (GAUZE/BANDAGES/DRESSINGS) IMPLANT
ELECT REM PT RETURN 15FT ADLT (MISCELLANEOUS) ×2 IMPLANT
GAUZE 4X4 16PLY ~~LOC~~+RFID DBL (SPONGE) IMPLANT
GAUZE SPONGE 4X4 12PLY STRL (GAUZE/BANDAGES/DRESSINGS) ×2 IMPLANT
GLOVE BIO SURGEON STRL SZ7 (GLOVE) ×2 IMPLANT
GLOVE BIOGEL PI IND STRL 7.5 (GLOVE) ×2 IMPLANT
GOWN STRL REUS W/ TWL LRG LVL3 (GOWN DISPOSABLE) IMPLANT
GOWN STRL REUS W/TWL LRG LVL3 (GOWN DISPOSABLE)
KIT BASIN OR (CUSTOM PROCEDURE TRAY) ×2 IMPLANT
KIT TURNOVER KIT A (KITS) IMPLANT
MARKER SKIN DUAL TIP RULER LAB (MISCELLANEOUS) ×2 IMPLANT
MESH PARIETEX PROGRIP RIGHT (Mesh General) IMPLANT
NDL HYPO 25X1 1.5 SAFETY (NEEDLE) ×2 IMPLANT
NEEDLE HYPO 25X1 1.5 SAFETY (NEEDLE) ×1
PACK GENERAL/GYN (CUSTOM PROCEDURE TRAY) ×2 IMPLANT
SPIKE FLUID TRANSFER (MISCELLANEOUS) ×2 IMPLANT
STRIP CLOSURE SKIN 1/2X4 (GAUZE/BANDAGES/DRESSINGS) ×2 IMPLANT
SUT MNCRL AB 4-0 PS2 18 (SUTURE) ×2 IMPLANT
SUT PROLENE 2 0 SH DA (SUTURE) IMPLANT
SUT SILK 3 0 (SUTURE)
SUT SILK 3-0 18XBRD TIE 12 (SUTURE) IMPLANT
SUT VIC AB 2-0 CT2 27 (SUTURE) IMPLANT
SUT VIC AB 2-0 SH 27 (SUTURE) ×1
SUT VIC AB 2-0 SH 27X BRD (SUTURE) ×2 IMPLANT
SUT VIC AB 3-0 SH 27 (SUTURE) ×1
SUT VIC AB 3-0 SH 27X BRD (SUTURE) ×2 IMPLANT
SUT VICRYL 0 27 CT2 27 ABS (SUTURE) IMPLANT
SUT VICRYL 0 UR6 27IN ABS (SUTURE) IMPLANT
SYR 20ML LL LF (SYRINGE) ×2 IMPLANT
TAPE STRIPS DRAPE STRL (GAUZE/BANDAGES/DRESSINGS) IMPLANT
TOWEL OR 17X26 10 PK STRL BLUE (TOWEL DISPOSABLE) ×2 IMPLANT
TOWEL OR NON WOVEN STRL DISP B (DISPOSABLE) ×2 IMPLANT

## 2022-11-10 NOTE — Progress Notes (Signed)
Mobility Specialist - Progress Note   11/10/22 1409  Mobility  Activity Ambulated with assistance in hallway  Level of Assistance Standby assist, set-up cues, supervision of patient - no hands on  Assistive Device Front wheel walker  Distance Ambulated (ft) 200 ft  Range of Motion/Exercises Active  Activity Response Tolerated well  Mobility Referral Yes  $Mobility charge 1 Mobility  Mobility Specialist Start Time (ACUTE ONLY) 1359  Mobility Specialist Stop Time (ACUTE ONLY) 1410  Mobility Specialist Time Calculation (min) (ACUTE ONLY) 11 min   Received in chair and agreed to mobility. Had no issues throughout session. Returned to chair with all needs met and alarm on.  Marilynne Halsted Mobility Specialist

## 2022-11-10 NOTE — Anesthesia Procedure Notes (Signed)
Procedure Name: LMA Insertion Date/Time: 11/10/2022 8:40 AM  Performed by: Nathen May, CRNAPre-anesthesia Checklist: Patient identified, Emergency Drugs available, Suction available and Patient being monitored Patient Re-evaluated:Patient Re-evaluated prior to induction Oxygen Delivery Method: Circle System Utilized Preoxygenation: Pre-oxygenation with 100% oxygen Induction Type: IV induction Ventilation: Mask ventilation without difficulty LMA: LMA inserted LMA Size: 4.0, 4.5 and 5.0 Number of attempts: 1 Airway Equipment and Method: Bite block Placement Confirmation: positive ETCO2 Tube secured with: Tape Dental Injury: Teeth and Oropharynx as per pre-operative assessment

## 2022-11-10 NOTE — Discharge Instructions (Signed)

## 2022-11-10 NOTE — Op Note (Signed)
Hernia, Open, Procedure Note  Indications: The patient presented with a history of an enlarging right, reducible inguinal hernia.    Pre-operative Diagnosis: right reducible inguinal hernia Post-operative Diagnosis: same  Surgeon: Wynona Luna   Assistants: None  Anesthesia: General LMA anesthesia and TAP block  ASA Class: 3  Procedure Details  The patient was seen again in the Holding Room. The risks, benefits, complications, treatment options, and expected outcomes were discussed with the patient. The possibilities of reaction to medication, pulmonary aspiration, perforation of viscus, bleeding, recurrent infection, the need for additional procedures, and development of a complication requiring transfusion or further operation were discussed with the patient and/or family. The likelihood of success in repairing the hernia and returning the patient to their previous functional status is good.  There was concurrence with the proposed plan, and informed consent was obtained. The site of surgery was properly noted/marked. The patient was taken to the Operating Room, identified as Phillip Shelton, and the procedure verified as right inguinal hernia repair. A Time Out was held and the above information confirmed.  The patient was placed in the supine position and underwent induction of anesthesia. The lower abdomen and groin was prepped with Chloraprep and draped in the standard fashion, and 0.25% Marcaine with epinephrine was used to anesthetize the skin over the mid-portion of the inguinal canal. An oblique incision was made. Dissection was carried down through the subcutaneous tissue with cautery to the external oblique fascia.  We opened the external oblique fascia along the direction of its fibers to the external ring.  The spermatic cord was circumferentially dissected bluntly and retracted with a Penrose drain. The floor of the inguinal canal was inspected and showed no direct defect.  We  skeletonized the spermatic cord and reduced a moderate-sized indirect hernia sac and cord lipoma.  We used a right Progrip mesh which was inserted and deployed across the floor of the inguinal canal. The mesh was tucked underneath the external oblique fascia laterally.  The flap of the mesh was closed around the spermatic cord to recreate the internal inguinal ring.  The mesh was secured to the pubic tubercle with 0 Vicryl.  Additional stay sutures of 0 Vicryl were used to attach the mesh to the shelving edge inferiorly and to close the mesh flap.  The external oblique fascia was reapproximated with 2-0 Vicryl.  3-0 Vicryl was used to close the subcutaneous tissues and 4-0 Monocryl was used to close the skin in subcuticular fashion.  Benzoin and steri-strips were used to seal the incision.  A clean dressing was applied.  The patient was then extubated and brought to the recovery room in stable condition.  All sponge, instrument, and needle counts were correct prior to closure and at the conclusion of the case.   Estimated Blood Loss: Minimal                 Complications: None; patient tolerated the procedure well.         Disposition: PACU - hemodynamically stable.         Condition: stable  Phillip Shelton. Phillip Skains, MD, [facility] Surgery  General Surgery   11/10/2022 9:29 AM

## 2022-11-10 NOTE — Interval H&P Note (Signed)
History and Physical Interval Note:  11/10/2022 6:34 AM  Phillip Shelton  has presented today for surgery, with the diagnosis of right inguinal hernia.  The various methods of treatment have been discussed with the patient and family. After consideration of risks, benefits and other options for treatment, the patient has consented to  Procedure(s) with comments: RIGHT INGUINAL HERNIA REPAIR WITH MESH (Right) - LMA & TAP BLOCK as a surgical intervention.  The patient's history has been reviewed, patient examined, no change in status, stable for surgery.  I have reviewed the patient's chart and labs.  Questions were answered to the patient's satisfaction.     Wynona Luna

## 2022-11-10 NOTE — Anesthesia Procedure Notes (Signed)
Anesthesia Regional Block: TAP block   Pre-Anesthetic Checklist: , timeout performed,  Correct Patient, Correct Site, Correct Laterality,  Correct Procedure, Correct Position, site marked,  Risks and benefits discussed,  Surgical consent,  Pre-op evaluation,  At surgeon's request and post-op pain management  Laterality: Left  Prep: chloraprep       Needles:  Injection technique: Single-shot  Needle Type: Echogenic Stimulator Needle     Needle Length: 9cm  Needle Gauge: 21     Additional Needles:   Procedures:,,,, ultrasound used (permanent image in chart),,    Narrative:  Start time: 11/10/2022 8:00 AM End time: 11/10/2022 8:10 AM Injection made incrementally with aspirations every 5 mL.  Performed by: Personally  Anesthesiologist: [place] Nation, MD  Additional Notes: Discussed risks and benefits of the nerve block in detail, including but not limited vascular injury, permanent nerve damage and infection.   Patient tolerated the procedure well. Local anesthetic introduced in an incremental fashion under minimal resistance after negative aspirations. No paresthesias were elicited. After completion of the procedure, no acute issues were identified and patient continued to be monitored by RN.

## 2022-11-10 NOTE — Transfer of Care (Signed)
Immediate Anesthesia Transfer of Care Note  Patient: LENY MOROZOV  Procedure(s) Performed: RIGHT INGUINAL HERNIA REPAIR WITH MESH (Right)  Patient Location: PACU  Anesthesia Type:General  Level of Consciousness: drowsy  Airway & Oxygen Therapy: Patient Spontanous Breathing and Patient connected to nasal cannula oxygen  Post-op Assessment: Report given to RN and Post -op Vital signs reviewed and stable  Post vital signs: Reviewed and stable  Last Vitals:  Vitals Value Taken Time  BP 104/61 11/10/22 0933  Temp    Pulse 78 11/10/22 0936  Resp 14 11/10/22 0936  SpO2 100 % 11/10/22 0936  Vitals shown include unfiled device data.  Last Pain:  Vitals:   11/10/22 0656  TempSrc:   PainSc: 0-No pain         Complications: No notable events documented.

## 2022-11-10 NOTE — Anesthesia Postprocedure Evaluation (Signed)
Anesthesia Post Note  Patient: Phillip Shelton  Procedure(s) Performed: RIGHT INGUINAL HERNIA REPAIR WITH MESH (Right)     Patient location during evaluation: PACU Anesthesia Type: General Level of consciousness: awake and alert Pain management: pain level controlled Vital Signs Assessment: post-procedure vital signs reviewed and stable Respiratory status: spontaneous breathing, nonlabored ventilation, respiratory function stable and patient connected to nasal cannula oxygen Cardiovascular status: blood pressure returned to baseline and stable Postop Assessment: no apparent nausea or vomiting Anesthetic complications: no   No notable events documented.  Last Vitals:  Vitals:   11/10/22 1030 11/10/22 1050  BP: 117/67 138/70  Pulse: 83 (!) 105  Resp: 15 18  Temp:  36.6 C  SpO2: 97% 97%    Last Pain:  Vitals:   11/10/22 1050  TempSrc:   PainSc: 0-No pain                 [place] Nation

## 2022-11-11 ENCOUNTER — Encounter (HOSPITAL_COMMUNITY): Payer: Self-pay | Admitting: Surgery

## 2022-11-11 DIAGNOSIS — Z7901 Long term (current) use of anticoagulants: Secondary | ICD-10-CM | POA: Diagnosis not present

## 2022-11-11 DIAGNOSIS — Z87891 Personal history of nicotine dependence: Secondary | ICD-10-CM | POA: Diagnosis not present

## 2022-11-11 DIAGNOSIS — I4891 Unspecified atrial fibrillation: Secondary | ICD-10-CM | POA: Diagnosis not present

## 2022-11-11 DIAGNOSIS — K4090 Unilateral inguinal hernia, without obstruction or gangrene, not specified as recurrent: Secondary | ICD-10-CM | POA: Diagnosis not present

## 2022-11-11 DIAGNOSIS — Z79899 Other long term (current) drug therapy: Secondary | ICD-10-CM | POA: Diagnosis not present

## 2022-11-11 DIAGNOSIS — Z96641 Presence of right artificial hip joint: Secondary | ICD-10-CM | POA: Diagnosis not present

## 2022-11-11 MED ORDER — TRAMADOL HCL 50 MG PO TABS: 50.0000 mg | ORAL_TABLET | Freq: Four times a day (QID) | ORAL | 0 refills | Status: DC | PRN

## 2022-11-11 NOTE — Progress Notes (Signed)
Reviewed d/c instructions with pt. All questions answered. Pt awaiting transport.

## 2022-11-11 NOTE — Discharge Summary (Signed)
Physician Discharge Summary  Patient ID: Phillip Shelton MRN: [id number] DOB/AGE: 86-Nov-1936 86 y.o.  Admit date: 11/10/2022 Discharge date: 11/11/2022  Admission Diagnoses:  Right inguinal hernia  Discharge Diagnoses: Same Principal Problem:   Right inguinal hernia   Discharged Condition: good  Hospital Course: Right inguinal hernia repair with mesh 11/10/22.  The patient was kept overnight for observation due to his medical comorbidities.  Had some nausea and vomiting after surgery, but this resolved.  He has no complaints this morning and has used minimal pain meds.   Treatments: surgery: RIH repair with mesh 11/10/22  Discharge Exam: Blood pressure 99/60, pulse 80, temperature 98.3 F (36.8 C), temperature source Oral, resp. rate 18, height 6' (1.829 m), weight 85.2 kg, SpO2 94%. WDWN in nAD Right groin - no hematoma; minimal swelling   Disposition: Discharge disposition: 01-Home or Self Care       Discharge Instructions     Call MD for:  persistant nausea and vomiting   Complete by: As directed    Call MD for:  redness, tenderness, or signs of infection (pain, swelling, redness, odor or green/yellow discharge around incision site)   Complete by: As directed    Call MD for:  severe uncontrolled pain   Complete by: As directed    Call MD for:  temperature >100.4   Complete by: As directed    Diet general   Complete by: As directed    Driving Restrictions   Complete by: As directed    Do not drive while taking pain medications   Increase activity slowly   Complete by: As directed    May shower / Bathe   Complete by: As directed       Allergies as of 11/11/2022       Reactions   Atorvastatin Other (See Comments)   "Made me feel badly and I could not sleep"        Medication List     TAKE these medications    apixaban 5 MG Tabs tablet Commonly known as: ELIQUIS Take 1 tablet (5 mg total) by mouth 2 (two) times daily.   cyanocobalamin 1000 MCG  tablet Commonly known as: VITAMIN B12 Take 1,000 mcg by mouth daily.   diclofenac Sodium 1 % Gel Commonly known as: VOLTAREN Apply 4 g topically 4 (four) times daily.   finasteride 5 MG tablet Commonly known as: Proscar Take 1 tablet (5 mg total) by mouth daily.   fish oil-omega-3 fatty acids 1000 MG capsule Take 1 g by mouth at bedtime.   hydrochlorothiazide 12.5 MG capsule Commonly known as: MICROZIDE TAKE ONE CAPSULE BY MOUTH EVERY DAY FOR FLUID What changed: See the new instructions.   metoprolol tartrate 25 MG tablet Commonly known as: LOPRESSOR Take 0.5 tablets (12.5 mg total) by mouth 2 (two) times daily.   mirtazapine 7.5 MG tablet Commonly known as: REMERON Take 1 tablet (7.5 mg total) by mouth at bedtime.   MULTIVITAMIN ADULTS PO Take 1 tablet by mouth daily with breakfast.   omeprazole 20 MG capsule Commonly known as: PRILOSEC Take 1 capsule (20 mg total) by mouth daily. What changed: when to take this   tamsulosin 0.4 MG Caps capsule Commonly known as: FLOMAX Take 1 capsule (0.4 mg total) by mouth daily after supper.   temazepam 15 MG capsule Commonly known as: RESTORIL TAKE 1 CAPSULE BY MOUTH AT BEDTIME AS NEEDED FOR SLEEP. What changed:  how much to take how to take this when to take this  reasons to take this additional instructions   traMADol 50 MG tablet Commonly known as: ULTRAM Take 1 tablet (50 mg total) by mouth every 6 (six) hours as needed (mild pain).   triamcinolone cream 0.1 % Commonly known as: KENALOG Apply 1 Application topically 2 (two) times daily as needed (for itching).   Tylenol 8 Hour Arthritis Pain 650 MG CR tablet Generic drug: acetaminophen Take 650 mg by mouth See admin instructions. Take 650 mg by mouth in the morning and afternoon        Follow-up Information     Manus Rudd, MD Follow up in 3 week(s).   Specialty: General Surgery Contact information: [address] [place]  [address] [phone]                 Signed: Wynona Luna 11/11/2022, 8:35 AM

## 2022-11-11 NOTE — Progress Notes (Signed)
Order(s) created erroneously. Erroneous order ID: [id number]  Order moved by: CHART CORRECTION ANALYST SEVEN, IDENTITY  Order move date/time: 11/11/2022 12:45 PM  Source Patient: [id number]  Source Contact: 10/29/2022  Destination Patient: [id number]  Destination Contact: 06/16/2022

## 2022-12-06 ENCOUNTER — Telehealth: Payer: Self-pay | Admitting: Internal Medicine

## 2022-12-06 NOTE — Telephone Encounter (Signed)
Tabitha calling from [employer] checking up on the status of some paper work.  Policy # [id number] Best call back number is [phone]

## 2022-12-07 ENCOUNTER — Other Ambulatory Visit: Payer: Self-pay | Admitting: Internal Medicine

## 2022-12-07 ENCOUNTER — Other Ambulatory Visit: Payer: Self-pay

## 2022-12-07 NOTE — Telephone Encounter (Signed)
Not seeing anything in Pt chart of paperwork being sent over.

## 2022-12-09 DIAGNOSIS — R278 Other lack of coordination: Secondary | ICD-10-CM | POA: Diagnosis not present

## 2022-12-09 DIAGNOSIS — R2689 Other abnormalities of gait and mobility: Secondary | ICD-10-CM | POA: Diagnosis not present

## 2022-12-09 DIAGNOSIS — M6281 Muscle weakness (generalized): Secondary | ICD-10-CM | POA: Diagnosis not present

## 2022-12-16 DIAGNOSIS — M6281 Muscle weakness (generalized): Secondary | ICD-10-CM | POA: Diagnosis not present

## 2022-12-16 DIAGNOSIS — R278 Other lack of coordination: Secondary | ICD-10-CM | POA: Diagnosis not present

## 2022-12-16 DIAGNOSIS — R2689 Other abnormalities of gait and mobility: Secondary | ICD-10-CM | POA: Diagnosis not present

## 2022-12-21 DIAGNOSIS — M6281 Muscle weakness (generalized): Secondary | ICD-10-CM | POA: Diagnosis not present

## 2022-12-21 DIAGNOSIS — R278 Other lack of coordination: Secondary | ICD-10-CM | POA: Diagnosis not present

## 2022-12-23 DIAGNOSIS — R2689 Other abnormalities of gait and mobility: Secondary | ICD-10-CM | POA: Diagnosis not present

## 2022-12-23 DIAGNOSIS — M6281 Muscle weakness (generalized): Secondary | ICD-10-CM | POA: Diagnosis not present

## 2022-12-23 DIAGNOSIS — R278 Other lack of coordination: Secondary | ICD-10-CM | POA: Diagnosis not present

## 2022-12-28 DIAGNOSIS — M25469 Effusion, unspecified knee: Secondary | ICD-10-CM | POA: Diagnosis not present

## 2022-12-28 DIAGNOSIS — M170 Bilateral primary osteoarthritis of knee: Secondary | ICD-10-CM | POA: Diagnosis not present

## 2022-12-30 DIAGNOSIS — R278 Other lack of coordination: Secondary | ICD-10-CM | POA: Diagnosis not present

## 2022-12-30 DIAGNOSIS — M6281 Muscle weakness (generalized): Secondary | ICD-10-CM | POA: Diagnosis not present

## 2023-01-03 DIAGNOSIS — R278 Other lack of coordination: Secondary | ICD-10-CM | POA: Diagnosis not present

## 2023-01-03 DIAGNOSIS — M19011 Primary osteoarthritis, right shoulder: Secondary | ICD-10-CM | POA: Diagnosis not present

## 2023-01-03 DIAGNOSIS — M19012 Primary osteoarthritis, left shoulder: Secondary | ICD-10-CM | POA: Diagnosis not present

## 2023-01-03 DIAGNOSIS — M6281 Muscle weakness (generalized): Secondary | ICD-10-CM | POA: Diagnosis not present

## 2023-01-06 DIAGNOSIS — M6281 Muscle weakness (generalized): Secondary | ICD-10-CM | POA: Diagnosis not present

## 2023-01-06 DIAGNOSIS — R2689 Other abnormalities of gait and mobility: Secondary | ICD-10-CM | POA: Diagnosis not present

## 2023-01-06 DIAGNOSIS — R278 Other lack of coordination: Secondary | ICD-10-CM | POA: Diagnosis not present

## 2023-01-11 DIAGNOSIS — R278 Other lack of coordination: Secondary | ICD-10-CM | POA: Diagnosis not present

## 2023-01-11 DIAGNOSIS — M6281 Muscle weakness (generalized): Secondary | ICD-10-CM | POA: Diagnosis not present

## 2023-01-13 DIAGNOSIS — M6281 Muscle weakness (generalized): Secondary | ICD-10-CM | POA: Diagnosis not present

## 2023-01-13 DIAGNOSIS — R278 Other lack of coordination: Secondary | ICD-10-CM | POA: Diagnosis not present

## 2023-01-13 DIAGNOSIS — R2689 Other abnormalities of gait and mobility: Secondary | ICD-10-CM | POA: Diagnosis not present

## 2023-01-19 DIAGNOSIS — M6281 Muscle weakness (generalized): Secondary | ICD-10-CM | POA: Diagnosis not present

## 2023-01-19 DIAGNOSIS — R278 Other lack of coordination: Secondary | ICD-10-CM | POA: Diagnosis not present

## 2023-01-20 DIAGNOSIS — R278 Other lack of coordination: Secondary | ICD-10-CM | POA: Diagnosis not present

## 2023-01-20 DIAGNOSIS — M6281 Muscle weakness (generalized): Secondary | ICD-10-CM | POA: Diagnosis not present

## 2023-01-21 ENCOUNTER — Ambulatory Visit: Payer: Medicare Other | Admitting: Internal Medicine

## 2023-01-24 DIAGNOSIS — M6281 Muscle weakness (generalized): Secondary | ICD-10-CM | POA: Diagnosis not present

## 2023-01-24 DIAGNOSIS — R278 Other lack of coordination: Secondary | ICD-10-CM | POA: Diagnosis not present

## 2023-01-25 DIAGNOSIS — R2689 Other abnormalities of gait and mobility: Secondary | ICD-10-CM | POA: Diagnosis not present

## 2023-01-25 DIAGNOSIS — R278 Other lack of coordination: Secondary | ICD-10-CM | POA: Diagnosis not present

## 2023-01-25 DIAGNOSIS — M6281 Muscle weakness (generalized): Secondary | ICD-10-CM | POA: Diagnosis not present

## 2023-01-26 DIAGNOSIS — M6281 Muscle weakness (generalized): Secondary | ICD-10-CM | POA: Diagnosis not present

## 2023-01-26 DIAGNOSIS — R278 Other lack of coordination: Secondary | ICD-10-CM | POA: Diagnosis not present

## 2023-01-27 DIAGNOSIS — D1801 Hemangioma of skin and subcutaneous tissue: Secondary | ICD-10-CM | POA: Diagnosis not present

## 2023-01-27 DIAGNOSIS — Z85828 Personal history of other malignant neoplasm of skin: Secondary | ICD-10-CM | POA: Diagnosis not present

## 2023-01-27 DIAGNOSIS — D044 Carcinoma in situ of skin of scalp and neck: Secondary | ICD-10-CM | POA: Diagnosis not present

## 2023-01-27 DIAGNOSIS — R2689 Other abnormalities of gait and mobility: Secondary | ICD-10-CM | POA: Diagnosis not present

## 2023-01-27 DIAGNOSIS — L570 Actinic keratosis: Secondary | ICD-10-CM | POA: Diagnosis not present

## 2023-01-27 DIAGNOSIS — L821 Other seborrheic keratosis: Secondary | ICD-10-CM | POA: Diagnosis not present

## 2023-01-27 DIAGNOSIS — R278 Other lack of coordination: Secondary | ICD-10-CM | POA: Diagnosis not present

## 2023-01-27 DIAGNOSIS — M6281 Muscle weakness (generalized): Secondary | ICD-10-CM | POA: Diagnosis not present

## 2023-01-28 ENCOUNTER — Encounter: Payer: Self-pay | Admitting: Internal Medicine

## 2023-01-28 ENCOUNTER — Ambulatory Visit: Payer: Medicare Other | Admitting: Internal Medicine

## 2023-01-28 VITALS — BP 110/64 | HR 80 | Temp 98.0°F | Ht 72.0 in | Wt 185.0 lb

## 2023-01-28 DIAGNOSIS — Z0001 Encounter for general adult medical examination with abnormal findings: Secondary | ICD-10-CM | POA: Diagnosis not present

## 2023-01-28 DIAGNOSIS — Z7901 Long term (current) use of anticoagulants: Secondary | ICD-10-CM | POA: Diagnosis not present

## 2023-01-28 DIAGNOSIS — F5101 Primary insomnia: Secondary | ICD-10-CM | POA: Diagnosis not present

## 2023-01-28 DIAGNOSIS — R739 Hyperglycemia, unspecified: Secondary | ICD-10-CM

## 2023-01-28 DIAGNOSIS — F321 Major depressive disorder, single episode, moderate: Secondary | ICD-10-CM

## 2023-01-28 DIAGNOSIS — E7800 Pure hypercholesterolemia, unspecified: Secondary | ICD-10-CM

## 2023-01-28 DIAGNOSIS — E559 Vitamin D deficiency, unspecified: Secondary | ICD-10-CM

## 2023-01-28 LAB — LIPID PANEL
Cholesterol: 193 mg/dL (ref 0–200)
HDL: 59.8 mg/dL (ref >39.00)
LDL Cholesterol: 111 mg/dL — ABNORMAL HIGH (ref 0–99)
NonHDL: 133.58
Total CHOL/HDL Ratio: 3
Triglycerides: 115 mg/dL (ref 0.0–149.0)
VLDL: 23 mg/dL (ref 0.0–40.0)

## 2023-01-28 LAB — HEPATIC FUNCTION PANEL
ALT: 9 U/L (ref 0–53)
AST: 15 U/L (ref 0–37)
Albumin: 4.4 g/dL (ref 3.5–5.2)
Alkaline Phosphatase: 70 U/L (ref 39–117)
Bilirubin, Direct: 0.2 mg/dL (ref 0.0–0.3)
Total Bilirubin: 0.6 mg/dL (ref 0.2–1.2)
Total Protein: 7.1 g/dL (ref 6.0–8.3)

## 2023-01-28 LAB — CBC WITH DIFFERENTIAL/PLATELET
Basophils Absolute: 0 10*3/uL (ref 0.0–0.1)
Basophils Relative: 0.8 % (ref 0.0–3.0)
Eosinophils Absolute: 0.3 10*3/uL (ref 0.0–0.7)
Eosinophils Relative: 5.6 % — ABNORMAL HIGH (ref 0.0–5.0)
HCT: 38.9 % — ABNORMAL LOW (ref 39.0–52.0)
Hemoglobin: 13 g/dL (ref 13.0–17.0)
Lymphocytes Relative: 25.2 % (ref 12.0–46.0)
Lymphs Abs: 1.5 10*3/uL (ref 0.7–4.0)
MCHC: 33.5 g/dL (ref 30.0–36.0)
MCV: 94.2 fL (ref 78.0–100.0)
Monocytes Absolute: 1.1 10*3/uL — ABNORMAL HIGH (ref 0.1–1.0)
Monocytes Relative: 17.6 % — ABNORMAL HIGH (ref 3.0–12.0)
Neutro Abs: 3.1 10*3/uL (ref 1.4–7.7)
Neutrophils Relative %: 50.8 % (ref 43.0–77.0)
Platelets: 235 10*3/uL (ref 150.0–400.0)
RBC: 4.13 Mil/uL — ABNORMAL LOW (ref 4.22–5.81)
RDW: 14.1 % (ref 11.5–15.5)
WBC: 6 10*3/uL (ref 4.0–10.5)

## 2023-01-28 LAB — BASIC METABOLIC PANEL
BUN: 21 mg/dL (ref 6–23)
CO2: 27 meq/L (ref 19–32)
Calcium: 9.4 mg/dL (ref 8.4–10.5)
Chloride: 102 meq/L (ref 96–112)
Creatinine, Ser: 0.92 mg/dL (ref 0.40–1.50)
GFR: 75.54 mL/min (ref >60.00)
Glucose, Bld: 98 mg/dL (ref 70–99)
Potassium: 4.3 meq/L (ref 3.5–5.1)
Sodium: 137 meq/L (ref 135–145)

## 2023-01-28 LAB — TSH: TSH: 1.44 u[IU]/mL (ref 0.35–5.50)

## 2023-01-28 LAB — HEMOGLOBIN A1C: Hgb A1c MFr Bld: 5.9 % (ref 4.6–6.5)

## 2023-01-28 MED ORDER — MIRTAZAPINE 7.5 MG PO TABS: 7.5000 mg | ORAL_TABLET | Freq: Every day | ORAL | 3 refills | Status: DC

## 2023-01-28 NOTE — Progress Notes (Signed)
The test results show that your current treatment is OK, as the tests are stable.  Please continue the same plan.  There is no other need for change of treatment or further evaluation based on these results, at this time.  thanks

## 2023-01-28 NOTE — Patient Instructions (Addendum)
Ok to restart the eliquis  Please continue all other medications as before, and refills have been done if requested for the mirtazipine as well for sleep and mood  Please have the pharmacy call with any other refills you may need.  Please continue your efforts at being more active, low cholesterol diet, and weight control.  You are otherwise up to date with prevention measures today.  Please keep your appointments with your specialists as you may have planned  Please go to the LAB at the blood drawing area for the tests to be done  You will be contacted by phone if any changes need to be made immediately.  Otherwise, you will receive a letter about your results with an explanation, but please check with MyChart first.  Please make an Appointment to return in 6 months, or sooner if needed

## 2023-01-28 NOTE — Progress Notes (Unsigned)
Patient ID: Phillip Shelton, male   DOB: 04-14-1934, 89 y.o.   MRN: [id number]         Chief Complaint:: wellness exam and chronic anticoagulation, insomnia with low mood, low vit d, hld       HPI:  Phillip Shelton is a 89 y.o. male here for wellness exam; for shingrix at pharmacy, declines covid booster, o/w up to datte                         Also s/p recent hernia repair without complication.  Wanting to know if should restart the eliquis. Pt denies chest pain, increased sob or doe, wheezing, orthopnea, PND, increased LE swelling, palpitations, dizziness or syncope.   Pt denies polydipsia, polyuria, or new focal neuro s/s.    Pt denies fever, wt loss, night sweats, loss of appetite, or other constitutional symptoms  does have worsening insomnia and low mood in the past month, for some reason has gotten away from the remeron, asking to restart.       Wt Readings from Last 3 Encounters:  01/28/23 185 lb (83.9 kg)  11/10/22 187 lb 13.3 oz (85.2 kg)  11/04/22 187 lb 13.3 oz (85.2 kg)   BP Readings from Last 3 Encounters:  01/28/23 110/64  11/11/22 (!) 106/57  11/04/22 (!) 116/57   Immunization History  Administered Date(s) Administered   Fluad Quad(high Dose 65+) 10/04/2018, 10/02/2019, 10/01/2020, 09/15/2021   Fluad Trivalent(High Dose 65+) 09/20/2022   Influenza, High Dose Seasonal PF 10/21/2015, 10/11/2016, 10/21/2017   Influenza,inj,Quad PF,6+ Mos 10/13/2012, 10/29/2013, 11/20/2014   Influenza,inj,quad, With Preservative 10/05/2018   Influenza-Unspecified 11/04/2020   PFIZER(Purple Top)SARS-COV-2 Vaccination 02/10/2019, 03/07/2019, 10/20/2019   Pfizer Covid-19 Vaccine Bivalent Booster 54yrs & up 10/23/2020   Pneumococcal Conjugate-13 12/26/2013   Pneumococcal Polysaccharide-23 12/03/2004, 04/20/2016   Tdap 03/27/2014   Zoster, Live 07/28/2010   Health Maintenance Due  Topic Date Due   Zoster Vaccines- Shingrix (1 of 2) 12/24/1986   COVID-19 Vaccine (5 - 2024-25 season)  09/05/2022   Medicare Annual Wellness (AWV)  10/03/2022      Past Medical History:  Diagnosis Date   Arthritis    Atrial fibrillation (HCC)    Blood in stool 02/12/2010   Qualifier: Diagnosis of  By: Misty Stanley CMA (AAMA), Amanda     BPH (benign prostatic hyperplasia) 01/18/2020   Complication of anesthesia    Diverticulosis    Esophageal stricture    GERD 02/12/2010   Qualifier: Diagnosis of  By: Misty Stanley CMA (AAMA), Amanda     GERD (gastroesophageal reflux disease)    Hemorrhoids    Hiatal hernia    Idiopathic Charcot's joint of foot 12/27/2012   Mallory - Weiss tear 1981   OAB (overactive bladder) 12/27/2012   Osteoarthritis of right hip 09/19/2014   PONV (postoperative nausea and vomiting)    Pure hypercholesterolemia 12/27/2012   Past Surgical History:  Procedure Laterality Date   CHOLECYSTECTOMY     COLONOSCOPY     GASTROCNEMIUS RECESSION  02/03/2011   Procedure: GASTROCNEMIUS SLIDE;  Surgeon: Sherri Rad, MD;  Location: [facility];  Service: Orthopedics;  Laterality: Right;   HAND SURGERY  jan 2016   rt hand   HEMORRHOID SURGERY  1990   INGUINAL HERNIA REPAIR Right 11/10/2022   Procedure: RIGHT INGUINAL HERNIA REPAIR WITH MESH;  Surgeon: Manus Rudd, MD;  Location: [facility];  Service: General;  Laterality: Right;  LMA & TAP BLOCK  TOTAL HIP ARTHROPLASTY Right 09/19/2014   Procedure: RIGHT TOTAL HIP ARTHROPLASTY ANTERIOR APPROACH;  Surgeon: Samson Frederic, MD;  Location: [facility];  Service: Orthopedics;  Laterality: Right;   UMBILICAL HERNIA REPAIR     UPPER GASTROINTESTINAL ENDOSCOPY      reports that he quit smoking about 43 years ago. His smoking use included cigarettes. He has never used smokeless tobacco. He reports that he does not currently use alcohol. He reports that he does not use drugs. family history includes Arthritis in his father; Colon cancer in his maternal uncle; Hypertension in his father. Allergies  Allergen Reactions    Atorvastatin Other (See Comments)    "Made me feel badly and I could not sleep"   Current Outpatient Medications on File Prior to Visit  Medication Sig Dispense Refill   apixaban (ELIQUIS) 5 MG TABS tablet Take 1 tablet (5 mg total) by mouth 2 (two) times daily. 60 tablet 0   cyanocobalamin (VITAMIN B12) 1000 MCG tablet Take 1,000 mcg by mouth daily.     diclofenac Sodium (VOLTAREN) 1 % GEL Apply 4 g topically 4 (four) times daily. 100 g 11   finasteride (PROSCAR) 5 MG tablet Take 1 tablet (5 mg total) by mouth daily. 90 tablet 3   fish oil-omega-3 fatty acids 1000 MG capsule Take 1 g by mouth at bedtime.      hydrochlorothiazide (MICROZIDE) 12.5 MG capsule TAKE ONE CAPSULE BY MOUTH EVERY DAY FOR FLUID 30 capsule 0   Multiple Vitamins-Minerals (MULTIVITAMIN ADULTS PO) Take 1 tablet by mouth daily with breakfast.     omeprazole (PRILOSEC) 20 MG capsule Take 1 capsule (20 mg total) by mouth daily. (Patient taking differently: Take 20 mg by mouth daily before breakfast.) 90 capsule 2   tamsulosin (FLOMAX) 0.4 MG CAPS capsule Take 1 capsule (0.4 mg total) by mouth daily after supper. 30 capsule 0   temazepam (RESTORIL) 15 MG capsule TAKE 1 CAPSULE BY MOUTH AT BEDTIME AS NEEDED FOR SLEEP. (Patient taking differently: Take 15 mg by mouth at bedtime as needed for sleep.) 30 capsule 2   traMADol (ULTRAM) 50 MG tablet Take 1 tablet (50 mg total) by mouth every 6 (six) hours as needed (mild pain). 20 tablet 0   triamcinolone cream (KENALOG) 0.1 % Apply 1 Application topically 2 (two) times daily as needed (for itching).     TYLENOL 8 HOUR ARTHRITIS PAIN 650 MG CR tablet Take 650 mg by mouth See admin instructions. Take 650 mg by mouth in the morning and afternoon     metoprolol tartrate (LOPRESSOR) 25 MG tablet Take 0.5 tablets (12.5 mg total) by mouth 2 (two) times daily. 60 tablet 0   No current facility-administered medications on file prior to visit.        ROS:  All others reviewed and  negative.  Objective        PE:  BP 110/64 (BP Location: Right Arm, Patient Position: Sitting, Cuff Size: Normal)   Pulse 80   Temp 98 F (36.7 C) (Oral)   Ht 6' (1.829 m)   Wt 185 lb (83.9 kg)   SpO2 98%   BMI 25.09 kg/m                 Constitutional: Pt appears in NAD               HENT: Head: NCAT.                Right Ear: External ear normal.  Left Ear: External ear normal.                Eyes: . Pupils are equal, round, and reactive to light. Conjunctivae and EOM are normal               Nose: without d/c or deformity               Neck: Neck supple. Gross normal ROM               Cardiovascular: Normal rate and regular rhythm.                 Pulmonary/Chest: Effort normal and breath sounds without rales or wheezing.                Abd:  Soft, NT, ND, + BS, no organomegaly               Neurological: Pt is alert. At baseline orientation, motor grossly intact               Skin: Skin is warm. No rashes, no other new lesions, LE edema - none               Psychiatric: Pt behavior is normal without agitation , depressed affect  Micro: none  Cardiac tracings I have personally interpreted today:  none  Pertinent Radiological findings (summarize): none   Lab Results  Component Value Date   WBC 6.0 01/28/2023   HGB 13.0 01/28/2023   HCT 38.9 (L) 01/28/2023   PLT 235.0 01/28/2023   GLUCOSE 98 01/28/2023   CHOL 193 01/28/2023   TRIG 115.0 01/28/2023   HDL 59.80 01/28/2023   LDLDIRECT 133.0 12/22/2012   LDLCALC 111 (H) 01/28/2023   ALT 9 01/28/2023   AST 15 01/28/2023   NA 137 01/28/2023   K 4.3 01/28/2023   CL 102 01/28/2023   CREATININE 0.92 01/28/2023   BUN 21 01/28/2023   CO2 27 01/28/2023   TSH 1.44 01/28/2023   INR 1.1 08/06/2022   HGBA1C 5.9 01/28/2023   MICROALBUR 3.3 (H) 12/26/2013   Assessment/Plan:  ALBEIRO TROMPETER is a 89 y.o. White or Caucasian [1] male with  has a past medical history of Arthritis, Atrial fibrillation (HCC), Blood  in stool (02/12/2010), BPH (benign prostatic hyperplasia) (01/18/2020), Complication of anesthesia, Diverticulosis, Esophageal stricture, GERD (02/12/2010), GERD (gastroesophageal reflux disease), Hemorrhoids, Hiatal hernia, Idiopathic Charcot's joint of foot (12/27/2012), Mallory - Weiss tear (1981), OAB (overactive bladder) (12/27/2012), Osteoarthritis of right hip (09/19/2014), PONV (postoperative nausea and vomiting), and Pure hypercholesterolemia (12/27/2012).  Encounter for well adult exam with abnormal findings Age and sex appropriate education and counseling updated with regular exercise and diet Referrals for preventative services - none needed Immunizations addressed - for shignrix at pharmacy, declines covid booster Smoking counseling  - none needed Evidence for depression or other mood disorder - none significant Most recent labs reviewed. I have personally reviewed and have noted: 1) the patient's medical and social history 2) The patient's current medications and supplements 3) The patient's height, weight, and BMI have been recorded in the chart   Depression, major, single episode, moderate (HCC) Wth recent mild worsening, for remeron 7.5 mg qhs  Insomnia Mild to mod, for restoril at bedtime prn  to f/u any worsening symptoms or concerns  Pure hypercholesterolemia Lab Results  Component Value Date   LDLCALC 111 (H) 01/28/2023   Uncontrolled, has hx of statin intolerance, for lower chol diet, declines repatha or  zetia for now   Vitamin D deficiency Last vitamin D Lab Results  Component Value Date   VD25OH 37.50 01/20/2022  Low, to start oral replacement   Chronic anticoagulation pt encouraged to restart eliquis  Followup: Return in about 6 months (around 07/28/2023).  Oliver Barre, MD 02/01/2023 8:59 PM [facility]

## 2023-01-31 DIAGNOSIS — R278 Other lack of coordination: Secondary | ICD-10-CM | POA: Diagnosis not present

## 2023-01-31 DIAGNOSIS — M6281 Muscle weakness (generalized): Secondary | ICD-10-CM | POA: Diagnosis not present

## 2023-02-01 ENCOUNTER — Encounter: Payer: Self-pay | Admitting: Internal Medicine

## 2023-02-01 DIAGNOSIS — Z7901 Long term (current) use of anticoagulants: Secondary | ICD-10-CM | POA: Insufficient documentation

## 2023-02-01 NOTE — Assessment & Plan Note (Signed)
Last vitamin D Lab Results  Component Value Date   VD25OH 37.50 01/20/2022   Low, to start oral replacement

## 2023-02-01 NOTE — Assessment & Plan Note (Signed)
pt encouraged to restart eliquis

## 2023-02-01 NOTE — Assessment & Plan Note (Signed)
Lab Results  Component Value Date   LDLCALC 111 (H) 01/28/2023   Uncontrolled, has hx of statin intolerance, for lower chol diet, declines repatha or zetia for now

## 2023-02-01 NOTE — Assessment & Plan Note (Signed)
Wth recent mild worsening, for remeron 7.5 mg qhs

## 2023-02-01 NOTE — Assessment & Plan Note (Signed)
Mild to mod, for restoril at bedtime prn  to f/u any worsening symptoms or concerns

## 2023-02-01 NOTE — Assessment & Plan Note (Signed)
Age and sex appropriate education and counseling updated with regular exercise and diet Referrals for preventative services - none needed Immunizations addressed - for shignrix at pharmacy, declines covid booster Smoking counseling  - none needed Evidence for depression or other mood disorder - none significant Most recent labs reviewed. I have personally reviewed and have noted: 1) the patient's medical and social history 2) The patient's current medications and supplements 3) The patient's height, weight, and BMI have been recorded in the chart

## 2023-02-02 DIAGNOSIS — R278 Other lack of coordination: Secondary | ICD-10-CM | POA: Diagnosis not present

## 2023-02-02 DIAGNOSIS — R2689 Other abnormalities of gait and mobility: Secondary | ICD-10-CM | POA: Diagnosis not present

## 2023-02-02 DIAGNOSIS — M6281 Muscle weakness (generalized): Secondary | ICD-10-CM | POA: Diagnosis not present

## 2023-02-07 DIAGNOSIS — R278 Other lack of coordination: Secondary | ICD-10-CM | POA: Diagnosis not present

## 2023-02-07 DIAGNOSIS — M6281 Muscle weakness (generalized): Secondary | ICD-10-CM | POA: Diagnosis not present

## 2023-02-09 DIAGNOSIS — H2513 Age-related nuclear cataract, bilateral: Secondary | ICD-10-CM | POA: Diagnosis not present

## 2023-02-09 DIAGNOSIS — H524 Presbyopia: Secondary | ICD-10-CM | POA: Diagnosis not present

## 2023-02-09 DIAGNOSIS — H5202 Hypermetropia, left eye: Secondary | ICD-10-CM | POA: Diagnosis not present

## 2023-02-09 DIAGNOSIS — H53002 Unspecified amblyopia, left eye: Secondary | ICD-10-CM | POA: Diagnosis not present

## 2023-02-10 DIAGNOSIS — M6281 Muscle weakness (generalized): Secondary | ICD-10-CM | POA: Diagnosis not present

## 2023-02-10 DIAGNOSIS — R278 Other lack of coordination: Secondary | ICD-10-CM | POA: Diagnosis not present

## 2023-02-14 ENCOUNTER — Other Ambulatory Visit: Payer: Self-pay | Admitting: Internal Medicine

## 2023-02-14 ENCOUNTER — Other Ambulatory Visit: Payer: Self-pay

## 2023-02-14 DIAGNOSIS — R278 Other lack of coordination: Secondary | ICD-10-CM | POA: Diagnosis not present

## 2023-02-14 DIAGNOSIS — M6281 Muscle weakness (generalized): Secondary | ICD-10-CM | POA: Diagnosis not present

## 2023-02-16 DIAGNOSIS — R278 Other lack of coordination: Secondary | ICD-10-CM | POA: Diagnosis not present

## 2023-02-16 DIAGNOSIS — M6281 Muscle weakness (generalized): Secondary | ICD-10-CM | POA: Diagnosis not present

## 2023-02-18 DIAGNOSIS — M6281 Muscle weakness (generalized): Secondary | ICD-10-CM | POA: Diagnosis not present

## 2023-02-18 DIAGNOSIS — R278 Other lack of coordination: Secondary | ICD-10-CM | POA: Diagnosis not present

## 2023-02-18 DIAGNOSIS — R2689 Other abnormalities of gait and mobility: Secondary | ICD-10-CM | POA: Diagnosis not present

## 2023-02-21 DIAGNOSIS — M6281 Muscle weakness (generalized): Secondary | ICD-10-CM | POA: Diagnosis not present

## 2023-02-21 DIAGNOSIS — R278 Other lack of coordination: Secondary | ICD-10-CM | POA: Diagnosis not present

## 2023-02-23 DIAGNOSIS — M6281 Muscle weakness (generalized): Secondary | ICD-10-CM | POA: Diagnosis not present

## 2023-02-23 DIAGNOSIS — R278 Other lack of coordination: Secondary | ICD-10-CM | POA: Diagnosis not present

## 2023-02-23 DIAGNOSIS — R2689 Other abnormalities of gait and mobility: Secondary | ICD-10-CM | POA: Diagnosis not present

## 2023-02-28 DIAGNOSIS — M6281 Muscle weakness (generalized): Secondary | ICD-10-CM | POA: Diagnosis not present

## 2023-02-28 DIAGNOSIS — R278 Other lack of coordination: Secondary | ICD-10-CM | POA: Diagnosis not present

## 2023-03-03 DIAGNOSIS — M6281 Muscle weakness (generalized): Secondary | ICD-10-CM | POA: Diagnosis not present

## 2023-03-03 DIAGNOSIS — R2689 Other abnormalities of gait and mobility: Secondary | ICD-10-CM | POA: Diagnosis not present

## 2023-03-03 DIAGNOSIS — R278 Other lack of coordination: Secondary | ICD-10-CM | POA: Diagnosis not present

## 2023-03-04 DIAGNOSIS — R278 Other lack of coordination: Secondary | ICD-10-CM | POA: Diagnosis not present

## 2023-03-04 DIAGNOSIS — M6281 Muscle weakness (generalized): Secondary | ICD-10-CM | POA: Diagnosis not present

## 2023-03-07 ENCOUNTER — Emergency Department (HOSPITAL_COMMUNITY)
Admission: EM | Admit: 2023-03-07 | Discharge: 2023-03-07 | Disposition: A | Discharge status: Home / Self Care | Attending: Emergency Medicine | Admitting: Emergency Medicine

## 2023-03-07 ENCOUNTER — Other Ambulatory Visit: Payer: Self-pay

## 2023-03-07 DIAGNOSIS — E876 Hypokalemia: Secondary | ICD-10-CM

## 2023-03-07 DIAGNOSIS — I4891 Unspecified atrial fibrillation: Secondary | ICD-10-CM | POA: Insufficient documentation

## 2023-03-07 DIAGNOSIS — R197 Diarrhea, unspecified: Secondary | ICD-10-CM | POA: Diagnosis not present

## 2023-03-07 DIAGNOSIS — Z743 Need for continuous supervision: Secondary | ICD-10-CM | POA: Diagnosis not present

## 2023-03-07 DIAGNOSIS — Z7901 Long term (current) use of anticoagulants: Secondary | ICD-10-CM | POA: Diagnosis not present

## 2023-03-07 DIAGNOSIS — R110 Nausea: Secondary | ICD-10-CM | POA: Diagnosis not present

## 2023-03-07 DIAGNOSIS — N179 Acute kidney failure, unspecified: Secondary | ICD-10-CM

## 2023-03-07 DIAGNOSIS — R1084 Generalized abdominal pain: Secondary | ICD-10-CM | POA: Diagnosis not present

## 2023-03-07 DIAGNOSIS — I499 Cardiac arrhythmia, unspecified: Secondary | ICD-10-CM | POA: Diagnosis not present

## 2023-03-07 DIAGNOSIS — R1111 Vomiting without nausea: Secondary | ICD-10-CM | POA: Diagnosis not present

## 2023-03-07 LAB — CBC WITH DIFFERENTIAL/PLATELET
Abs Immature Granulocytes: 0.05 10*3/uL (ref 0.00–0.07)
Basophils Absolute: 0 10*3/uL (ref 0.0–0.1)
Basophils Relative: 0 %
Eosinophils Absolute: 0 10*3/uL (ref 0.0–0.5)
Eosinophils Relative: 0 %
HCT: 40.8 % (ref 39.0–52.0)
Hemoglobin: 13.4 g/dL (ref 13.0–17.0)
Immature Granulocytes: 1 %
Lymphocytes Relative: 8 %
Lymphs Abs: 0.7 10*3/uL (ref 0.7–4.0)
MCH: 31 pg (ref 26.0–34.0)
MCHC: 32.8 g/dL (ref 30.0–36.0)
MCV: 94.4 fL (ref 80.0–100.0)
Monocytes Absolute: 1.1 10*3/uL — ABNORMAL HIGH (ref 0.1–1.0)
Monocytes Relative: 12 %
Neutro Abs: 6.9 10*3/uL (ref 1.7–7.7)
Neutrophils Relative %: 79 %
Platelets: 214 10*3/uL (ref 150–400)
RBC: 4.32 MIL/uL (ref 4.22–5.81)
RDW: 13.5 % (ref 11.5–15.5)
WBC: 8.7 10*3/uL (ref 4.0–10.5)
nRBC: 0 % (ref 0.0–0.2)

## 2023-03-07 LAB — COMPREHENSIVE METABOLIC PANEL
ALT: 15 U/L (ref 0–44)
AST: 27 U/L (ref 15–41)
Albumin: 3.9 g/dL (ref 3.5–5.0)
Alkaline Phosphatase: 56 U/L (ref 38–126)
Anion gap: 11 (ref 5–15)
BUN: 43 mg/dL — ABNORMAL HIGH (ref 8–23)
CO2: 21 mmol/L — ABNORMAL LOW (ref 22–32)
Calcium: 8.7 mg/dL — ABNORMAL LOW (ref 8.9–10.3)
Chloride: 101 mmol/L (ref 98–111)
Creatinine, Ser: 1.29 mg/dL — ABNORMAL HIGH (ref 0.61–1.24)
GFR, Estimated: 54 mL/min — ABNORMAL LOW (ref >60)
Glucose, Bld: 162 mg/dL — ABNORMAL HIGH (ref 70–99)
Potassium: 2.8 mmol/L — ABNORMAL LOW (ref 3.5–5.1)
Sodium: 133 mmol/L — ABNORMAL LOW (ref 135–145)
Total Bilirubin: 0.9 mg/dL (ref 0.0–1.2)
Total Protein: 7.1 g/dL (ref 6.5–8.1)

## 2023-03-07 LAB — C DIFFICILE QUICK SCREEN W PCR REFLEX
C Diff antigen: NEGATIVE
C Diff interpretation: NOT DETECTED
C Diff toxin: NEGATIVE

## 2023-03-07 LAB — LIPASE, BLOOD: Lipase: 22 U/L (ref 11–51)

## 2023-03-07 MED ORDER — POTASSIUM CHLORIDE CRYS ER 20 MEQ PO TBCR
40.0000 meq | EXTENDED_RELEASE_TABLET | Freq: Once | ORAL | Status: AC
  Administered 2023-03-07: 40 meq via ORAL
  Filled 2023-03-07: qty 2

## 2023-03-07 MED ORDER — FAMOTIDINE 20 MG PO TABS
20.0000 mg | ORAL_TABLET | Freq: Once | ORAL | Status: AC
  Administered 2023-03-07: 20 mg via ORAL
  Filled 2023-03-07: qty 1

## 2023-03-07 MED ORDER — LACTATED RINGERS IV BOLUS
1000.0000 mL | Freq: Once | INTRAVENOUS | Status: AC
  Administered 2023-03-07: 1000 mL via INTRAVENOUS

## 2023-03-07 MED ORDER — POTASSIUM CHLORIDE CRYS ER 20 MEQ PO TBCR
20.0000 meq | EXTENDED_RELEASE_TABLET | Freq: Every day | ORAL | 0 refills | Status: DC
Start: 2023-03-07 — End: 2023-03-11

## 2023-03-07 MED ORDER — LOPERAMIDE HCL 2 MG PO CAPS: 2.0000 mg | ORAL_CAPSULE | Freq: Four times a day (QID) | ORAL | 0 refills | Status: DC | PRN

## 2023-03-07 NOTE — Discharge Instructions (Signed)
 You were seen in the emergency department for your diarrhea.  Your workup did show some mild dehydration and your kidney function was slightly increased as well as some low potassium from the diarrhea.  We did give you fluids and extra potassium in the ER and I have given you a potassium supplement that you should take for the next few days.  You can take Imodium as needed for your diarrhea and make sure that you are drinking plenty of fluids.  Your C. difficile stool study here was negative.  We did send off an additional stool study and if any of these come back positive you will receive a call.  You should follow-up with your primary doctor in the next few days to have your symptoms and labs rechecked.  You should return to the emergency department if you are having continuous diarrhea and are becoming more dehydrated, you are too weak to walk, you pass out or if you have any other new or concerning symptoms.

## 2023-03-07 NOTE — ED Provider Notes (Signed)
 [facility] AT [facility] Provider Note   CSN: [id number] Arrival date & time: 03/07/23  1507     History  Chief Complaint  Patient presents with   Diarrhea    Phillip Shelton is a 89 y.o. male.  Patient is an 89 year old male with a past medical history of A-fib on Eliquis and GERD presenting to the emergency department with diarrhea.  Patient reports he has had a diarrheal illness for the last 3 days and feels like he is getting dehydrated.  He states that he feels weak all over.  He normally walks with a walker and has had trouble getting up due to the weakness.  He states he did have nausea and vomiting yesterday that has since resolved and has been able to eat today.  He states that he does have diarrhea multiple times an hour and that his stools appear dark.  He denies any obvious blood in his stool or vomit.  He denies any fever.  States that he is generalized stomach cramping but no significant pain.  He denies any known sick contacts.  He denies any recent hospitalizations or antibiotic use.  The history is provided by the patient.  Diarrhea      Home Medications Prior to Admission medications   Medication Sig Start Date End Date Taking? Authorizing Provider  acetaminophen (TYLENOL) 500 MG tablet Take 1,000 mg by mouth 3 (three) times daily.   Yes [provider]  apixaban (ELIQUIS) 5 MG TABS tablet Take 1 tablet (5 mg total) by mouth 2 (two) times daily. 08/14/22  Yes Jerald Kief, MD  cyanocobalamin (VITAMIN B12) 1000 MCG tablet Take 1,000 mcg by mouth daily.   Yes [provider]  diclofenac Sodium (VOLTAREN) 1 % GEL Apply 4 g topically 4 (four) times daily. Patient taking differently: Apply 4 g topically See admin instructions. Apply 4 grams topically four times a day to painful areas on BOTH legs 09/13/22  Yes Corwin Levins, MD  finasteride (PROSCAR) 5 MG tablet Take 1 tablet (5 mg total) by mouth daily. Patient taking  differently: Take 5 mg by mouth at bedtime. 12/17/21  Yes Corwin Levins, MD  fish oil-omega-3 fatty acids 1000 MG capsule Take 1 g by mouth at bedtime.    Yes [provider]  hydrochlorothiazide (MICROZIDE) 12.5 MG capsule TAKE ONE CAPSULE BY MOUTH EVERY DAY FOR FLUID Patient taking differently: Take 12.5 mg by mouth in the morning. 02/14/23  Yes Corwin Levins, MD  loperamide (IMODIUM) 2 MG capsule Take 1 capsule (2 mg total) by mouth 4 (four) times daily as needed for diarrhea or loose stools. 03/07/23  Yes Theresia Lo, Benetta Spar K, DO  metoprolol tartrate (LOPRESSOR) 25 MG tablet Take 0.5 tablets (12.5 mg total) by mouth 2 (two) times daily. 08/14/22 03/07/23 Yes Jerald Kief, MD  mirtazapine (REMERON) 7.5 MG tablet Take 1 tablet (7.5 mg total) by mouth at bedtime. 01/28/23  Yes Corwin Levins, MD  Multiple Vitamins-Minerals (MULTIVITAMIN ADULTS PO) Take 1 tablet by mouth daily with breakfast.   Yes [provider]  omeprazole (PRILOSEC) 20 MG capsule Take 1 capsule (20 mg total) by mouth daily. Patient taking differently: Take 20 mg by mouth daily before breakfast. 06/17/22  Yes Quentin Mulling R, PA-C  potassium chloride SA (KLOR-CON M) 20 MEQ tablet Take 1 tablet (20 mEq total) by mouth daily. 03/07/23  Yes Theresia Lo, Benetta Spar K, DO  tamsulosin (FLOMAX) 0.4 MG CAPS capsule Take  1 capsule (0.4 mg total) by mouth daily after supper. 08/14/22  Yes Jerald Kief, MD  temazepam (RESTORIL) 15 MG capsule TAKE 1 CAPSULE BY MOUTH AT BEDTIME AS NEEDED FOR SLEEP. Patient taking differently: Take 15 mg by mouth at bedtime as needed for sleep. 09/13/22  Yes Corwin Levins, MD  traMADol (ULTRAM) 50 MG tablet Take 1 tablet (50 mg total) by mouth every 6 (six) hours as needed (mild pain). Patient not taking: Reported on 03/07/2023 11/11/22   Manus Rudd, MD      Allergies    Ambien [zolpidem] and Atorvastatin    Review of Systems   Review of Systems  Gastrointestinal:  Positive for diarrhea.     Physical Exam Updated Vital Signs BP 103/63   Pulse 94   Temp 97.7 F (36.5 C) (Oral)   SpO2 99%  Physical Exam Vitals and nursing note reviewed.  Constitutional:      General: He is not in acute distress.    Appearance: Normal appearance.  HENT:     Head: Normocephalic and atraumatic.     Nose: Nose normal.     Mouth/Throat:     Mouth: Mucous membranes are dry.     Pharynx: Oropharynx is clear.  Eyes:     Extraocular Movements: Extraocular movements intact.     Conjunctiva/sclera: Conjunctivae normal.  Cardiovascular:     Rate and Rhythm: Normal rate and regular rhythm.     Heart sounds: Normal heart sounds.  Pulmonary:     Effort: Pulmonary effort is normal.     Breath sounds: Normal breath sounds.  Abdominal:     General: Abdomen is flat.     Palpations: Abdomen is soft.     Tenderness: There is no abdominal tenderness.  Musculoskeletal:        General: Normal range of motion.     Cervical back: Normal range of motion.  Skin:    General: Skin is warm and dry.  Neurological:     General: No focal deficit present.     Mental Status: He is alert and oriented to person, place, and time.     Sensory: No sensory deficit.     Motor: No weakness.  Psychiatric:        Mood and Affect: Mood normal.        Behavior: Behavior normal.     ED Results / Procedures / Treatments   Labs (all labs ordered are listed, but only abnormal results are displayed) Labs Reviewed  COMPREHENSIVE METABOLIC PANEL - Abnormal; Notable for the following components:      Result Value   Sodium 133 (*)    Potassium 2.8 (*)    CO2 21 (*)    Glucose, Bld 162 (*)    BUN 43 (*)    Creatinine, Ser 1.29 (*)    Calcium 8.7 (*)    GFR, Estimated 54 (*)    All other components within normal limits  CBC WITH DIFFERENTIAL/PLATELET - Abnormal; Notable for the following components:   Monocytes Absolute 1.1 (*)    All other components within normal limits  C DIFFICILE QUICK SCREEN W PCR REFLEX     GASTROINTESTINAL PANEL BY PCR, STOOL (REPLACES STOOL CULTURE)  LIPASE, BLOOD    EKG EKG Interpretation Date/Time:  Monday March 07 2023 17:58:09 EST Ventricular Rate:  112 PR Interval:    QRS Duration:  115 QT Interval:  311 QTC Calculation: 411 R Axis:   6  Text Interpretation: Atrial fibrillation Nonspecific  intraventricular conduction delay Low voltage, precordial leads Repol abnrm suggests ischemia, anterolateral Interpretation limited secondary to artifact Confirmed by Elayne Snare ([id number]) on 03/07/2023 6:38:52 PM  Radiology No results found.  Procedures Procedures    Medications Ordered in ED Medications  lactated ringers bolus 1,000 mL (0 mLs Intravenous Stopped 03/07/23 1919)  famotidine (PEPCID) tablet 20 mg (20 mg Oral Given 03/07/23 1927)  potassium chloride SA (KLOR-CON M) CR tablet 40 mEq (40 mEq Oral Given 03/07/23 1927)    ED Course/ Medical Decision Making/ A&P Clinical Course as of 03/07/23 1943  Mon Mar 07, 2023  1838 Labs with mild AKI and hypokalemia. He's receiving IVF and will be given oral potassium. Stool studies in process. [VK]  1919 C diff studies negative. [VK]    Clinical Course User Index [VK] Rexford Maus, DO                                 Medical Decision Making This patient presents to the ED with chief complaint(s) of diarrhea with pertinent past medical history of A-fib on Eliquis, GERD which further complicates the presenting complaint. The complaint involves an extensive differential diagnosis and also carries with it a high risk of complications and morbidity.    The differential diagnosis includes dehydration, electrolyte abnormality, GI bleed, infectious diarrhea, viral syndrome, arrhythmia, anemia  Additional history obtained: Additional history obtained from N/a Records reviewed Primary Care Documents  ED Course and Reassessment: On patient's arrival he is hemodynamically stable in no acute distress though is dry  appearing.  Will have labs to evaluate for dehydration, infectious diarrhea possible GI bleed and will be started on fluids given antacid and will be closely reassessed.  Independent labs interpretation:  The following labs were independently interpreted: hypokalemia, mild AKI  Independent visualization of imaging: - N/A  Consultation: - Consulted or discussed management/test interpretation w/ external professional: N/A  Consideration for admission or further workup: Patient has no emergent conditions requiring admission or further work-up at this time and is stable for discharge home with primary care follow-up  Social Determinants of health: N/A    Amount and/or Complexity of Data Reviewed Labs: ordered.  Risk Prescription drug management.          Final Clinical Impression(s) / ED Diagnoses Final diagnoses:  Diarrhea of presumed infectious origin  Hypokalemia  AKI (acute kidney injury) (HCC)    Rx / DC Orders ED Discharge Orders          Ordered    potassium chloride SA (KLOR-CON M) 20 MEQ tablet  Daily        03/07/23 1942    loperamide (IMODIUM) 2 MG capsule  4 times daily PRN        03/07/23 1942              Rexford Maus, DO 03/07/23 1943

## 2023-03-07 NOTE — ED Triage Notes (Signed)
 Patient BIB EMS from [facility]( [facility]) c/o diarrhea x 3 days. Family concern that patient might be dehydrated.  Patient denies N/V. Patient denies fever.  BP 150/92 HR 71 RR 20 O2sat 99%on RA

## 2023-03-08 ENCOUNTER — Telehealth: Payer: Self-pay | Admitting: Internal Medicine

## 2023-03-08 ENCOUNTER — Telehealth: Payer: Self-pay

## 2023-03-08 NOTE — Transitions of Care (Post Inpatient/ED Visit) (Unsigned)
   03/08/2023  Name: Phillip Shelton MRN: [id number] DOB: 07-02-1934  Today's TOC FU Call Status: Today's TOC FU Call Status:: Unsuccessful Call (1st Attempt) Unsuccessful Call (1st Attempt) Date: 03/08/23  Attempted to reach the patient regarding the most recent Inpatient/ED visit.  Follow Up Plan: Additional outreach attempts will be made to reach the patient to complete the Transitions of Care (Post Inpatient/ED visit) call.   Signature Karena Addison, LPN [facility] Nurse Health Advisor Direct Dial [phone]

## 2023-03-08 NOTE — Telephone Encounter (Signed)
Ok thanks for update

## 2023-03-08 NOTE — Telephone Encounter (Signed)
 Copied from CRM [id number]. Topic: General - Other >> Mar 07, 2023  5:08 PM Denese Killings wrote: Reason for CRM: Kia with [employer] [employer] [facility] [employer] wanted to inform doctor that patient was sent to hospital for diarrhea and for feeling very weak.

## 2023-03-09 LAB — GASTROINTESTINAL PANEL BY PCR, STOOL (REPLACES STOOL CULTURE)

## 2023-03-09 NOTE — Transitions of Care (Post Inpatient/ED Visit) (Unsigned)
   03/09/2023  Name: Phillip Shelton MRN: [id number] DOB: 02/02/1934  Today's TOC FU Call Status: Today's TOC FU Call Status:: Unsuccessful Call (2nd Attempt) Unsuccessful Call (1st Attempt) Date: 03/08/23 Unsuccessful Call (2nd Attempt) Date: 03/09/23  Attempted to reach the patient regarding the most recent Inpatient/ED visit.  Follow Up Plan: Additional outreach attempts will be made to reach the patient to complete the Transitions of Care (Post Inpatient/ED visit) call.   Signature Karena Addison, LPN [facility] Nurse Health Advisor Direct Dial [phone]

## 2023-03-10 ENCOUNTER — Observation Stay (HOSPITAL_COMMUNITY)

## 2023-03-10 ENCOUNTER — Emergency Department (HOSPITAL_COMMUNITY)

## 2023-03-10 ENCOUNTER — Encounter (HOSPITAL_COMMUNITY): Payer: Self-pay

## 2023-03-10 ENCOUNTER — Observation Stay (HOSPITAL_COMMUNITY)
Admission: EM | Admit: 2023-03-10 | Discharge: 2023-03-11 | Disposition: A | Discharge status: Home / Self Care | Attending: Family Medicine | Admitting: Family Medicine

## 2023-03-10 ENCOUNTER — Other Ambulatory Visit: Payer: Self-pay

## 2023-03-10 DIAGNOSIS — Z79899 Other long term (current) drug therapy: Secondary | ICD-10-CM | POA: Diagnosis not present

## 2023-03-10 DIAGNOSIS — R55 Syncope and collapse: Secondary | ICD-10-CM | POA: Diagnosis not present

## 2023-03-10 DIAGNOSIS — Z1152 Encounter for screening for COVID-19: Secondary | ICD-10-CM | POA: Insufficient documentation

## 2023-03-10 DIAGNOSIS — R531 Weakness: Secondary | ICD-10-CM | POA: Diagnosis not present

## 2023-03-10 DIAGNOSIS — M5030 Other cervical disc degeneration, unspecified cervical region: Secondary | ICD-10-CM

## 2023-03-10 DIAGNOSIS — E876 Hypokalemia: Principal | ICD-10-CM | POA: Insufficient documentation

## 2023-03-10 DIAGNOSIS — R9089 Other abnormal findings on diagnostic imaging of central nervous system: Secondary | ICD-10-CM | POA: Diagnosis not present

## 2023-03-10 DIAGNOSIS — R937 Abnormal findings on diagnostic imaging of other parts of musculoskeletal system: Secondary | ICD-10-CM | POA: Insufficient documentation

## 2023-03-10 DIAGNOSIS — R197 Diarrhea, unspecified: Secondary | ICD-10-CM | POA: Diagnosis not present

## 2023-03-10 DIAGNOSIS — M6281 Muscle weakness (generalized): Secondary | ICD-10-CM | POA: Insufficient documentation

## 2023-03-10 DIAGNOSIS — E878 Other disorders of electrolyte and fluid balance, not elsewhere classified: Secondary | ICD-10-CM | POA: Insufficient documentation

## 2023-03-10 DIAGNOSIS — S0990XA Unspecified injury of head, initial encounter: Secondary | ICD-10-CM | POA: Diagnosis not present

## 2023-03-10 DIAGNOSIS — N179 Acute kidney failure, unspecified: Secondary | ICD-10-CM | POA: Diagnosis not present

## 2023-03-10 DIAGNOSIS — N400 Enlarged prostate without lower urinary tract symptoms: Secondary | ICD-10-CM | POA: Insufficient documentation

## 2023-03-10 DIAGNOSIS — R5381 Other malaise: Secondary | ICD-10-CM | POA: Diagnosis not present

## 2023-03-10 DIAGNOSIS — S299XXA Unspecified injury of thorax, initial encounter: Secondary | ICD-10-CM | POA: Diagnosis not present

## 2023-03-10 DIAGNOSIS — R404 Transient alteration of awareness: Secondary | ICD-10-CM | POA: Diagnosis not present

## 2023-03-10 DIAGNOSIS — I499 Cardiac arrhythmia, unspecified: Secondary | ICD-10-CM | POA: Diagnosis not present

## 2023-03-10 DIAGNOSIS — I482 Chronic atrial fibrillation, unspecified: Secondary | ICD-10-CM | POA: Diagnosis not present

## 2023-03-10 DIAGNOSIS — Z87891 Personal history of nicotine dependence: Secondary | ICD-10-CM | POA: Insufficient documentation

## 2023-03-10 DIAGNOSIS — W19XXXA Unspecified fall, initial encounter: Secondary | ICD-10-CM | POA: Diagnosis not present

## 2023-03-10 DIAGNOSIS — K219 Gastro-esophageal reflux disease without esophagitis: Secondary | ICD-10-CM | POA: Diagnosis not present

## 2023-03-10 DIAGNOSIS — R2689 Other abnormalities of gait and mobility: Secondary | ICD-10-CM | POA: Diagnosis not present

## 2023-03-10 DIAGNOSIS — I6782 Cerebral ischemia: Secondary | ICD-10-CM | POA: Diagnosis not present

## 2023-03-10 DIAGNOSIS — M47811 Spondylosis without myelopathy or radiculopathy, occipito-atlanto-axial region: Secondary | ICD-10-CM | POA: Diagnosis not present

## 2023-03-10 DIAGNOSIS — M4802 Spinal stenosis, cervical region: Secondary | ICD-10-CM | POA: Diagnosis not present

## 2023-03-10 LAB — COMPREHENSIVE METABOLIC PANEL
ALT: 16 U/L (ref 0–44)
AST: 23 U/L (ref 15–41)
Albumin: 3.2 g/dL — ABNORMAL LOW (ref 3.5–5.0)
Alkaline Phosphatase: 42 U/L (ref 38–126)
Anion gap: 14 (ref 5–15)
BUN: 65 mg/dL — ABNORMAL HIGH (ref 8–23)
CO2: 19 mmol/L — ABNORMAL LOW (ref 22–32)
Calcium: 8.6 mg/dL — ABNORMAL LOW (ref 8.9–10.3)
Chloride: 103 mmol/L (ref 98–111)
Creatinine, Ser: 1.52 mg/dL — ABNORMAL HIGH (ref 0.61–1.24)
GFR, Estimated: 44 mL/min — ABNORMAL LOW (ref >60)
Glucose, Bld: 107 mg/dL — ABNORMAL HIGH (ref 70–99)
Potassium: 2.5 mmol/L — CL (ref 3.5–5.1)
Sodium: 136 mmol/L (ref 135–145)
Total Bilirubin: 1.3 mg/dL — ABNORMAL HIGH (ref 0.0–1.2)
Total Protein: 6.1 g/dL — ABNORMAL LOW (ref 6.5–8.1)

## 2023-03-10 LAB — C DIFFICILE QUICK SCREEN W PCR REFLEX
C Diff antigen: NEGATIVE
C Diff interpretation: NOT DETECTED
C Diff toxin: NEGATIVE

## 2023-03-10 LAB — PROTIME-INR
INR: 1.3 — ABNORMAL HIGH (ref 0.8–1.2)
Prothrombin Time: 16.2 s — ABNORMAL HIGH (ref 11.4–15.2)

## 2023-03-10 LAB — CBC
HCT: 37.4 % — ABNORMAL LOW (ref 39.0–52.0)
Hemoglobin: 13.1 g/dL (ref 13.0–17.0)
MCH: 31.3 pg (ref 26.0–34.0)
MCHC: 35 g/dL (ref 30.0–36.0)
MCV: 89.5 fL (ref 80.0–100.0)
Platelets: 209 10*3/uL (ref 150–400)
RBC: 4.18 MIL/uL — ABNORMAL LOW (ref 4.22–5.81)
RDW: 13.2 % (ref 11.5–15.5)
WBC: 5.5 10*3/uL (ref 4.0–10.5)
nRBC: 0 % (ref 0.0–0.2)

## 2023-03-10 LAB — URINALYSIS, ROUTINE W REFLEX MICROSCOPIC
Bilirubin Urine: NEGATIVE
Glucose, UA: NEGATIVE mg/dL
Hgb urine dipstick: NEGATIVE
Ketones, ur: NEGATIVE mg/dL
Leukocytes,Ua: NEGATIVE
Nitrite: NEGATIVE
Protein, ur: NEGATIVE mg/dL
Specific Gravity, Urine: 1.021 (ref 1.005–1.030)
pH: 5 (ref 5.0–8.0)

## 2023-03-10 LAB — TROPONIN I (HIGH SENSITIVITY)
Troponin I (High Sensitivity): 16 ng/L (ref <18)
Troponin I (High Sensitivity): 13 ng/L (ref <18)

## 2023-03-10 LAB — I-STAT CHEM 8, ED
BUN: 61 mg/dL — ABNORMAL HIGH (ref 8–23)
Calcium, Ion: 1.09 mmol/L — ABNORMAL LOW (ref 1.15–1.40)
Chloride: 106 mmol/L (ref 98–111)
Creatinine, Ser: 1.6 mg/dL — ABNORMAL HIGH (ref 0.61–1.24)
Glucose, Bld: 103 mg/dL — ABNORMAL HIGH (ref 70–99)
HCT: 38 % — ABNORMAL LOW (ref 39.0–52.0)
Hemoglobin: 12.9 g/dL — ABNORMAL LOW (ref 13.0–17.0)
Potassium: 2.8 mmol/L — ABNORMAL LOW (ref 3.5–5.1)
Sodium: 138 mmol/L (ref 135–145)
TCO2: 21 mmol/L — ABNORMAL LOW (ref 22–32)

## 2023-03-10 LAB — RESP PANEL BY RT-PCR (RSV, FLU A&B, COVID)  RVPGX2
Influenza A by PCR: NEGATIVE
Influenza B by PCR: NEGATIVE
Resp Syncytial Virus by PCR: NEGATIVE
SARS Coronavirus 2 by RT PCR: NEGATIVE

## 2023-03-10 LAB — SAMPLE TO BLOOD BANK

## 2023-03-10 LAB — POTASSIUM: Potassium: 2.5 mmol/L — CL (ref 3.5–5.1)

## 2023-03-10 LAB — I-STAT CG4 LACTIC ACID, ED: Lactic Acid, Venous: 1.4 mmol/L (ref 0.5–1.9)

## 2023-03-10 LAB — MAGNESIUM: Magnesium: 1.9 mg/dL (ref 1.7–2.4)

## 2023-03-10 LAB — TSH: TSH: 0.779 u[IU]/mL (ref 0.350–4.500)

## 2023-03-10 LAB — ETHANOL: Alcohol, Ethyl (B): <10 mg/dL (ref <10)

## 2023-03-10 MED ORDER — SODIUM CHLORIDE 0.9% FLUSH
3.0000 mL | Freq: Two times a day (BID) | INTRAVENOUS | Status: DC
  Administered 2023-03-10 – 2023-03-11 (×2): 3 mL via INTRAVENOUS

## 2023-03-10 MED ORDER — DEXTROSE-SODIUM CHLORIDE 5-0.9 % IV SOLN: INTRAVENOUS | Status: AC

## 2023-03-10 MED ORDER — SODIUM CHLORIDE 0.9 % IV BOLUS
1000.0000 mL | Freq: Once | INTRAVENOUS | Status: AC
  Administered 2023-03-10: 1000 mL via INTRAVENOUS

## 2023-03-10 MED ORDER — MIRTAZAPINE 15 MG PO TABS
7.5000 mg | ORAL_TABLET | Freq: Every day | ORAL | Status: DC
  Administered 2023-03-10: 7.5 mg via ORAL
  Filled 2023-03-10: qty 1

## 2023-03-10 MED ORDER — VITAMIN B-12 1000 MCG PO TABS
1000.0000 ug | ORAL_TABLET | Freq: Every day | ORAL | Status: DC
  Administered 2023-03-10 – 2023-03-11 (×2): 1000 ug via ORAL
  Filled 2023-03-10 (×2): qty 1

## 2023-03-10 MED ORDER — PANTOPRAZOLE SODIUM 40 MG IV SOLR
40.0000 mg | Freq: Every day | INTRAVENOUS | Status: DC
  Administered 2023-03-10 – 2023-03-11 (×2): 40 mg via INTRAVENOUS
  Filled 2023-03-10 (×2): qty 10

## 2023-03-10 MED ORDER — ACETAMINOPHEN 325 MG PO TABS: 650.0000 mg | ORAL_TABLET | Freq: Four times a day (QID) | ORAL | Status: DC | PRN

## 2023-03-10 MED ORDER — HYDROCODONE-ACETAMINOPHEN 5-325 MG PO TABS: 1.0000 | ORAL_TABLET | ORAL | Status: DC | PRN

## 2023-03-10 MED ORDER — POTASSIUM CHLORIDE CRYS ER 20 MEQ PO TBCR
20.0000 meq | EXTENDED_RELEASE_TABLET | Freq: Once | ORAL | Status: AC
  Administered 2023-03-10: 20 meq via ORAL
  Filled 2023-03-10: qty 1

## 2023-03-10 MED ORDER — POTASSIUM CHLORIDE CRYS ER 20 MEQ PO TBCR
40.0000 meq | EXTENDED_RELEASE_TABLET | ORAL | Status: AC
  Administered 2023-03-10 – 2023-03-11 (×3): 40 meq via ORAL
  Filled 2023-03-10 (×3): qty 2

## 2023-03-10 MED ORDER — FINASTERIDE 5 MG PO TABS
5.0000 mg | ORAL_TABLET | Freq: Every day | ORAL | Status: DC
  Administered 2023-03-10 – 2023-03-11 (×2): 5 mg via ORAL
  Filled 2023-03-10 (×2): qty 1

## 2023-03-10 MED ORDER — TAMSULOSIN HCL 0.4 MG PO CAPS: 0.4000 mg | ORAL_CAPSULE | Freq: Every day | ORAL | Status: DC

## 2023-03-10 MED ORDER — MORPHINE SULFATE (PF) 2 MG/ML IV SOLN: 2.0000 mg | INTRAVENOUS | Status: DC | PRN

## 2023-03-10 MED ORDER — POTASSIUM CHLORIDE 10 MEQ/100ML IV SOLN: 10.0000 meq | INTRAVENOUS | Status: DC

## 2023-03-10 MED ORDER — ACETAMINOPHEN 650 MG RE SUPP: 650.0000 mg | Freq: Four times a day (QID) | RECTAL | Status: DC | PRN

## 2023-03-10 MED ORDER — POTASSIUM CHLORIDE 10 MEQ/100ML IV SOLN
10.0000 meq | INTRAVENOUS | Status: AC
  Administered 2023-03-10 (×2): 10 meq via INTRAVENOUS
  Filled 2023-03-10 (×2): qty 100

## 2023-03-10 MED ORDER — APIXABAN 5 MG PO TABS: 5.0000 mg | ORAL_TABLET | Freq: Two times a day (BID) | ORAL | Status: DC

## 2023-03-10 MED ORDER — APIXABAN 2.5 MG PO TABS
2.5000 mg | ORAL_TABLET | Freq: Two times a day (BID) | ORAL | Status: DC
  Administered 2023-03-10: 2.5 mg via ORAL
  Filled 2023-03-10 (×2): qty 1

## 2023-03-10 NOTE — Assessment & Plan Note (Signed)
 Ct showed concern for infection, mri done shows no infection but does have severe stenosis  DDD.  No alarm symptoms / or incontinence or fall or paresthesia.

## 2023-03-10 NOTE — Progress Notes (Signed)
   03/10/23 2015  Provider Notification  Provider Name/Title Gery Pray  Date Provider Notified 03/10/23  Time Provider Notified 2015  Method of Notification  (secure chat)  Notification Reason Other (Comment) (Potassium lab result)  Test performed and critical result potassium 2.5  Date Critical Result Received 03/10/23  Time Critical Result Received 2010  Provider response See new orders  Date of Provider Response 03/10/23  Time of Provider Response 2020

## 2023-03-10 NOTE — ED Notes (Signed)
 Patient transported to MRI

## 2023-03-10 NOTE — Assessment & Plan Note (Signed)
 2/2 to norovirus. We will however get c.diff/ culture.  Enteric isolation.

## 2023-03-10 NOTE — Transitions of Care (Post Inpatient/ED Visit) (Signed)
 03/10/2023  Name: Phillip Shelton MRN: [id number] DOB: 04/10/1934  Today's TOC FU Call Status: Today's TOC FU Call Status:: Successful TOC FU Call Completed Unsuccessful Call (1st Attempt) Date: 03/08/23 Unsuccessful Call (2nd Attempt) Date: 03/09/23 [facility] FU Call Complete Date: 03/10/23 Patient's Name and Date of Birth confirmed.  Transition Care Management Follow-up Telephone Call Date of Discharge: 03/07/23 Discharge Facility: [facility]) Type of Discharge: Emergency Department Reason for ED Visit: Other: (hypokalemia diarrhea) How have you been since you were released from the hospital?: Same Any questions or concerns?: No  Items Reviewed: Did you receive and understand the discharge instructions provided?: Yes Medications obtained,verified, and reconciled?: Yes (Medications Reviewed) Any new allergies since your discharge?: No Dietary orders reviewed?: Yes Do you have support at home?: Yes People in Home: spouse  Medications Reviewed Today: Medications Reviewed Today     Reviewed by Karena Addison, LPN (Licensed Practical Nurse) on 03/10/23 at 1519  Med List Status: <None>   Medication Order Taking? Sig Documenting Provider Last Dose Status Informant  acetaminophen (TYLENOL) 500 MG tablet [id number] No Take 1,000 mg by mouth 3 (three) times daily. [provider] 03/10/2023 Morning Active Nursing Home Medication Administration Guide (MAG), Pharmacy Records, Multiple Informants  apixaban (ELIQUIS) 5 MG TABS tablet [id number] No Take 1 tablet (5 mg total) by mouth 2 (two) times daily. Jerald Kief, MD 03/10/2023  8:00 AM Active Nursing Home Medication Administration Guide (MAG), Pharmacy Records, Multiple Informants  cyanocobalamin (VITAMIN B12) 1000 MCG tablet [id number] No Take 1,000 mcg by mouth daily. [provider] 03/07/2023 Active Nursing Home Medication Administration Guide (MAG), Pharmacy Records, Multiple Informants  diclofenac Sodium (VOLTAREN) 1  % GEL [id number] No Apply 4 g topically 4 (four) times daily.  Patient taking differently: Apply 4 g topically in the morning, at noon, in the evening, and at bedtime. Both legs   Corwin Levins, MD 03/10/2023 Active Nursing Home Medication Administration Guide (MAG), Pharmacy Records, Multiple Informants  finasteride (PROSCAR) 5 MG tablet [id number] No Take 1 tablet (5 mg total) by mouth daily.  Patient taking differently: Take 5 mg by mouth at bedtime.   Corwin Levins, MD 03/08/2023 Active Nursing Home Medication Administration Guide (MAG), Pharmacy Records, Multiple Informants  fish oil-omega-3 fatty acids 1000 MG capsule [id number] No Take 1 capsule by mouth at bedtime. [provider] 03/08/2023 Active Nursing Home Medication Administration Guide (MAG), Pharmacy Records, Multiple Informants  hydrochlorothiazide (MICROZIDE) 12.5 MG capsule [id number] No TAKE ONE CAPSULE BY MOUTH EVERY DAY FOR FLUID  Patient taking differently: Take 12.5 mg by mouth daily.   Corwin Levins, MD 03/10/2023 Active Nursing Home Medication Administration Guide (MAG), Pharmacy Records, Multiple Informants  metoprolol tartrate (LOPRESSOR) 25 MG tablet [id number] No Take 0.5 tablets (12.5 mg total) by mouth 2 (two) times daily. Jerald Kief, MD 03/10/2023 Active Nursing Home Medication Administration Guide (MAG), Pharmacy Records, Multiple Informants  mirtazapine (REMERON) 7.5 MG tablet [id number] No Take 1 tablet (7.5 mg total) by mouth at bedtime. Corwin Levins, MD 03/05/2023 Active Nursing Home Medication Administration Guide (MAG), Pharmacy Records, Multiple Informants  Multiple Vitamins-Minerals (MULTIVITAMIN ADULTS PO) [id number] No Take 1 tablet by mouth daily with breakfast. [provider] 03/07/2023 Active Nursing Home Medication Administration Guide (MAG), Pharmacy Records, Multiple Informants  omeprazole (PRILOSEC) 20 MG capsule [id number] No Take 1 capsule (20 mg total) by mouth daily.  Patient taking differently:  Take 20 mg by mouth daily before breakfast.   Doree Albee, PA-C  03/07/2023 Active Nursing Home Medication Administration Guide (MAG), Pharmacy Records, Multiple Informants  potassium chloride SA (KLOR-CON M) 20 MEQ tablet [id number] No Take 1 tablet (20 mEq total) by mouth daily.  Patient not taking: Reported on 03/10/2023   Rexford Maus, DO Not Taking Active Nursing Home Medication Administration Guide (MAG), Pharmacy Records, Multiple Informants  tamsulosin [facility]) 0.4 MG CAPS capsule [id number] No Take 1 capsule (0.4 mg total) by mouth daily after supper. Jerald Kief, MD 03/07/2023 Active Nursing Home Medication Administration Guide (MAG), Pharmacy Records, Multiple Informants  temazepam (RESTORIL) 15 MG capsule [id number] No TAKE 1 CAPSULE BY MOUTH AT BEDTIME AS NEEDED FOR SLEEP.  Patient taking differently: Take 15 mg by mouth at bedtime as needed for sleep.   Corwin Levins, MD Taking Active Nursing Home Medication Administration Guide (MAG), Pharmacy Records, Multiple Informants           Med Note Cheron Schaumann, [place]   Thu Mar 10, 2023 11:00 AM) No last dose.   Med List Note Vela Prose 03/07/23 1633): [facility] [address], [place], [place] [address]  [phone]            Home Care and Equipment/Supplies: Were Home Health Services Ordered?: NA Any new equipment or medical supplies ordered?: NA  Functional Questionnaire: Do you need assistance with bathing/showering or dressing?: No Do you need assistance with meal preparation?: No Do you need assistance with eating?: No Do you have difficulty maintaining continence: No Do you need assistance with getting out of bed/getting out of a chair/moving?: No Do you have difficulty managing or taking your medications?: No  Follow up appointments reviewed: PCP Follow-up appointment confirmed?: No (patient back at ER) MD Provider Line [phone] Given: No Specialist Hospital Follow-up  appointment confirmed?: NA Do you need transportation to your follow-up appointment?: No Do you understand care options if your condition(s) worsen?: Yes-patient verbalized understanding    SIGNATURE Karena Addison, LPN [facility] Nurse Health Advisor Direct Dial [phone]

## 2023-03-10 NOTE — Assessment & Plan Note (Signed)
 Lab Results  Component Value Date   CREATININE 1.60 (H) 03/10/2023   CREATININE 1.52 (H) 03/10/2023   CREATININE 1.29 (H) 03/07/2023  Hold BP meds, renally dose meds and avoid contrast.

## 2023-03-10 NOTE — ED Triage Notes (Signed)
 Pt arrived via [facility] from [facility]. Pt was sitting on toilet and had a syncope episode and hit head on wall. Pt is on eliquis. Pt denies pain. Pt has a skin tear on right elbow. Pt is A&Ox4.

## 2023-03-10 NOTE — Assessment & Plan Note (Signed)
 Hypokalemia from GI losses.  Replace and follow levels.

## 2023-03-10 NOTE — Assessment & Plan Note (Signed)
 Pt in a.fib and eliquis continued.  BP meds held and we will resume in am.

## 2023-03-10 NOTE — Progress Notes (Signed)
   03/10/23 1007  Spiritual Encounters  Type of Visit Initial  Care provided to: Patient  Reason for visit Trauma  OnCall Visit Yes  Spiritual Framework  Presenting Themes Meaning/purpose/sources of inspiration;Goals in life/care;Caregiving needs;Values and beliefs;Impactful experiences and emotions  Community/Connection Family;Faith community  Patient Stress Factors Health changes;Loss of control  Interventions  Spiritual Care Interventions Made Established relationship of care and support;Compassionate presence;Reflective listening;Normalization of emotions  Intervention Outcomes  Outcomes Connection to spiritual care;Awareness around self/spiritual resourses;Awareness of support;Reduced anxiety  Advance Directives (For Healthcare)  Does Patient Have a Medical Advance Directive? No  Would patient like information on creating a medical advance directive? No - Patient declined  Mental Health Advance Directives  Does Patient Have a Mental Health Advance Directive? No  Would patient like information on creating a mental health advance directive? No - Patient declined   Chaplain responded to trauma level 2. Pt mentioned that he has had a diarrheal illness for the last 3 days and feels like he is getting dehydrated. He declared that he was in the bathroom and all of a sudden, he fell and hit his head. He stayed in there 45 minutes. His wife can't walk, but she has beeper on her. Even though she beeped many times, the help didn't come immediately. He complained about the place that they live in with his wife for six months. He also shared good moments that they had with his grandchild. Chaplain reflectively listened and provided emotional support.   M.Kubra Delano Metz Resident [phone]

## 2023-03-10 NOTE — Assessment & Plan Note (Signed)
IV PPI therapy.  Aspiration precaution.

## 2023-03-10 NOTE — Assessment & Plan Note (Signed)
 Patient admitted for syncope and collapse and fall while trying to go to the bathroom, attribute to patient being dehydrated and hypotensive from diarrhea where he was diagnosed with norovirus about 3 days ago.  No recent antibiotic use. Fall precautions, orthostatic vitals.

## 2023-03-10 NOTE — H&P (Signed)
 History and Physical    Patient: Phillip Shelton HYQ:[id number] DOB: May 03, 1934 DOA: 03/10/2023 DOS: the patient was seen and examined on 03/10/2023 PCP: Corwin Levins, MD  Patient coming from: SNF Chief complaint: Chief Complaint  Patient presents with   fall on thinners   HPI:  Phillip Shelton is a 89 y.o. male with past medical history  of A-fib on Eliquis, hypertension, BPH, allergies to Ambien and atorvastatin, resident of [facility] brought to the hospital for weakness and fall.  Patient was up going to the bathroom when he became dizzy fell and struck his head on the wall.  No loss of consciousness was reported.  Patient has been weak for the past few days and has been having diarrhea that is been watery.  Patient was seen on 3 March for diarrhea and dehydration and generalized weakness.  Initially patient did have nausea and vomiting that had resolved but his diarrhea has remained persistent along with some generalized abdominal cramping that is been intermittent and resolved.  >>ED Course: In emergency room  Vitals:   03/10/23 1230 03/10/23 1317 03/10/23 1330 03/10/23 1543  BP: 98/61  102/83 110/73  Pulse: 87  84 (!) 108  Temp:  98.4 F (36.9 C)  98.7 F (37.1 C)  Resp: 13  17 18   Height:      Weight:      SpO2: 98%  99% 98%  TempSrc:  Oral    BMI (Calculated):       ED evaluation  so far shows: Initial CMP showing hypokalemia of 2.5 bicarb of 19 anion gap of 14, glucose 107 BUN 65 creatinine 1.52 EGFR of 44 calcium 8.6, LFTs showing albumin 3.2 total protein 6.1 total bili 1.3 and magnesium of 1.9.  Lactic acid of 1.4 Ethyl alcohol level less than 10. Patient had stool studies done on 3 March which showed is positive for norovirus  In the emergency room  pt has received the following treatment thus far: Medications  potassium chloride 10 mEq in 100 mL IVPB (10 mEq Intravenous New Bag/Given 03/10/23 1150)  sodium chloride flush (NS) 0.9 %  injection 3 mL (has no administration in time range)  acetaminophen (TYLENOL) tablet 650 mg (has no administration in time range)    Or  acetaminophen (TYLENOL) suppository 650 mg (has no administration in time range)  HYDROcodone-acetaminophen (NORCO/VICODIN) 5-325 MG per tablet 1 tablet (has no administration in time range)  morphine (PF) 2 MG/ML injection 2 mg (has no administration in time range)  dextrose 5 %-0.9 % sodium chloride infusion (has no administration in time range)  pantoprazole (PROTONIX) injection 40 mg (has no administration in time range)  finasteride (PROSCAR) tablet 5 mg (has no administration in time range)  sodium chloride 0.9 % bolus 1,000 mL (1,000 mLs Intravenous New Bag/Given 03/10/23 1019)  sodium chloride 0.9 % bolus 1,000 mL (1,000 mLs Intravenous New Bag/Given 03/10/23 1019)  potassium chloride SA (KLOR-CON M) CR tablet 20 mEq (20 mEq Oral Given 03/10/23 1145)   Review of Systems  Constitutional:  Positive for malaise/fatigue.  Gastrointestinal:  Positive for abdominal pain, diarrhea, nausea and vomiting.  Neurological:  Positive for weakness.   Past Medical History:  Diagnosis Date   Arthritis    Atrial fibrillation (HCC)    Blood in stool 02/12/2010   Qualifier: Diagnosis of  By: Misty Stanley CMA (AAMA), Amanda     BPH (benign prostatic hyperplasia) 01/18/2020   Complication of anesthesia    Diverticulosis  Esophageal stricture    GERD 02/12/2010   Qualifier: Diagnosis of  By: Misty Stanley CMA (AAMA), Marchelle Folks     GERD (gastroesophageal reflux disease)    Hemorrhoids    Hiatal hernia    Idiopathic Charcot's joint of foot 12/27/2012   Mallory - Weiss tear 1981   OAB (overactive bladder) 12/27/2012   Osteoarthritis of right hip 09/19/2014   PONV (postoperative nausea and vomiting)    Pure hypercholesterolemia 12/27/2012   Past Surgical History:  Procedure Laterality Date   CHOLECYSTECTOMY     COLONOSCOPY     GASTROCNEMIUS RECESSION  02/03/2011    Procedure: GASTROCNEMIUS SLIDE;  Surgeon: Sherri Rad, MD;  Location: [facility];  Service: Orthopedics;  Laterality: Right;   HAND SURGERY  jan 2016   rt hand   HEMORRHOID SURGERY  1990   INGUINAL HERNIA REPAIR Right 11/10/2022   Procedure: RIGHT INGUINAL HERNIA REPAIR WITH MESH;  Surgeon: Manus Rudd, MD;  Location: [facility];  Service: General;  Laterality: Right;  LMA & TAP BLOCK   TOTAL HIP ARTHROPLASTY Right 09/19/2014   Procedure: RIGHT TOTAL HIP ARTHROPLASTY ANTERIOR APPROACH;  Surgeon: Samson Frederic, MD;  Location: [facility];  Service: Orthopedics;  Laterality: Right;   UMBILICAL HERNIA REPAIR     UPPER GASTROINTESTINAL ENDOSCOPY      reports that he quit smoking about 43 years ago. His smoking use included cigarettes. He has never used smokeless tobacco. He reports that he does not currently use alcohol. He reports that he does not use drugs.  Allergies  Allergen Reactions   Ambien [Zolpidem] Other (See Comments)    Patient did not like the effects of this, per family   Atorvastatin Other (See Comments)    "Made me feel badly and I could not sleep"- "Allergic," per facility    Family History  Problem Relation Age of Onset   Arthritis Father    Hypertension Father    Colon cancer Maternal Uncle    Diabetes Neg Hx    Heart disease Neg Hx    Stomach cancer Neg Hx    Rectal cancer Neg Hx     Prior to Admission medications   Medication Sig Start Date End Date Taking? Authorizing Provider  acetaminophen (TYLENOL) 500 MG tablet Take 1,000 mg by mouth 3 (three) times daily.   Yes [provider]  apixaban (ELIQUIS) 5 MG TABS tablet Take 1 tablet (5 mg total) by mouth 2 (two) times daily. 08/14/22  Yes Jerald Kief, MD  cyanocobalamin (VITAMIN B12) 1000 MCG tablet Take 1,000 mcg by mouth daily.   Yes [provider]  diclofenac Sodium (VOLTAREN) 1 % GEL Apply 4 g topically 4 (four) times daily. Patient taking differently: Apply 4 g topically in  the morning, at noon, in the evening, and at bedtime. Both legs 09/13/22  Yes Corwin Levins, MD  finasteride (PROSCAR) 5 MG tablet Take 1 tablet (5 mg total) by mouth daily. Patient taking differently: Take 5 mg by mouth at bedtime. 12/17/21  Yes Corwin Levins, MD  fish oil-omega-3 fatty acids 1000 MG capsule Take 1 capsule by mouth at bedtime.   Yes [provider]  hydrochlorothiazide (MICROZIDE) 12.5 MG capsule TAKE ONE CAPSULE BY MOUTH EVERY DAY FOR FLUID Patient taking differently: Take 12.5 mg by mouth daily. 02/14/23  Yes Corwin Levins, MD  metoprolol tartrate (LOPRESSOR) 25 MG tablet Take 0.5 tablets (12.5 mg total) by mouth 2 (two) times daily. 08/14/22 03/10/23 Yes Rickey Barbara  K, MD  mirtazapine (REMERON) 7.5 MG tablet Take 1 tablet (7.5 mg total) by mouth at bedtime. 01/28/23  Yes Corwin Levins, MD  Multiple Vitamins-Minerals (MULTIVITAMIN ADULTS PO) Take 1 tablet by mouth daily with breakfast.   Yes [provider]  omeprazole (PRILOSEC) 20 MG capsule Take 1 capsule (20 mg total) by mouth daily. Patient taking differently: Take 20 mg by mouth daily before breakfast. 06/17/22  Yes Doree Albee, PA-C  tamsulosin (FLOMAX) 0.4 MG CAPS capsule Take 1 capsule (0.4 mg total) by mouth daily after supper. 08/14/22  Yes Jerald Kief, MD  temazepam (RESTORIL) 15 MG capsule TAKE 1 CAPSULE BY MOUTH AT BEDTIME AS NEEDED FOR SLEEP. Patient taking differently: Take 15 mg by mouth at bedtime as needed for sleep. 09/13/22  Yes Corwin Levins, MD  potassium chloride SA (KLOR-CON M) 20 MEQ tablet Take 1 tablet (20 mEq total) by mouth daily. Patient not taking: Reported on 03/10/2023 03/07/23   Elayne Snare K, DO     Vitals:   03/10/23 1230 03/10/23 1317 03/10/23 1330 03/10/23 1543  BP: 98/61  102/83 110/73  Pulse: 87  84 (!) 108  Resp: 13  17 18   Temp:  98.4 F (36.9 C)  98.7 F (37.1 C)  TempSrc:  Oral    SpO2: 98%  99% 98%  Weight:      Height:       Physical Exam Vitals  and nursing note reviewed.  Constitutional:      General: He is not in acute distress. HENT:     Head: Normocephalic and atraumatic.     Right Ear: Hearing normal.     Left Ear: Hearing normal.     Nose: Nose normal. No nasal deformity.     Mouth/Throat:     Lips: Pink.     Tongue: No lesions.     Pharynx: Oropharynx is clear.  Eyes:     General: Lids are normal.     Extraocular Movements: Extraocular movements intact.  Cardiovascular:     Rate and Rhythm: Normal rate and regular rhythm.     Heart sounds: Normal heart sounds.  Pulmonary:     Effort: Pulmonary effort is normal.     Breath sounds: Normal breath sounds.  Abdominal:     General: Bowel sounds are normal. There is no distension.     Palpations: Abdomen is soft. There is no mass.     Tenderness: There is no abdominal tenderness.  Musculoskeletal:     Right lower leg: No edema.     Left lower leg: No edema.  Skin:    General: Skin is warm.  Neurological:     General: No focal deficit present.     Mental Status: He is alert and oriented to person, place, and time.     Cranial Nerves: Cranial nerves 2-12 are intact.  Psychiatric:        Attention and Perception: Attention normal.        Mood and Affect: Mood normal.        Speech: Speech normal.        Behavior: Behavior normal. Behavior is cooperative.      Labs on Admission: I have personally reviewed following labs and imaging studies Results for orders placed or performed during the hospital encounter of 03/10/23 (from the past 24 hours)  Resp panel by RT-PCR (RSV, Flu A&B, Covid) Anterior Nasal Swab     Status: None   Collection Time: 03/10/23  9:25 AM   Specimen: Anterior Nasal Swab  Result Value Ref Range   SARS Coronavirus 2 by RT PCR NEGATIVE NEGATIVE   Influenza A by PCR NEGATIVE NEGATIVE   Influenza B by PCR NEGATIVE NEGATIVE   Resp Syncytial Virus by PCR NEGATIVE NEGATIVE  Comprehensive metabolic panel     Status: Abnormal   Collection Time:  03/10/23  9:25 AM  Result Value Ref Range   Sodium 136 135 - 145 mmol/L   Potassium 2.5 (LL) 3.5 - 5.1 mmol/L   Chloride 103 98 - 111 mmol/L   CO2 19 (L) 22 - 32 mmol/L   Glucose, Bld 107 (H) 70 - 99 mg/dL   BUN 65 (H) 8 - 23 mg/dL   Creatinine, Ser 1.61 (H) 0.61 - 1.24 mg/dL   Calcium 8.6 (L) 8.9 - 10.3 mg/dL   Total Protein 6.1 (L) 6.5 - 8.1 g/dL   Albumin 3.2 (L) 3.5 - 5.0 g/dL   AST 23 15 - 41 U/L   ALT 16 0 - 44 U/L   Alkaline Phosphatase 42 38 - 126 U/L   Total Bilirubin 1.3 (H) 0.0 - 1.2 mg/dL   GFR, Estimated 44 (L) >60 mL/min   Anion gap 14 5 - 15  CBC     Status: Abnormal   Collection Time: 03/10/23  9:25 AM  Result Value Ref Range   WBC 5.5 4.0 - 10.5 K/uL   RBC 4.18 (L) 4.22 - 5.81 MIL/uL   Hemoglobin 13.1 13.0 - 17.0 g/dL   HCT 09.6 (L) 04.5 - 40.9 %   MCV 89.5 80.0 - 100.0 fL   MCH 31.3 26.0 - 34.0 pg   MCHC 35.0 30.0 - 36.0 g/dL   RDW 81.1 91.4 - 78.2 %   Platelets 209 150 - 400 K/uL   nRBC 0.0 0.0 - 0.2 %  Ethanol     Status: None   Collection Time: 03/10/23  9:25 AM  Result Value Ref Range   Alcohol, Ethyl (B) <10 <10 mg/dL  Protime-INR     Status: Abnormal   Collection Time: 03/10/23  9:25 AM  Result Value Ref Range   Prothrombin Time 16.2 (H) 11.4 - 15.2 seconds   INR 1.3 (H) 0.8 - 1.2  Troponin I (High Sensitivity)     Status: None   Collection Time: 03/10/23  9:37 AM  Result Value Ref Range   Troponin I (High Sensitivity) 16 <18 ng/L  Magnesium     Status: None   Collection Time: 03/10/23  9:37 AM  Result Value Ref Range   Magnesium 1.9 1.7 - 2.4 mg/dL  Sample to Blood Bank     Status: None   Collection Time: 03/10/23  9:40 AM  Result Value Ref Range   Blood Bank Specimen SAMPLE AVAILABLE FOR TESTING    Sample Expiration      03/13/2023,2359 Performed at [facility], [address], [place], [place] [address]   I-Stat Chem 8, ED     Status: Abnormal   Collection Time: 03/10/23  9:51 AM  Result Value Ref Range   Sodium 138 135 -  145 mmol/L   Potassium 2.8 (L) 3.5 - 5.1 mmol/L   Chloride 106 98 - 111 mmol/L   BUN 61 (H) 8 - 23 mg/dL   Creatinine, Ser 3.08 (H) 0.61 - 1.24 mg/dL   Glucose, Bld 657 (H) 70 - 99 mg/dL   Calcium, Ion 8.46 (L) 1.15 - 1.40 mmol/L   TCO2 21 (L) 22 -  32 mmol/L   Hemoglobin 12.9 (L) 13.0 - 17.0 g/dL   HCT 95.6 (L) 21.3 - 08.6 %  I-Stat Lactic Acid, ED     Status: None   Collection Time: 03/10/23  9:52 AM  Result Value Ref Range   Lactic Acid, Venous 1.4 0.5 - 1.9 mmol/L  Troponin I (High Sensitivity)     Status: None   Collection Time: 03/10/23 11:37 AM  Result Value Ref Range   Troponin I (High Sensitivity) 13 <18 ng/L  TSH     Status: None   Collection Time: 03/10/23 11:37 AM  Result Value Ref Range   TSH 0.779 0.350 - 4.500 uIU/mL  C Difficile Quick Screen w PCR reflex     Status: None   Collection Time: 03/10/23 12:11 PM   Specimen: Stool  Result Value Ref Range   C Diff antigen NEGATIVE NEGATIVE   C Diff toxin NEGATIVE NEGATIVE   C Diff interpretation No C. difficile detected.   Urinalysis, Routine w reflex microscopic -Urine, Clean Catch     Status: None   Collection Time: 03/10/23 12:55 PM  Result Value Ref Range   Color, Urine YELLOW YELLOW   APPearance CLEAR CLEAR   Specific Gravity, Urine 1.021 1.005 - 1.030   pH 5.0 5.0 - 8.0   Glucose, UA NEGATIVE NEGATIVE mg/dL   Hgb urine dipstick NEGATIVE NEGATIVE   Bilirubin Urine NEGATIVE NEGATIVE   Ketones, ur NEGATIVE NEGATIVE mg/dL   Protein, ur NEGATIVE NEGATIVE mg/dL   Nitrite NEGATIVE NEGATIVE   Leukocytes,Ua NEGATIVE NEGATIVE  Potassium     Status: Abnormal   Collection Time: 03/10/23  7:09 PM  Result Value Ref Range   Potassium 2.5 (LL) 3.5 - 5.1 mmol/L   Recent Results (from the past 720 hours)  Gastrointestinal Panel by PCR , Stool     Status: Abnormal   Collection Time: 03/07/23  5:46 PM   Specimen: Stool  Result Value Ref Range Status   Campylobacter species NOT DETECTED NOT DETECTED Final   Plesimonas  shigelloides NOT DETECTED NOT DETECTED Final   Salmonella species NOT DETECTED NOT DETECTED Final   Yersinia enterocolitica NOT DETECTED NOT DETECTED Final   Vibrio species NOT DETECTED NOT DETECTED Final   Vibrio cholerae NOT DETECTED NOT DETECTED Final   Enteroaggregative E coli (EAEC) NOT DETECTED NOT DETECTED Final   Enteropathogenic E coli (EPEC) NOT DETECTED NOT DETECTED Final   Enterotoxigenic E coli (ETEC) NOT DETECTED NOT DETECTED Final   Shiga like toxin producing E coli (STEC) NOT DETECTED NOT DETECTED Final   Shigella/Enteroinvasive E coli (EIEC) NOT DETECTED NOT DETECTED Final   Cryptosporidium NOT DETECTED NOT DETECTED Final   Cyclospora cayetanensis NOT DETECTED NOT DETECTED Final   Entamoeba histolytica NOT DETECTED NOT DETECTED Final   Giardia lamblia NOT DETECTED NOT DETECTED Final   Adenovirus F40/41 NOT DETECTED NOT DETECTED Final   Astrovirus NOT DETECTED NOT DETECTED Final   Norovirus GI/GII DETECTED (A) NOT DETECTED Final    Comment: RESULT CALLED TO, READ BACK BY AND VERIFIED WITH: Eugenia Mcalpine RN 1404 03/09/23 HNM    Rotavirus A NOT DETECTED NOT DETECTED Final   Sapovirus (I, II, IV, and V) NOT DETECTED NOT DETECTED Final    Comment: Performed at [facility], [address] [place], [place] [address]  C Difficile Quick Screen w PCR reflex     Status: None   Collection Time: 03/07/23  5:46 PM   Specimen: Stool  Result Value Ref Range Status  C Diff antigen NEGATIVE NEGATIVE Final   C Diff toxin NEGATIVE NEGATIVE Final   C Diff interpretation No C. difficile detected.  Final    Comment: NEGATIVE Performed at [facility], [address], [place], [place] [address]   Resp panel by RT-PCR (RSV, Flu A&B, Covid) Anterior Nasal Swab     Status: None   Collection Time: 03/10/23  9:25 AM   Specimen: Anterior Nasal Swab  Result Value Ref Range Status   SARS Coronavirus 2 by RT PCR NEGATIVE NEGATIVE Final   Influenza A by PCR NEGATIVE  NEGATIVE Final   Influenza B by PCR NEGATIVE NEGATIVE Final    Comment: (NOTE) The Xpert Xpress SARS-CoV-2/FLU/RSV plus assay is intended as an aid in the diagnosis of influenza from Nasopharyngeal swab specimens and should not be used as a sole basis for treatment. Nasal washings and aspirates are unacceptable for Xpert Xpress SARS-CoV-2/FLU/RSV testing.  Fact Sheet for Patients: BloggerCourse.com  Fact Sheet for Healthcare Providers: SeriousBroker.it  This test is not yet approved or cleared by the [place] FDA and has been authorized for detection and/or diagnosis of SARS-CoV-2 by FDA under an Emergency Use Authorization (EUA). This EUA will remain in effect (meaning this test can be used) for the duration of the COVID-19 declaration under Section 564(b)(1) of the Act, 21 U.S.C. section 360bbb-3(b)(1), unless the authorization is terminated or revoked.     Resp Syncytial Virus by PCR NEGATIVE NEGATIVE Final    Comment: (NOTE) Fact Sheet for Patients: BloggerCourse.com  Fact Sheet for Healthcare Providers: SeriousBroker.it  This test is not yet approved or cleared by the [place] FDA and has been authorized for detection and/or diagnosis of SARS-CoV-2 by FDA under an Emergency Use Authorization (EUA). This EUA will remain in effect (meaning this test can be used) for the duration of the COVID-19 declaration under Section 564(b)(1) of the Act, 21 U.S.C. section 360bbb-3(b)(1), unless the authorization is terminated or revoked.  Performed at [facility], [address], [place], [place] [address]   C Difficile Quick Screen w PCR reflex     Status: None   Collection Time: 03/10/23 12:11 PM   Specimen: Stool  Result Value Ref Range Status   C Diff antigen NEGATIVE NEGATIVE Final   C Diff toxin NEGATIVE NEGATIVE Final   C Diff interpretation No C. difficile  detected.  Final    Comment: Performed at [facility], [address], [place], [place] [address]   CBC:    Latest Ref Rng & Units 03/10/2023    9:51 AM 03/10/2023    9:25 AM 03/07/2023    5:30 PM  CBC  WBC 4.0 - 10.5 K/uL  5.5  8.7   Hemoglobin 13.0 - 17.0 g/dL 91.4  78.2  95.6   Hematocrit 39.0 - 52.0 % 38.0  37.4  40.8   Platelets 150 - 400 K/uL  209  214    Basic Metabolic Panel: Recent Labs  Lab 03/07/23 1730 03/10/23 0925 03/10/23 0937 03/10/23 0951 03/10/23 1909  NA 133* 136  --  138  --   K 2.8* 2.5*  --  2.8* 2.5*  CL 101 103  --  106  --   CO2 21* 19*  --   --   --   GLUCOSE 162* 107*  --  103*  --   BUN 43* 65*  --  61*  --   CREATININE 1.29* 1.52*  --  1.60*  --  CALCIUM 8.7* 8.6*  --   --   --   MG  --   --  1.9  --   --    Creatinine: Lab Results  Component Value Date   CREATININE 1.60 (H) 03/10/2023   CREATININE 1.52 (H) 03/10/2023   CREATININE 1.29 (H) 03/07/2023   Liver Function Tests:    Latest Ref Rng & Units 03/10/2023    9:25 AM 03/07/2023    5:30 PM 01/28/2023    2:14 PM  Hepatic Function  Total Protein 6.5 - 8.1 g/dL 6.1  7.1  7.1   Albumin 3.5 - 5.0 g/dL 3.2  3.9  4.4   AST 15 - 41 U/L 23  27  15    ALT 0 - 44 U/L 16  15  9    Alk Phosphatase 38 - 126 U/L 42  56  70   Total Bilirubin 0.0 - 1.2 mg/dL 1.3  0.9  0.6   Bilirubin, Direct 0.0 - 0.3 mg/dL   0.2    Coagulation Profile: Recent Labs  Lab 03/10/23 0925  INR 1.3*   Cardiac Enzymes: No results for input(s): "CKTOTAL", "CKMB", "CKMBINDEX", "TROPONINI" in the last 168 hours. BNP (last 3 results) No results for input(s): "PROBNP" in the last 8760 hours. HbA1C: No results for input(s): "HGBA1C" in the last 72 hours. Lipid Profile: No results for input(s): "CHOL", "HDL", "LDLCALC", "TRIG", "CHOLHDL", "LDLDIRECT" in the last 72 hours.  Radiological Exams on Admission: MR CERVICAL SPINE WO CONTRAST Result Date: 03/10/2023 CLINICAL DATA:  Fall and weakness.  Possible infection.  EXAM: MRI CERVICAL SPINE WITHOUT CONTRAST TECHNIQUE: Multiplanar, multisequence MR imaging of the cervical spine was performed. No intravenous contrast was administered. COMPARISON:  Cervical spine CT 03/10/2023 FINDINGS: Alignment: Trace anterolisthesis of C2 on C3, C7 on T1, and T1 on T2 and trace retrolisthesis of C3 on C4. Vertebrae: No fracture. Asymmetrically severe right lateral C1-2 arthropathy as shown on CT with a small joint effusion and moderate marrow edema extending into the dens. Erosions of the right aspect of the dens as shown on CT. Mild ligamentous thickening about the dens. No epidural fluid collection. Cord: Normal signal. Posterior Fossa, vertebral arteries, paraspinal tissues: No more than mild edema in the right-sided prevertebral soft tissues at C1-2. No fluid collection. Unremarkable included posterior fossa. Preserved vertebral artery flow voids with the right vertebral artery being dominant. Disc levels: Moderate disc space narrowing at C2-3 and C3-4 and severe narrowing from C4-5 through C6-7. C2-3: Anterolisthesis with mild bulging of uncovered disc, uncovertebral spurring, and severe right and mild left facet arthrosis result in mild spinal stenosis and mild right neural foraminal stenosis. C3-4: Disc bulging, uncovertebral spurring, and mild right and severe left facet arthrosis result in mild spinal stenosis and severe bilateral neural foraminal stenosis. C4-5: Interbody and right facet ankylosis. Osteophytic ridging results in mild spinal stenosis and moderate right and mild left neural foraminal stenosis. C5-6: A broad-based posterior disc osteophyte complex and mild right and moderate left facet arthrosis result in mild-to-moderate spinal stenosis and severe right greater than left neural foraminal stenosis. C6-7: A broad-based posterior disc osteophyte complex and moderate facet arthrosis result in mild spinal stenosis and mild bilateral neural foraminal stenosis. C7-T1:  Anterolisthesis with disc uncovering and moderate facet arthrosis. No significant stenosis. IMPRESSION: 1. Severe right lateral C1-2 arthropathy with moderate marrow edema and erosions of the dens, favored to be inflammatory. No findings strongly suggestive of acute infection. 2. Advanced cervical disc and facet degeneration with mild-to-moderate  spinal stenosis at C5-6 and mild spinal stenosis at multiple other levels. 3. Severe bilateral neural foraminal stenosis at C3-4 and C5-6. Electronically Signed   By: Sebastian Ache M.D.   On: 03/10/2023 18:15   CT HEAD WO CONTRAST Result Date: 03/10/2023 CLINICAL DATA:  Level 2 trauma, fall on anticoagulation. EXAM: CT HEAD WITHOUT CONTRAST CT CERVICAL SPINE WITHOUT CONTRAST TECHNIQUE: Multidetector CT imaging of the head and cervical spine was performed following the standard protocol without intravenous contrast. Multiplanar CT image reconstructions of the cervical spine were also generated. RADIATION DOSE REDUCTION: This exam was performed according to the departmental dose-optimization program which includes automated exposure control, adjustment of the mA and/or kV according to patient size and/or use of iterative reconstruction technique. COMPARISON:  CT head 08/06/2022 FINDINGS: CT HEAD FINDINGS Brain: No acute intracranial hemorrhage. No CT evidence of acute infarct. Nonspecific hypoattenuation in the periventricular and subcortical white matter favored to reflect chronic microvascular ischemic changes. 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 No edema, mass effect, or midline shift. The basilar cisterns are patent. Ventricles: Prominence of the ventricles suggesting underlying parenchymal volume loss. Vascular: Atherosclerotic calcifications of the carotid siphons and intracranial vertebral arteries. No hyperdense vessel. Skull: No acute or aggressive finding. Similar appearance of arachnoid granulations including large arachnoid granulation  involving the right occipital bone. Orbits: Orbits are symmetric. Sinuses: The visualized paranasal sinuses are clear. Other: Mastoid air cells are clear. CT CERVICAL SPINE FINDINGS Alignment: Straightening of the normal cervical lordosis. Trace anterolisthesis of C2 on C3 and of C7 on T1 and T1 on T2. Trace retrolisthesis of C3 on C4. No facet subluxation or dislocation. Skull base and vertebrae: There are prominent lucencies involving the right aspect of the dens. Additional lucencies within the right lateral mass of C1 and the right anterior arch of C1 adjacent to the region of lucency in the dens. Vertebral body heights are maintained. No evidence of compression fracture. Schmorl's node involving the T1 inferior endplate. Soft tissues and spinal canal: Soft tissue along the dorsal aspect of the dens suggestive of degenerative pannus. Additional soft tissue along the right aspect of the dens with threes mineralization which results in asymmetric narrowing of the spinal canal. Possible thickening of the transverse ligament. There is no evidence of spinal canal hematoma. The paraspinal musculature is unremarkable. Retropharyngeal course of the left carotid artery which indents the posterior or pharyngeal airway. Disc levels: Severe asymmetric degenerative changes at the right atlantoaxial articulation with areas of sclerosis and subchondral lucency. Severe disc space narrowing at C4-5 through C6-7. Additional moderate disc space narrowing at C2-3 and C3-4. Disc osteophyte complexes at multiple levels throughout the cervical spine. There is mild osseous spinal canal narrowing at C3-4 through C5-6. Facet arthrosis at multiple levels. Foraminal narrowing appears most pronounced on the left at C3-4. Upper chest: Biapical pleuroparenchymal scarring. Other: None. IMPRESSION: CT HEAD: 1. No CT evidence of acute intracranial abnormality. 2. Mild chronic microvascular ischemic changes and parenchymal volume loss. 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 CT CERVICAL SPINE: 1. No evidence of acute fracture or traumatic malalignment. 2. Lucency of the right aspect of the dens as well as the right lateral mass of C1. There are asymmetric severe degenerative changes of the adjacent right atlantoaxial articulation. Findings could reflect a chronic inflammatory arthropathy versus septic arthritis. Recommend contrast enhanced MRI cervical spine for further evaluation. 3. Soft tissue along the dorsal aspect of the dens and extending between the dens and right lateral  mass of C1. Findings could reflect asymmetric degenerative pannus versus infection/phlegmon. 4. Additional degenerative changes as above. Electronically Signed   By: Emily Filbert M.D.   On: 03/10/2023 11:13   CT CERVICAL SPINE WO CONTRAST Result Date: 03/10/2023 CLINICAL DATA:  Level 2 trauma, fall on anticoagulation. EXAM: CT HEAD WITHOUT CONTRAST CT CERVICAL SPINE WITHOUT CONTRAST TECHNIQUE: Multidetector CT imaging of the head and cervical spine was performed following the standard protocol without intravenous contrast. Multiplanar CT image reconstructions of the cervical spine were also generated. RADIATION DOSE REDUCTION: This exam was performed according to the departmental dose-optimization program which includes automated exposure control, adjustment of the mA and/or kV according to patient size and/or use of iterative reconstruction technique. COMPARISON:  CT head 08/06/2022 FINDINGS: CT HEAD FINDINGS Brain: No acute intracranial hemorrhage. No CT evidence of acute infarct. Nonspecific hypoattenuation in the periventricular and subcortical white matter favored to reflect chronic microvascular ischemic changes. 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 No edema, mass effect, or midline shift. The basilar cisterns are patent. Ventricles: Prominence of the ventricles suggesting underlying parenchymal volume loss. Vascular: Atherosclerotic  calcifications of the carotid siphons and intracranial vertebral arteries. No hyperdense vessel. Skull: No acute or aggressive finding. Similar appearance of arachnoid granulations including large arachnoid granulation involving the right occipital bone. Orbits: Orbits are symmetric. Sinuses: The visualized paranasal sinuses are clear. Other: Mastoid air cells are clear. CT CERVICAL SPINE FINDINGS Alignment: Straightening of the normal cervical lordosis. Trace anterolisthesis of C2 on C3 and of C7 on T1 and T1 on T2. Trace retrolisthesis of C3 on C4. No facet subluxation or dislocation. Skull base and vertebrae: There are prominent lucencies involving the right aspect of the dens. Additional lucencies within the right lateral mass of C1 and the right anterior arch of C1 adjacent to the region of lucency in the dens. Vertebral body heights are maintained. No evidence of compression fracture. Schmorl's node involving the T1 inferior endplate. Soft tissues and spinal canal: Soft tissue along the dorsal aspect of the dens suggestive of degenerative pannus. Additional soft tissue along the right aspect of the dens with threes mineralization which results in asymmetric narrowing of the spinal canal. Possible thickening of the transverse ligament. There is no evidence of spinal canal hematoma. The paraspinal musculature is unremarkable. Retropharyngeal course of the left carotid artery which indents the posterior or pharyngeal airway. Disc levels: Severe asymmetric degenerative changes at the right atlantoaxial articulation with areas of sclerosis and subchondral lucency. Severe disc space narrowing at C4-5 through C6-7. Additional moderate disc space narrowing at C2-3 and C3-4. Disc osteophyte complexes at multiple levels throughout the cervical spine. There is mild osseous spinal canal narrowing at C3-4 through C5-6. Facet arthrosis at multiple levels. Foraminal narrowing appears most pronounced on the left at C3-4.  Upper chest: Biapical pleuroparenchymal scarring. Other: None. IMPRESSION: CT HEAD: 1. No CT evidence of acute intracranial abnormality. 2. Mild chronic microvascular ischemic changes and parenchymal volume loss. 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 CT CERVICAL SPINE: 1. No evidence of acute fracture or traumatic malalignment. 2. Lucency of the right aspect of the dens as well as the right lateral mass of C1. There are asymmetric severe degenerative changes of the adjacent right atlantoaxial articulation. Findings could reflect a chronic inflammatory arthropathy versus septic arthritis. Recommend contrast enhanced MRI cervical spine for further evaluation. 3. Soft tissue along the dorsal aspect of the dens and extending between the dens and right lateral mass of C1. Findings  could reflect asymmetric degenerative pannus versus infection/phlegmon. 4. Additional degenerative changes as above. Electronically Signed   By: Emily Filbert M.D.   On: 03/10/2023 11:13   DG Chest Port 1 View Result Date: 03/10/2023 CLINICAL DATA:  Trauma patient.  Status post fall. EXAM: PORTABLE CHEST 1 VIEW COMPARISON:  Radiographs 08/06/2022 and 05/28/2009. FINDINGS: 0930 hours. The heart size and mediastinal contours are stable without evidence of mediastinal hematoma. The lungs are clear. There is no pleural effusion or pneumothorax. No acute fractures are identified. There are advanced glenohumeral degenerative changes bilaterally. Telemetry leads overlie the chest. IMPRESSION: No evidence of acute chest injury. Advanced glenohumeral degenerative changes bilaterally. Electronically Signed   By: Carey Bullocks M.D.   On: 03/10/2023 10:44    Data Reviewed: Relevant notes from primary care and specialist visits, past discharge summaries as available in EHR, including Care Everywhere. Prior diagnostic testing as pertinent to current admission diagnoses, Updated medications and problem lists for reconciliation ED  course, including vitals, labs, imaging, treatment and response to treatment,Triage notes, nursing and pharmacy notes and ED provider's notes Notable results as noted in HPI.Discussed case with EDMD/ ED APP/ or Specialty MD on call and as needed.  >>Assessment and Plan: * Syncope and collapse Patient admitted for syncope and collapse and fall while trying to go to the bathroom, attribute to patient being dehydrated and hypotensive from diarrhea where he was diagnosed with norovirus about 3 days ago.  No recent antibiotic use. Fall precautions, orthostatic vitals.  GERD IV PPI therapy.  Aspiration precaution.  Chronic atrial fibrillation (HCC) Pt in a.fib and eliquis continued.  BP meds held and we will resume in am.   BPH (benign prostatic hyperplasia) Continue patient on his Proscar 5 mg.  Diarrhea 2/2 to norovirus. We will however get c.diff/ culture.  Enteric isolation.   AKI (acute kidney injury) (HCC) Lab Results  Component Value Date   CREATININE 1.60 (H) 03/10/2023   CREATININE 1.52 (H) 03/10/2023   CREATININE 1.29 (H) 03/07/2023  Hold BP meds, renally dose meds and avoid contrast.    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  No alarm symptoms / or incontinence or fall or paresthesia.    Electrolyte abnormality Hypokalemia from GI losses.  Replace and follow levels.     DVT prophylaxis:  Eliquis  Consults:  None  Advance Care Planning:    Code Status: Full Code   Family Communication:  None . Disposition Plan:  SNF Severity of Illness: The appropriate patient status for this patient is OBSERVATION. Observation status is judged to be reasonable and necessary in order to provide the required intensity of service to ensure the patient's safety. The patient's presenting symptoms, physical exam findings, and initial radiographic and laboratory data in the context of their  medical condition is felt to place them at decreased risk for further clinical deterioration. Furthermore, it is anticipated that the patient will be medically stable for discharge from the hospital within 2 midnights of admission.   Author: Gertha Calkin, MD 03/10/2023 8:16 PM  For on call review [email or web address].   Unresulted Labs (From admission, onward)     Start     Ordered   03/11/23 0500  Comprehensive metabolic panel  Tomorrow morning,   R        03/10/23 1221   03/11/23 0500  CBC  Tomorrow morning,   R  03/10/23 1221   03/11/23 0500  Magnesium  Tomorrow morning,   R        03/10/23 1222   03/10/23 1211  Gastrointestinal Panel by PCR , Stool  (Gastrointestinal Panel by PCR, Stool                                                                                                                                                     **Does Not include CLOSTRIDIUM DIFFICILE testing. **If CDIFF testing is needed, place order from the "C Difficile Testing" order set.**)  Once,   URGENT        03/10/23 1210   Unscheduled  Occult blood card to lab, stool  As needed,   R      03/10/23 1210            Orders Placed This Encounter  Procedures   Critical Care   Resp panel by RT-PCR (RSV, Flu A&B, Covid) Anterior Nasal Swab   Gastrointestinal Panel by PCR , Stool   C Difficile Quick Screen w PCR reflex   DG Chest Port 1 View   CT HEAD WO CONTRAST   CT CERVICAL SPINE WO CONTRAST   MR CERVICAL SPINE WO CONTRAST   Comprehensive metabolic panel   CBC   Ethanol   Urinalysis, Routine w reflex microscopic -Urine, Clean Catch   Protime-INR   Magnesium   Occult blood card to lab, stool   Comprehensive metabolic panel   CBC   TSH   Magnesium   Potassium   Diet full liquid Room service appropriate? Yes; Fluid consistency: Thin   Measure blood pressure   Initiate Carrier Fluid Protocol   Maintain IV access   Vital signs   Notify physician (specify)   Mobility Protocol: No  Restrictions RN to initiate protocols based on patient's level of care   Refer to Sidebar Report Refer to ICU, Med-Surg, Progressive, and Step-Down Mobility Protocol Sidebars   Initiate Adult Central Line Maintenance and Catheter Protocol for patients with central line (CVC, PICC, Port, Hemodialysis, Trialysis)   Daily weights   Intake and Output   Do not place and if present remove PureWick   Initiate Oral Care Protocol   Initiate Carrier Fluid Protocol   RN may order General Admission PRN Orders utilizing "General Admission PRN medications" (through manage orders) for the following patient needs: allergy symptoms (Claritin), cold sores (Carmex), cough (Robitussin DM), eye irritation (Liquifilm Tears), hemorrhoids (Tucks), indigestion (Maalox), minor skin irritation (Hydrocortisone Cream), muscle pain Romeo Apple Gay), nose irritation (saline nasal spray) and sore throat (Chloraseptic spray).   Cardiac Monitoring - Continuous Indefinite   Bladder scan   Check for fecal impaction; check bowel movement record; assess for pain.   Orthostatic vital signs   Full code   Consult to hospitalist   monitoring by pharmacy   Enteric  precautions (UV disinfection)   Pulse oximetry check with vital signs   Oxygen therapy Mode or (Route): Nasal cannula; Liters Per Minute: 2; Keep O2 saturation between: greater than 92 %   I-Stat Chem 8, ED   I-Stat Lactic Acid, ED   EKG 12-Lead   EKG 12-Lead   Sample to Blood Bank   Place in observation (patient's expected length of stay will be less than 2 midnights)   Aspiration precautions   Fall precautions

## 2023-03-10 NOTE — ED Notes (Signed)
 Used bed side toilet. Patient had a small BM, BM was watery

## 2023-03-10 NOTE — Assessment & Plan Note (Signed)
 Continue patient on his Proscar 5 mg.

## 2023-03-10 NOTE — ED Provider Notes (Signed)
 [place] EMERGENCY DEPARTMENT AT [facility] Provider Note   CSN: [id number] Arrival date & time: 03/10/23  4098     History  Chief Complaint  Patient presents with   fall on thinners    Phillip Shelton is a 89 y.o. male with a history of A-fib on Eliquis, presenting from [facility] facility with concern for weakness and a fall.  Patient reports that he got up to use the bathroom in the evening and became lightheaded and fell and struck his head on the wall.  He denies significant loss of consciousness or prolonged downtime, states that he had to sit in the bathroom for about 45 minutes before someone came to check in on him and called an ambulance.  He says he has felt very weak for the past several days.  He notes that he was seen in the ED approximately 3 days ago, which time he was having diarrhea, and had a metabolic and dehydration workup performed, and was discharged back to his facility.  He reports that he is not any longer having diarrhea, but has had a poor appetite, not eating or drinking much, feels that he will get nauseated if he does.  He denies to me that he currently has chest pain or headache.  He denies any hip pain or pelvic pain.  Patient reports that his daughter brought him 3 doses of Kaopectate yesterday evening for upset stomach and he took another dose this morning.  He says that completely resolved his upset stomach and he no longer has abdominal pain.  HPI     Home Medications Prior to Admission medications   Medication Sig Start Date End Date Taking? Authorizing Provider  acetaminophen (TYLENOL) 500 MG tablet Take 1,000 mg by mouth 3 (three) times daily.   Yes [provider]  apixaban (ELIQUIS) 5 MG TABS tablet Take 1 tablet (5 mg total) by mouth 2 (two) times daily. 08/14/22  Yes Jerald Kief, MD  cyanocobalamin (VITAMIN B12) 1000 MCG tablet Take 1,000 mcg by mouth daily.   Yes [provider]  diclofenac  Sodium (VOLTAREN) 1 % GEL Apply 4 g topically 4 (four) times daily. Patient taking differently: Apply 4 g topically in the morning, at noon, in the evening, and at bedtime. Both legs 09/13/22  Yes Corwin Levins, MD  finasteride (PROSCAR) 5 MG tablet Take 1 tablet (5 mg total) by mouth daily. Patient taking differently: Take 5 mg by mouth at bedtime. 12/17/21  Yes Corwin Levins, MD  fish oil-omega-3 fatty acids 1000 MG capsule Take 1 capsule by mouth at bedtime.   Yes [provider]  hydrochlorothiazide (MICROZIDE) 12.5 MG capsule TAKE ONE CAPSULE BY MOUTH EVERY DAY FOR FLUID Patient taking differently: Take 12.5 mg by mouth daily. 02/14/23  Yes Corwin Levins, MD  metoprolol tartrate (LOPRESSOR) 25 MG tablet Take 0.5 tablets (12.5 mg total) by mouth 2 (two) times daily. 08/14/22 03/10/23 Yes Jerald Kief, MD  mirtazapine (REMERON) 7.5 MG tablet Take 1 tablet (7.5 mg total) by mouth at bedtime. 01/28/23  Yes Corwin Levins, MD  Multiple Vitamins-Minerals (MULTIVITAMIN ADULTS PO) Take 1 tablet by mouth daily with breakfast.   Yes [provider]  omeprazole (PRILOSEC) 20 MG capsule Take 1 capsule (20 mg total) by mouth daily. Patient taking differently: Take 20 mg by mouth daily before breakfast. 06/17/22  Yes Doree Albee, PA-C  tamsulosin (FLOMAX) 0.4 MG CAPS capsule Take 1 capsule (0.4  mg total) by mouth daily after supper. 08/14/22  Yes Jerald Kief, MD  temazepam (RESTORIL) 15 MG capsule TAKE 1 CAPSULE BY MOUTH AT BEDTIME AS NEEDED FOR SLEEP. Patient taking differently: Take 15 mg by mouth at bedtime as needed for sleep. 09/13/22  Yes Corwin Levins, MD  potassium chloride SA (KLOR-CON M) 20 MEQ tablet Take 1 tablet (20 mEq total) by mouth daily. Patient not taking: Reported on 03/10/2023 03/07/23   Elayne Snare K, DO      Allergies    Ambien [zolpidem] and Atorvastatin    Review of Systems   Review of Systems  Physical Exam Updated Vital Signs BP 118/79   Pulse 83    Temp 98.6 F (37 C) (Oral)   Resp 17   Ht 6' (1.829 m)   Wt 83.9 kg   SpO2 98%   BMI 25.09 kg/m  Physical Exam Constitutional:      General: He is not in acute distress. HENT:     Head: Normocephalic and atraumatic.  Eyes:     Conjunctiva/sclera: Conjunctivae normal.     Pupils: Pupils are equal, round, and reactive to light.  Cardiovascular:     Rate and Rhythm: Tachycardia present. Rhythm irregular.  Pulmonary:     Effort: Pulmonary effort is normal. No respiratory distress.  Abdominal:     General: There is no distension.     Tenderness: There is no abdominal tenderness.  Musculoskeletal:     Comments: Region to the right elbow, full range of motion of the lower extremities and upper extremities with no visible deformity  Skin:    General: Skin is warm and dry.  Neurological:     General: No focal deficit present.     Mental Status: He is alert. Mental status is at baseline.  Psychiatric:        Mood and Affect: Mood normal.        Behavior: Behavior normal.     ED Results / Procedures / Treatments   Labs (all labs ordered are listed, but only abnormal results are displayed) Labs Reviewed  COMPREHENSIVE METABOLIC PANEL - Abnormal; Notable for the following components:      Result Value   Potassium 2.5 (*)    CO2 19 (*)    Glucose, Bld 107 (*)    BUN 65 (*)    Creatinine, Ser 1.52 (*)    Calcium 8.6 (*)    Total Protein 6.1 (*)    Albumin 3.2 (*)    Total Bilirubin 1.3 (*)    GFR, Estimated 44 (*)    All other components within normal limits  CBC - Abnormal; Notable for the following components:   RBC 4.18 (*)    HCT 37.4 (*)    All other components within normal limits  PROTIME-INR - Abnormal; Notable for the following components:   Prothrombin Time 16.2 (*)    INR 1.3 (*)    All other components within normal limits  I-STAT CHEM 8, ED - Abnormal; Notable for the following components:   Potassium 2.8 (*)    BUN 61 (*)    Creatinine, Ser 1.60 (*)     Glucose, Bld 103 (*)    Calcium, Ion 1.09 (*)    TCO2 21 (*)    Hemoglobin 12.9 (*)    HCT 38.0 (*)    All other components within normal limits  RESP PANEL BY RT-PCR (RSV, FLU A&B, COVID)  RVPGX2  GASTROINTESTINAL PANEL BY PCR, STOOL (REPLACES  STOOL CULTURE)  C DIFFICILE QUICK SCREEN W PCR REFLEX    ETHANOL  MAGNESIUM  URINALYSIS, ROUTINE W REFLEX MICROSCOPIC  TSH  I-STAT CG4 LACTIC ACID, ED  SAMPLE TO BLOOD BANK  TROPONIN I (HIGH SENSITIVITY)  TROPONIN I (HIGH SENSITIVITY)    EKG EKG Interpretation Date/Time:  Thursday March 10 2023 10:14:33 EST Ventricular Rate:  92 PR Interval:    QRS Duration:  108 QT Interval:  451 QTC Calculation: 558 R Axis:   14  Text Interpretation: Atrial fibrillation Low voltage, precordial leads Borderline repolarization abnormality Prolonged QT interval Confirmed by Alvester Chou [id number]) on 03/10/2023 10:19:28 AM  Radiology CT HEAD WO CONTRAST Result Date: 03/10/2023 CLINICAL DATA:  Level 2 trauma, fall on anticoagulation. EXAM: CT HEAD WITHOUT CONTRAST CT CERVICAL SPINE WITHOUT CONTRAST TECHNIQUE: Multidetector CT imaging of the head and cervical spine was performed following the standard protocol without intravenous contrast. Multiplanar CT image reconstructions of the cervical spine were also generated. RADIATION DOSE REDUCTION: This exam was performed according to the departmental dose-optimization program which includes automated exposure control, adjustment of the mA and/or kV according to patient size and/or use of iterative reconstruction technique. COMPARISON:  CT head 08/06/2022 FINDINGS: CT HEAD FINDINGS Brain: No acute intracranial hemorrhage. No CT evidence of acute infarct. Nonspecific hypoattenuation in the periventricular and subcortical white matter favored to reflect chronic microvascular ischemic changes. 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 No edema, mass effect, or midline shift. The basilar cisterns are patent.  Ventricles: Prominence of the ventricles suggesting underlying parenchymal volume loss. Vascular: Atherosclerotic calcifications of the carotid siphons and intracranial vertebral arteries. No hyperdense vessel. Skull: No acute or aggressive finding. Similar appearance of arachnoid granulations including large arachnoid granulation involving the right occipital bone. Orbits: Orbits are symmetric. Sinuses: The visualized paranasal sinuses are clear. Other: Mastoid air cells are clear. CT CERVICAL SPINE FINDINGS Alignment: Straightening of the normal cervical lordosis. Trace anterolisthesis of C2 on C3 and of C7 on T1 and T1 on T2. Trace retrolisthesis of C3 on C4. No facet subluxation or dislocation. Skull base and vertebrae: There are prominent lucencies involving the right aspect of the dens. Additional lucencies within the right lateral mass of C1 and the right anterior arch of C1 adjacent to the region of lucency in the dens. Vertebral body heights are maintained. No evidence of compression fracture. Schmorl's node involving the T1 inferior endplate. Soft tissues and spinal canal: Soft tissue along the dorsal aspect of the dens suggestive of degenerative pannus. Additional soft tissue along the right aspect of the dens with threes mineralization which results in asymmetric narrowing of the spinal canal. Possible thickening of the transverse ligament. There is no evidence of spinal canal hematoma. The paraspinal musculature is unremarkable. Retropharyngeal course of the left carotid artery which indents the posterior or pharyngeal airway. Disc levels: Severe asymmetric degenerative changes at the right atlantoaxial articulation with areas of sclerosis and subchondral lucency. Severe disc space narrowing at C4-5 through C6-7. Additional moderate disc space narrowing at C2-3 and C3-4. Disc osteophyte complexes at multiple levels throughout the cervical spine. There is mild osseous spinal canal narrowing at C3-4  through C5-6. Facet arthrosis at multiple levels. Foraminal narrowing appears most pronounced on the left at C3-4. Upper chest: Biapical pleuroparenchymal scarring. Other: None. IMPRESSION: CT HEAD: 1. No CT evidence of acute intracranial abnormality. 2. Mild chronic microvascular ischemic changes and parenchymal volume loss. 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 CT CERVICAL SPINE: 1. No evidence of  acute fracture or traumatic malalignment. 2. Lucency of the right aspect of the dens as well as the right lateral mass of C1. There are asymmetric severe degenerative changes of the adjacent right atlantoaxial articulation. Findings could reflect a chronic inflammatory arthropathy versus septic arthritis. Recommend contrast enhanced MRI cervical spine for further evaluation. 3. Soft tissue along the dorsal aspect of the dens and extending between the dens and right lateral mass of C1. Findings could reflect asymmetric degenerative pannus versus infection/phlegmon. 4. Additional degenerative changes as above. Electronically Signed   By: Emily Filbert M.D.   On: 03/10/2023 11:13   CT CERVICAL SPINE WO CONTRAST Result Date: 03/10/2023 CLINICAL DATA:  Level 2 trauma, fall on anticoagulation. EXAM: CT HEAD WITHOUT CONTRAST CT CERVICAL SPINE WITHOUT CONTRAST TECHNIQUE: Multidetector CT imaging of the head and cervical spine was performed following the standard protocol without intravenous contrast. Multiplanar CT image reconstructions of the cervical spine were also generated. RADIATION DOSE REDUCTION: This exam was performed according to the departmental dose-optimization program which includes automated exposure control, adjustment of the mA and/or kV according to patient size and/or use of iterative reconstruction technique. COMPARISON:  CT head 08/06/2022 FINDINGS: CT HEAD FINDINGS Brain: No acute intracranial hemorrhage. No CT evidence of acute infarct. Nonspecific hypoattenuation in the  periventricular and subcortical white matter favored to reflect chronic microvascular ischemic changes. 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 No edema, mass effect, or midline shift. The basilar cisterns are patent. Ventricles: Prominence of the ventricles suggesting underlying parenchymal volume loss. Vascular: Atherosclerotic calcifications of the carotid siphons and intracranial vertebral arteries. No hyperdense vessel. Skull: No acute or aggressive finding. Similar appearance of arachnoid granulations including large arachnoid granulation involving the right occipital bone. Orbits: Orbits are symmetric. Sinuses: The visualized paranasal sinuses are clear. Other: Mastoid air cells are clear. CT CERVICAL SPINE FINDINGS Alignment: Straightening of the normal cervical lordosis. Trace anterolisthesis of C2 on C3 and of C7 on T1 and T1 on T2. Trace retrolisthesis of C3 on C4. No facet subluxation or dislocation. Skull base and vertebrae: There are prominent lucencies involving the right aspect of the dens. Additional lucencies within the right lateral mass of C1 and the right anterior arch of C1 adjacent to the region of lucency in the dens. Vertebral body heights are maintained. No evidence of compression fracture. Schmorl's node involving the T1 inferior endplate. Soft tissues and spinal canal: Soft tissue along the dorsal aspect of the dens suggestive of degenerative pannus. Additional soft tissue along the right aspect of the dens with threes mineralization which results in asymmetric narrowing of the spinal canal. Possible thickening of the transverse ligament. There is no evidence of spinal canal hematoma. The paraspinal musculature is unremarkable. Retropharyngeal course of the left carotid artery which indents the posterior or pharyngeal airway. Disc levels: Severe asymmetric degenerative changes at the right atlantoaxial articulation with areas of sclerosis and subchondral lucency. Severe  disc space narrowing at C4-5 through C6-7. Additional moderate disc space narrowing at C2-3 and C3-4. Disc osteophyte complexes at multiple levels throughout the cervical spine. There is mild osseous spinal canal narrowing at C3-4 through C5-6. Facet arthrosis at multiple levels. Foraminal narrowing appears most pronounced on the left at C3-4. Upper chest: Biapical pleuroparenchymal scarring. Other: None. IMPRESSION: CT HEAD: 1. No CT evidence of acute intracranial abnormality. 2. Mild chronic microvascular ischemic changes and parenchymal volume loss. 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 CT CERVICAL SPINE: 1. No evidence of acute fracture or traumatic  malalignment. 2. Lucency of the right aspect of the dens as well as the right lateral mass of C1. There are asymmetric severe degenerative changes of the adjacent right atlantoaxial articulation. Findings could reflect a chronic inflammatory arthropathy versus septic arthritis. Recommend contrast enhanced MRI cervical spine for further evaluation. 3. Soft tissue along the dorsal aspect of the dens and extending between the dens and right lateral mass of C1. Findings could reflect asymmetric degenerative pannus versus infection/phlegmon. 4. Additional degenerative changes as above. Electronically Signed   By: Emily Filbert M.D.   On: 03/10/2023 11:13   DG Chest Port 1 View Result Date: 03/10/2023 CLINICAL DATA:  Trauma patient.  Status post fall. EXAM: PORTABLE CHEST 1 VIEW COMPARISON:  Radiographs 08/06/2022 and 05/28/2009. FINDINGS: 0930 hours. The heart size and mediastinal contours are stable without evidence of mediastinal hematoma. The lungs are clear. There is no pleural effusion or pneumothorax. No acute fractures are identified. There are advanced glenohumeral degenerative changes bilaterally. Telemetry leads overlie the chest. IMPRESSION: No evidence of acute chest injury. Advanced glenohumeral degenerative changes bilaterally.  Electronically Signed   By: Carey Bullocks M.D.   On: 03/10/2023 10:44    Procedures .Critical Care  Performed by: Terald Sleeper, MD Authorized by: Terald Sleeper, MD   Critical care provider statement:    Critical care time (minutes):  30   Critical care time was exclusive of:  Separately billable procedures and treating other patients   Critical care was necessary to treat or prevent imminent or life-threatening deterioration of the following conditions:  Metabolic crisis   Critical care was time spent personally by me on the following activities:  Ordering and performing treatments and interventions, ordering and review of laboratory studies, ordering and review of radiographic studies, pulse oximetry, review of old charts, examination of patient and evaluation of patient's response to treatment   Care discussed with: admitting provider   Comments:     Hypokalemia management     Medications Ordered in ED Medications  potassium chloride 10 mEq in 100 mL IVPB (10 mEq Intravenous New Bag/Given 03/10/23 1150)  sodium chloride flush (NS) 0.9 % injection 3 mL (has no administration in time range)  acetaminophen (TYLENOL) tablet 650 mg (has no administration in time range)    Or  acetaminophen (TYLENOL) suppository 650 mg (has no administration in time range)  HYDROcodone-acetaminophen (NORCO/VICODIN) 5-325 MG per tablet 1 tablet (has no administration in time range)  morphine (PF) 2 MG/ML injection 2 mg (has no administration in time range)  dextrose 5 %-0.9 % sodium chloride infusion (has no administration in time range)  pantoprazole (PROTONIX) injection 40 mg (has no administration in time range)  finasteride (PROSCAR) tablet 5 mg (has no administration in time range)  sodium chloride 0.9 % bolus 1,000 mL (1,000 mLs Intravenous New Bag/Given 03/10/23 1019)  sodium chloride 0.9 % bolus 1,000 mL (1,000 mLs Intravenous New Bag/Given 03/10/23 1019)  potassium chloride SA (KLOR-CON M)  CR tablet 20 mEq (20 mEq Oral Given 03/10/23 1145)    ED Course/ Medical Decision Making/ A&P Clinical Course as of 03/10/23 1306  Thu Mar 10, 2023  1122 CT imaging reviewed, no acute intracranial bleed.  There was a question of possible "septic arthritis" of the cervical spine but the patient does not have any neck pain to correlate with this and I do not believe he is requiring a further emergent workup at this time for this issue [MT]    Clinical Course User Index [  MT] Terald Sleeper, MD                                 Medical Decision Making Amount and/or Complexity of Data Reviewed Labs: ordered. Radiology: ordered. ECG/medicine tests: ordered.  Risk Prescription drug management. Decision regarding hospitalization.   This patient presents to the ED with concern for fall, head injury, generalized weakness. This involves an extensive number of treatment options, and is a complaint that carries with it a high risk of complications and morbidity.  The differential diagnosis includes Ortho static hypotension versus metabolic derangement versus dehydration versus other  Co-morbidities that complicate the patient evaluation: Patient is on Eliquis, high risk for traumatic bleeding after head injury  Additional history obtained from EMS   I ordered and personally interpreted labs.  The pertinent results include: Developing AKI with creatinine at 1.6, up from 0.57-month ago.  BUN is also elevated suggestive of some prerenal component or dehydration.  Potassium is lowered hypokalemic at 2.5.  White blood cell count and hemoglobin are normal.  I ordered imaging studies including CT head and cervical spine, x-ray of the chest I independently visualized and interpreted imaging which showed no emergent findings, incidental findings as noted in ED course below, below suspicion for septic joint or arthritis of the neck I agree with the radiologist interpretation  The patient was maintained  on a cardiac monitor.  I personally viewed and interpreted the cardiac monitored which showed an underlying rhythm of: A-fib with heart rate 100-105 bpm average  Per my interpretation the patient's ECG shows atrial fibrillation without acute ischemia  I ordered medication including fluid bolus for hydration IV fluids, IV K and oral K  I have reviewed the patients home medicines and have made adjustments as needed  Test Considered: Low suspicion for pelvic fracture.  Low suspicion for acute PE or AAA or mesenteric ischemia.   After the interventions noted above, I reevaluated the patient and found that they have: stayed the same  Disposition:  After consideration of the diagnostic results and the patients response to treatment, I feel that the patent would benefit from medical admission now given concerns for developing dehydration as well as worsening hypokalemia.         Final Clinical Impression(s) / ED Diagnoses Final diagnoses:  Hypokalemia  Near syncope  AKI (acute kidney injury) [facility])    Rx / DC Orders ED Discharge Orders     None         Erinne Gillentine, Kermit Balo, MD 03/10/23 1306

## 2023-03-10 NOTE — Progress Notes (Signed)
 Orthopedic Tech Progress Note Patient Details:  Phillip Shelton 30-Jul-1934 [id number] Level 2 Trauma. Not needed Patient ID: MANDO BLATZ, male   DOB: Sep 17, 1934, 89 y.o.   MRN: [id number]  Lovett Calender 03/10/2023, 9:26 AM

## 2023-03-10 NOTE — ED Notes (Signed)
 Patient transported to CT by this RN

## 2023-03-11 DIAGNOSIS — N401 Enlarged prostate with lower urinary tract symptoms: Secondary | ICD-10-CM

## 2023-03-11 DIAGNOSIS — K219 Gastro-esophageal reflux disease without esophagitis: Secondary | ICD-10-CM

## 2023-03-11 DIAGNOSIS — N179 Acute kidney failure, unspecified: Secondary | ICD-10-CM | POA: Diagnosis not present

## 2023-03-11 DIAGNOSIS — R351 Nocturia: Secondary | ICD-10-CM

## 2023-03-11 DIAGNOSIS — R55 Syncope and collapse: Secondary | ICD-10-CM | POA: Diagnosis not present

## 2023-03-11 DIAGNOSIS — I482 Chronic atrial fibrillation, unspecified: Secondary | ICD-10-CM | POA: Diagnosis not present

## 2023-03-11 DIAGNOSIS — E878 Other disorders of electrolyte and fluid balance, not elsewhere classified: Secondary | ICD-10-CM

## 2023-03-11 DIAGNOSIS — R937 Abnormal findings on diagnostic imaging of other parts of musculoskeletal system: Secondary | ICD-10-CM | POA: Diagnosis not present

## 2023-03-11 DIAGNOSIS — A09 Infectious gastroenteritis and colitis, unspecified: Secondary | ICD-10-CM

## 2023-03-11 LAB — CBC
HCT: 32.2 % — ABNORMAL LOW (ref 39.0–52.0)
Hemoglobin: 11.4 g/dL — ABNORMAL LOW (ref 13.0–17.0)
MCH: 31.4 pg (ref 26.0–34.0)
MCHC: 35.4 g/dL (ref 30.0–36.0)
MCV: 88.7 fL (ref 80.0–100.0)
Platelets: 189 10*3/uL (ref 150–400)
RBC: 3.63 MIL/uL — ABNORMAL LOW (ref 4.22–5.81)
RDW: 13.1 % (ref 11.5–15.5)
WBC: 4.7 10*3/uL (ref 4.0–10.5)
nRBC: 0 % (ref 0.0–0.2)

## 2023-03-11 LAB — GASTROINTESTINAL PANEL BY PCR, STOOL (REPLACES STOOL CULTURE)

## 2023-03-11 LAB — CBC WITH DIFFERENTIAL/PLATELET
Abs Immature Granulocytes: 0.23 10*3/uL — ABNORMAL HIGH (ref 0.00–0.07)
Basophils Absolute: 0.1 10*3/uL (ref 0.0–0.1)
Basophils Relative: 1 %
Eosinophils Absolute: 0 10*3/uL (ref 0.0–0.5)
Eosinophils Relative: 1 %
HCT: 35.9 % — ABNORMAL LOW (ref 39.0–52.0)
Hemoglobin: 12.5 g/dL — ABNORMAL LOW (ref 13.0–17.0)
Immature Granulocytes: 4 %
Lymphocytes Relative: 26 %
Lymphs Abs: 1.4 10*3/uL (ref 0.7–4.0)
MCH: 31.5 pg (ref 26.0–34.0)
MCHC: 34.8 g/dL (ref 30.0–36.0)
MCV: 90.4 fL (ref 80.0–100.0)
Monocytes Absolute: 1.3 10*3/uL — ABNORMAL HIGH (ref 0.1–1.0)
Monocytes Relative: 24 %
Neutro Abs: 2.4 10*3/uL (ref 1.7–7.7)
Neutrophils Relative %: 44 %
Platelets: 222 10*3/uL (ref 150–400)
RBC: 3.97 MIL/uL — ABNORMAL LOW (ref 4.22–5.81)
RDW: 13 % (ref 11.5–15.5)
WBC: 5.4 10*3/uL (ref 4.0–10.5)
nRBC: 0 % (ref 0.0–0.2)

## 2023-03-11 LAB — COMPREHENSIVE METABOLIC PANEL
ALT: 16 U/L (ref 0–44)
ALT: 19 U/L (ref 0–44)
AST: 22 U/L (ref 15–41)
AST: 26 U/L (ref 15–41)
Albumin: 2.7 g/dL — ABNORMAL LOW (ref 3.5–5.0)
Albumin: 3.1 g/dL — ABNORMAL LOW (ref 3.5–5.0)
Alkaline Phosphatase: 37 U/L — ABNORMAL LOW (ref 38–126)
Alkaline Phosphatase: 39 U/L (ref 38–126)
Anion gap: 5 (ref 5–15)
Anion gap: 8 (ref 5–15)
BUN: 34 mg/dL — ABNORMAL HIGH (ref 8–23)
BUN: 31 mg/dL — ABNORMAL HIGH (ref 8–23)
CO2: 23 mmol/L (ref 22–32)
CO2: 23 mmol/L (ref 22–32)
Calcium: 7.9 mg/dL — ABNORMAL LOW (ref 8.9–10.3)
Calcium: 8.5 mg/dL — ABNORMAL LOW (ref 8.9–10.3)
Chloride: 111 mmol/L (ref 98–111)
Chloride: 109 mmol/L (ref 98–111)
Creatinine, Ser: 0.97 mg/dL (ref 0.61–1.24)
Creatinine, Ser: 0.95 mg/dL (ref 0.61–1.24)
GFR, Estimated: >60 mL/min (ref >60)
GFR, Estimated: >60 mL/min (ref >60)
Glucose, Bld: 99 mg/dL (ref 70–99)
Glucose, Bld: 113 mg/dL — ABNORMAL HIGH (ref 70–99)
Potassium: 3.1 mmol/L — ABNORMAL LOW (ref 3.5–5.1)
Potassium: 3.3 mmol/L — ABNORMAL LOW (ref 3.5–5.1)
Sodium: 139 mmol/L (ref 135–145)
Sodium: 140 mmol/L (ref 135–145)
Total Bilirubin: 0.9 mg/dL (ref 0.0–1.2)
Total Bilirubin: 1.2 mg/dL (ref 0.0–1.2)
Total Protein: 5.2 g/dL — ABNORMAL LOW (ref 6.5–8.1)
Total Protein: 5.9 g/dL — ABNORMAL LOW (ref 6.5–8.1)

## 2023-03-11 LAB — MAGNESIUM
Magnesium: 1.9 mg/dL (ref 1.7–2.4)
Magnesium: 1.8 mg/dL (ref 1.7–2.4)

## 2023-03-11 LAB — PHOSPHORUS: Phosphorus: 1.5 mg/dL — ABNORMAL LOW (ref 2.5–4.6)

## 2023-03-11 LAB — OCCULT BLOOD X 1 CARD TO LAB, STOOL: Fecal Occult Bld: POSITIVE — AB

## 2023-03-11 MED ORDER — POTASSIUM CHLORIDE CRYS ER 20 MEQ PO TBCR
40.0000 meq | EXTENDED_RELEASE_TABLET | Freq: Once | ORAL | Status: AC
  Administered 2023-03-11: 40 meq via ORAL
  Filled 2023-03-11: qty 2

## 2023-03-11 MED ORDER — APIXABAN 5 MG PO TABS
5.0000 mg | ORAL_TABLET | Freq: Two times a day (BID) | ORAL | Status: DC
  Administered 2023-03-11: 5 mg via ORAL
  Filled 2023-03-11: qty 1

## 2023-03-11 MED ORDER — K PHOS MONO-SOD PHOS DI & MONO 155-852-130 MG PO TABS
500.0000 mg | ORAL_TABLET | ORAL | Status: DC
  Administered 2023-03-11: 500 mg via ORAL
  Filled 2023-03-11: qty 2

## 2023-03-11 MED ORDER — POTASSIUM PHOSPHATES 15 MMOLE/5ML IV SOLN
45.0000 mmol | Freq: Once | INTRAVENOUS | Status: DC
  Filled 2023-03-11: qty 15

## 2023-03-11 NOTE — [place]2 (Addendum)
 [place] MEDICAID FL2 LEVEL OF CARE FORM     IDENTIFICATION  Patient Name: Phillip Shelton Birthdate: [birth year] Sex: male Admission Date (Current Location): 03/10/2023  [facility] and [place] Number:  Producer, television/film/video and Address:  [facility], [address], [place], [place] [address]      Provider Number: [id number]  Attending Physician Name and Address:  Debarah Crape, DO  Relative Name and Phone Number:       Current Level of Care: Hospital Recommended Level of Care: Assisted Living Facility Prior Approval Number:    Date Approved/Denied:   PASRR Number:    Discharge Plan: Other (Comment) (Assisted Living facility)    Current Diagnoses: Patient Active Problem List   Diagnosis Date Noted   Syncope and collapse 03/10/2023   Electrolyte abnormality 03/10/2023   Abnormal MRI, cervical spine 03/10/2023   AKI (acute kidney injury) (HCC) 03/10/2023   Diarrhea 03/10/2023   Chronic anticoagulation 02/01/2023   Encounter for well adult exam with abnormal findings 01/28/2023   Right inguinal hernia 09/21/2022   Edema 09/21/2022   Depression, major, single episode, moderate (HCC) 08/23/2022   Weakness 08/06/2022   Chronic atrial fibrillation (HCC) 08/06/2022   Ambulatory dysfunction 08/06/2022   Osteoarthritis of right glenohumeral joint 07/13/2022   Impacted cerumen of right ear 04/13/2022   Generalized osteoarthritis 12/20/2021   Insomnia 09/17/2021   Normocytic anemia 07/21/2021   Hyponatremia 07/21/2021   Vitamin D deficiency 07/20/2021   Sensorineural hearing loss (SNHL), bilateral 02/10/2021   Gait disorder 07/19/2020   Statin myopathy 07/18/2020   Osteoarthritis of left knee 04/24/2020   BPH (benign prostatic hyperplasia) 01/18/2020   History of total hip arthroplasty 12/10/2019   Synovial cyst of left popliteal space 07/24/2018   Osteoarthritis of right knee 04/19/2017   Osteoarthritis of right hip 09/19/2014   Idiopathic  Charcot's joint of foot 12/27/2012   Pure hypercholesterolemia 12/27/2012   Hemorrhoids 08/26/2010   GERD 02/12/2010    Orientation RESPIRATION BLADDER Height & Weight     Self, Time, Situation, Place  Normal Continent Weight: 83.1 kg Height:  6' (182.9 cm)  BEHAVIORAL SYMPTOMS/MOOD NEUROLOGICAL BOWEL NUTRITION STATUS      Continent Diet (See Discharge Summary)  AMBULATORY STATUS COMMUNICATION OF NEEDS Skin   Supervision Verbally Normal                       Personal Care Assistance Level of Assistance  Bathing, Feeding, Dressing Bathing Assistance: Limited assistance Feeding assistance: Independent Dressing Assistance: Limited assistance     Functional Limitations Info  Sight, Hearing, Speech Sight Info: Adequate Hearing Info: Adequate Speech Info: Adequate    SPECIAL CARE FACTORS FREQUENCY  PT (By licensed PT)     PT Frequency: 2 x per week              Contractures Contractures Info: Not present    Additional Factors Info  Code Status, Allergies Code Status Info: Full Code Allergies Info: Ambien, Atorvastatin           Current Medications (03/11/2023):  This is the current hospital active medication list Current Facility-Administered Medications  Medication Dose Route Frequency Provider Last Rate Last Admin   acetaminophen (TYLENOL) tablet 650 mg  650 mg Oral Q6H PRN Gertha Calkin, MD       Or   acetaminophen (TYLENOL) suppository 650 mg  650 mg Rectal Q6H PRN Gertha Calkin, MD       apixaban (  ELIQUIS) tablet 5 mg  5 mg Oral BID Dezii, Alexandra, DO   5 mg at 03/11/23 1018   cyanocobalamin (VITAMIN B12) tablet 1,000 mcg  1,000 mcg Oral Daily Gertha Calkin, MD   1,000 mcg at 03/11/23 1610   finasteride (PROSCAR) tablet 5 mg  5 mg Oral Daily Gertha Calkin, MD   5 mg at 03/11/23 9604   HYDROcodone-acetaminophen (NORCO/VICODIN) 5-325 MG per tablet 1 tablet  1 tablet Oral Q4H PRN Gertha Calkin, MD       mirtazapine (REMERON) tablet 7.5 mg  7.5 mg Oral  QHS Irena Cords V, MD   7.5 mg at 03/10/23 2114   morphine (PF) 2 MG/ML injection 2 mg  2 mg Intravenous Q4H PRN Gertha Calkin, MD       pantoprazole (PROTONIX) injection 40 mg  40 mg Intravenous Daily Gertha Calkin, MD   40 mg at 03/11/23 5409   potassium PHOSPHATE 45 mmol in dextrose 5 % 500 mL infusion  45 mmol Intravenous Once Dezii, Alexandra, DO       sodium chloride flush (NS) 0.9 % injection 3 mL  3 mL Intravenous Q12H Irena Cords V, MD   3 mL at 03/11/23 0938   tamsulosin (FLOMAX) capsule 0.4 mg  0.4 mg Oral QPC supper Gertha Calkin, MD         Discharge Medications: PAUSE taking these medications     hydrochlorothiazide 12.5 MG capsule Wait to take this until your doctor or other care provider tells you to start again. Commonly known as: MICROZIDE TAKE ONE CAPSULE BY MOUTH EVERY DAY FOR FLUID           STOP taking these medications     MULTIVITAMIN ADULTS PO    potassium chloride SA 20 MEQ tablet Commonly known as: KLOR-CON M           TAKE these medications     acetaminophen 500 MG tablet Commonly known as: TYLENOL Take 1,000 mg by mouth 3 (three) times daily.    apixaban 5 MG Tabs tablet Commonly known as: ELIQUIS Take 1 tablet (5 mg total) by mouth 2 (two) times daily.    cyanocobalamin 1000 MCG tablet Commonly known as: VITAMIN B12 Take 1,000 mcg by mouth daily.    diclofenac Sodium 1 % Gel Commonly known as: VOLTAREN Apply 4 g topically 4 (four) times daily. What changed:  when to take this additional instructions    finasteride 5 MG tablet Commonly known as: Proscar Take 1 tablet (5 mg total) by mouth daily. What changed: when to take this    fish oil-omega-3 fatty acids 1000 MG capsule Take 1 capsule by mouth at bedtime.    metoprolol tartrate 25 MG tablet Commonly known as: LOPRESSOR Take 0.5 tablets (12.5 mg total) by mouth 2 (two) times daily.    mirtazapine 7.5 MG tablet Commonly known as: REMERON Take 1 tablet (7.5 mg total) by  mouth at bedtime.    omeprazole 20 MG capsule Commonly known as: PRILOSEC Take 1 capsule (20 mg total) by mouth daily. What changed: when to take this    tamsulosin 0.4 MG Caps capsule Commonly known as: FLOMAX Take 1 capsule (0.4 mg total) by mouth daily after supper.    temazepam 15 MG capsule Commonly known as: RESTORIL TAKE 1 CAPSULE BY MOUTH AT BEDTIME AS NEEDED FOR SLEEP. What changed:  how much to take how to take this when to take this reasons to take this additional  instructions   Relevant Imaging Results:  Relevant Lab Results:   Additional Information SSN [id number]  Janae Bridgeman, RN

## 2023-03-11 NOTE — Progress Notes (Signed)
 Physical Therapy Quick Note  PT has completed initial evaluation.    Overall, patient at supervision assistance level.   PT Follow up recommended: Home Health PT Equipment recommended:  BSC/3n1 Complete evaluation note to follow.     Arlyss Gandy, PT, DPT Acute Rehabilitation Office [phone]

## 2023-03-11 NOTE — Progress Notes (Addendum)
 Transition of Care [facility]) - Inpatient Brief Assessment   Patient Details  Name: Phillip Shelton MRN: [id number] Date of Birth: 1934-02-15  Transition of Care [facility]) CM/SW Contact:    Janae Bridgeman, RN Phone Number: 03/11/2023, 10:32 AM   Clinical Narrative: CM met with the patient at the bedside today to discuss TOC needs.  The patient was provided with [facility] letter at the bedside.  Patient states that he lives with spouse at [facility] for the past 6 months and plans to return home when medically stable for discharge.  DME at the ALF includes a RW.    Patient states that he plans to have his daughter provide transportation to home.  03/11/23 1422 - CM spoke with Elpidio Galea, CM at [facility] and the patient is able to discharge back to ALF today.  The facility uses [facility].  I called Kandee Keen, RNCM and he accepted for services.  The CM at the facility states that the patient has a raised toilet seat and a small room with no room for bedside commode.  I called the step daughter and she will come by the hospital to provide transportation to home when he is ready for discharge.  I called and spoke with Elpidio Galea, CM at [facility] and she is aware that patient is discharging home today.  FL2 and discharge summary will be faxed to the facility at fax # [phone].   Transition of Care Asessment: Insurance and Status: (P) Insurance coverage has been reviewed Patient has primary care physician: (P) Yes Home environment has been reviewed: (P) from [facility] with spouse Prior level of function:: (P) RW Prior/Current Home Services: (P) No current home services Social Drivers of Health Review: (P) SDOH reviewed needs interventions Readmission risk has been reviewed: (P) Yes Transition of care needs: (P) transition of care needs identified, TOC will continue to follow

## 2023-03-11 NOTE — Discharge Summary (Signed)
 Physician Discharge Summary   Patient: Phillip Shelton MRN: [id number] DOB: 1934/06/30  Admit date:     03/10/2023  Discharge date: 03/11/23  Discharge Physician: Debarah Crape   PCP: Corwin Levins, MD   Recommendations at discharge:   Follow up with primary care to recheck electrolyte levels next week.  Continue to keep blood pressure log and review if hydrochlorothiazide is still an appropriate choice outpatient.  On hold at discharge. Referral to neurosurgery to discuss pain management options for severe degenerative changes of cervical spine  Discharge Diagnoses: Principal Problem:   Syncope and collapse Active Problems:   GERD   BPH (benign prostatic hyperplasia)   Chronic atrial fibrillation (HCC)   Electrolyte abnormality   Abnormal MRI, cervical spine   AKI (acute kidney injury) (HCC)   Diarrhea  Resolved Problems:   * No resolved hospital problems. *  Hospital Course: Phillip Shelton is 89 y.o. male with Afib on Eliquis, HTN, BPH, resident of ALF. Brought to hospital for weakness and fall.  Reportedly patient was getting up to go to the bathroom and he became dizzy, fell, struck his head on the wall.  Denies any loss of consciousness.  He has been struggling with diarrhea for 3 days prior to arrival. He was seen in the ED on March 3 for diarrhea dehydration and generalized weakness. He tested positive for norovirus at that time.  He reports it has been going around his assisted living facility.  On this admission he was found to be hypokalemic to 2.5, BUN 65, creatinine 1.52.  He tested positive for norovirus again.  He was admitted for observation, electrolyte repletion, and IV hydration.  Patient did well with supplementation and IV hydration.  His AKI resolved entirely.  On 3/7 patient reported feeling back to his physiologic baseline and was requesting to discharge home.  He was tolerating p.o. and had no further episodes of diarrhea on 3/7.  Orthostatic vital signs were  negative, and syncope was thought to be secondary to dehydration and profound hypokalemia.  Patient worked with physical therapy on 3/7 and it was recommended that he proceed with home health PT.  This was ordered and arranged prior to discharge. Pt was given ER return precautions.   Syncope and Collapse -2/2 to Orthostatic hypotension and electrolyte disturbance secondary to dehydration from diarrhea outpatient. - Orthostatics negative at this time  -- Monitored on tele without events - Head CT without acute pathology.  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 -- CT cervical spine: No acute fracture or malalignment.  Asymmetric severe degenerative changes, and questionable "septic arthritis" patient denies any neck pain, is afebrile, no leukocytosis. MRI confirmed unlikely infectious cause. Can consider follow-up with neurosurgery at discharge to better evaluate degenerative changes to pursue MRI at that time.     GERD - Continue PPI   Persistent atrial fibrillation - Continue Eliquis - Hold blood pressure meds given hypotension   BPH - Continue Proscar   Diarrhea - Secondary to norovirus, stool tested positive on March 3 - FOBT is positive, however given norovirus this is likely secondary to colitis.  Hemoglobin is stable.  Can follow-up with gastroenterology outpatient.  No emergent need for endoscopy on this admission. - No recent antibiotic use - C. difficile neg   AKI - Baseline creatinine 0.92 - Elevated to 1.6, secondary to dehydration.  Has improved to baseline after rehydration.  Hypokalemia Hypophosphatemia - K  2.5 on arrival - Secondary to GI losses. -- Replaced prior to DC    Deconditioning - Resides in assisted living facility - PT recommending home health and BSC, ordered.        Consultants: n/a Procedures performed: n/a  Disposition: Assisted living with  [facility] Diet recommendation:  Discharge Diet Orders (From admission, onward)     Start     Ordered   03/11/23 0000  Diet general        03/11/23 1517           Regular diet DISCHARGE MEDICATION: Allergies as of 03/11/2023       Reactions   Ambien [zolpidem] Other (See Comments)   Patient did not like the effects of this, per family   Atorvastatin Other (See Comments)   "Made me feel badly and I could not sleep"- "Allergic," per facility        Medication List     PAUSE taking these medications    hydrochlorothiazide 12.5 MG capsule Wait to take this until your doctor or other care provider tells you to start again. Commonly known as: MICROZIDE TAKE ONE CAPSULE BY MOUTH EVERY DAY FOR FLUID       STOP taking these medications    MULTIVITAMIN ADULTS PO   potassium chloride SA 20 MEQ tablet Commonly known as: KLOR-CON M       TAKE these medications    acetaminophen 500 MG tablet Commonly known as: TYLENOL Take 1,000 mg by mouth 3 (three) times daily.   apixaban 5 MG Tabs tablet Commonly known as: ELIQUIS Take 1 tablet (5 mg total) by mouth 2 (two) times daily.   cyanocobalamin 1000 MCG tablet Commonly known as: VITAMIN B12 Take 1,000 mcg by mouth daily.   diclofenac Sodium 1 % Gel Commonly known as: VOLTAREN Apply 4 g topically 4 (four) times daily. What changed:  when to take this additional instructions   finasteride 5 MG tablet Commonly known as: Proscar Take 1 tablet (5 mg total) by mouth daily. What changed: when to take this   fish oil-omega-3 fatty acids 1000 MG capsule Take 1 capsule by mouth at bedtime.   metoprolol tartrate 25 MG tablet Commonly known as: LOPRESSOR Take 0.5 tablets (12.5 mg total) by mouth 2 (two) times daily.   mirtazapine 7.5 MG tablet Commonly known as: REMERON Take 1 tablet (7.5 mg total) by mouth at bedtime.   omeprazole 20 MG capsule Commonly known as: PRILOSEC Take 1 capsule (20 mg total) by mouth  daily. What changed: when to take this   tamsulosin 0.4 MG Caps capsule Commonly known as: FLOMAX Take 1 capsule (0.4 mg total) by mouth daily after supper.   temazepam 15 MG capsule Commonly known as: RESTORIL TAKE 1 CAPSULE BY MOUTH AT BEDTIME AS NEEDED FOR SLEEP. What changed:  how much to take how to take this when to take this reasons to take this additional instructions        Follow-up Information     [facility], [facility] Follow up.   Specialty: Home Health Services Why: [facility] will provide home health services.  They will call you in the next 24-48 hours to set up services. Contact information: [address] [place] [address] [phone]                Discharge Exam: Filed Weights   03/10/23 0928 03/11/23 0500  Weight: 83.9 kg 83.1 kg   Constitutional:  Normal appearance. Non toxic-appearing.  HENT: Head Normocephalic and atraumatic.  Mucous membranes are moist.  Eyes:  Extraocular intact. Conjunctivae normal. Pupils are equal, round, and reactive to light.  Cardiovascular: Rate and Rhythm: Normal rate and regular rhythm.  Pulmonary: Non labored, symmetric rise of chest wall.  Musculoskeletal:  Normal range of motion.  Skin: warm and dry. not jaundiced.  Neurological: No focal deficit present. alert. Oriented. Psychiatric: Mood and Affect congruent.    Condition at discharge: stable  The results of significant diagnostics from this hospitalization (including imaging, microbiology, ancillary and laboratory) are listed below for reference.   Imaging Studies: MR CERVICAL SPINE WO CONTRAST Result Date: 03/10/2023 CLINICAL DATA:  Fall and weakness.  Possible infection. EXAM: MRI CERVICAL SPINE WITHOUT CONTRAST TECHNIQUE: Multiplanar, multisequence MR imaging of the cervical spine was performed. No intravenous contrast was administered. COMPARISON:  Cervical spine CT 03/10/2023 FINDINGS: Alignment: Trace anterolisthesis of  C2 on C3, C7 on T1, and T1 on T2 and trace retrolisthesis of C3 on C4. Vertebrae: No fracture. Asymmetrically severe right lateral C1-2 arthropathy as shown on CT with a small joint effusion and moderate marrow edema extending into the dens. Erosions of the right aspect of the dens as shown on CT. Mild ligamentous thickening about the dens. No epidural fluid collection. Cord: Normal signal. Posterior Fossa, vertebral arteries, paraspinal tissues: No more than mild edema in the right-sided prevertebral soft tissues at C1-2. No fluid collection. Unremarkable included posterior fossa. Preserved vertebral artery flow voids with the right vertebral artery being dominant. Disc levels: Moderate disc space narrowing at C2-3 and C3-4 and severe narrowing from C4-5 through C6-7. C2-3: Anterolisthesis with mild bulging of uncovered disc, uncovertebral spurring, and severe right and mild left facet arthrosis result in mild spinal stenosis and mild right neural foraminal stenosis. C3-4: Disc bulging, uncovertebral spurring, and mild right and severe left facet arthrosis result in mild spinal stenosis and severe bilateral neural foraminal stenosis. C4-5: Interbody and right facet ankylosis. Osteophytic ridging results in mild spinal stenosis and moderate right and mild left neural foraminal stenosis. C5-6: A broad-based posterior disc osteophyte complex and mild right and moderate left facet arthrosis result in mild-to-moderate spinal stenosis and severe right greater than left neural foraminal stenosis. C6-7: A broad-based posterior disc osteophyte complex and moderate facet arthrosis result in mild spinal stenosis and mild bilateral neural foraminal stenosis. C7-T1: Anterolisthesis with disc uncovering and moderate facet arthrosis. No significant stenosis. IMPRESSION: 1. Severe right lateral C1-2 arthropathy with moderate marrow edema and erosions of the dens, favored to be inflammatory. No findings strongly suggestive of acute  infection. 2. Advanced cervical disc and facet degeneration with mild-to-moderate spinal stenosis at C5-6 and mild spinal stenosis at multiple other levels. 3. Severe bilateral neural foraminal stenosis at C3-4 and C5-6. Electronically Signed   By: Sebastian Ache M.D.   On: 03/10/2023 18:15   CT HEAD WO CONTRAST Result Date: 03/10/2023 CLINICAL DATA:  Level 2 trauma, fall on anticoagulation. EXAM: CT HEAD WITHOUT CONTRAST CT CERVICAL SPINE WITHOUT CONTRAST TECHNIQUE: Multidetector CT imaging of the head and cervical spine was performed following the standard protocol without intravenous contrast. Multiplanar CT image reconstructions of the cervical spine were also generated. RADIATION DOSE REDUCTION: This exam was performed according to the departmental dose-optimization program which includes automated exposure control, adjustment of the mA and/or kV according to patient size and/or use of iterative reconstruction technique. COMPARISON:  CT head 08/06/2022 FINDINGS: CT HEAD FINDINGS Brain: No acute intracranial hemorrhage. No CT evidence of acute infarct. Nonspecific  hypoattenuation in the periventricular and subcortical white matter favored to reflect chronic microvascular ischemic changes. 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 No edema, mass effect, or midline shift. The basilar cisterns are patent. Ventricles: Prominence of the ventricles suggesting underlying parenchymal volume loss. Vascular: Atherosclerotic calcifications of the carotid siphons and intracranial vertebral arteries. No hyperdense vessel. Skull: No acute or aggressive finding. Similar appearance of arachnoid granulations including large arachnoid granulation involving the right occipital bone. Orbits: Orbits are symmetric. Sinuses: The visualized paranasal sinuses are clear. Other: Mastoid air cells are clear. CT CERVICAL SPINE FINDINGS Alignment: Straightening of the normal cervical lordosis. Trace anterolisthesis of C2 on C3  and of C7 on T1 and T1 on T2. Trace retrolisthesis of C3 on C4. No facet subluxation or dislocation. Skull base and vertebrae: There are prominent lucencies involving the right aspect of the dens. Additional lucencies within the right lateral mass of C1 and the right anterior arch of C1 adjacent to the region of lucency in the dens. Vertebral body heights are maintained. No evidence of compression fracture. Schmorl's node involving the T1 inferior endplate. Soft tissues and spinal canal: Soft tissue along the dorsal aspect of the dens suggestive of degenerative pannus. Additional soft tissue along the right aspect of the dens with threes mineralization which results in asymmetric narrowing of the spinal canal. Possible thickening of the transverse ligament. There is no evidence of spinal canal hematoma. The paraspinal musculature is unremarkable. Retropharyngeal course of the left carotid artery which indents the posterior or pharyngeal airway. Disc levels: Severe asymmetric degenerative changes at the right atlantoaxial articulation with areas of sclerosis and subchondral lucency. Severe disc space narrowing at C4-5 through C6-7. Additional moderate disc space narrowing at C2-3 and C3-4. Disc osteophyte complexes at multiple levels throughout the cervical spine. There is mild osseous spinal canal narrowing at C3-4 through C5-6. Facet arthrosis at multiple levels. Foraminal narrowing appears most pronounced on the left at C3-4. Upper chest: Biapical pleuroparenchymal scarring. Other: None. IMPRESSION: CT HEAD: 1. No CT evidence of acute intracranial abnormality. 2. Mild chronic microvascular ischemic changes and parenchymal volume loss. 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 CT CERVICAL SPINE: 1. No evidence of acute fracture or traumatic malalignment. 2. Lucency of the right aspect of the dens as well as the right lateral mass of C1. There are asymmetric severe degenerative changes of the adjacent  right atlantoaxial articulation. Findings could reflect a chronic inflammatory arthropathy versus septic arthritis. Recommend contrast enhanced MRI cervical spine for further evaluation. 3. Soft tissue along the dorsal aspect of the dens and extending between the dens and right lateral mass of C1. Findings could reflect asymmetric degenerative pannus versus infection/phlegmon. 4. Additional degenerative changes as above. Electronically Signed   By: Emily Filbert M.D.   On: 03/10/2023 11:13   CT CERVICAL SPINE WO CONTRAST Result Date: 03/10/2023 CLINICAL DATA:  Level 2 trauma, fall on anticoagulation. EXAM: CT HEAD WITHOUT CONTRAST CT CERVICAL SPINE WITHOUT CONTRAST TECHNIQUE: Multidetector CT imaging of the head and cervical spine was performed following the standard protocol without intravenous contrast. Multiplanar CT image reconstructions of the cervical spine were also generated. RADIATION DOSE REDUCTION: This exam was performed according to the departmental dose-optimization program which includes automated exposure control, adjustment of the mA and/or kV according to patient size and/or use of iterative reconstruction technique. COMPARISON:  CT head 08/06/2022 FINDINGS: CT HEAD FINDINGS Brain: No acute intracranial hemorrhage. No CT evidence of acute infarct. Nonspecific hypoattenuation in the periventricular  and subcortical white matter favored to reflect chronic microvascular ischemic changes. 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 No edema, mass effect, or midline shift. The basilar cisterns are patent. Ventricles: Prominence of the ventricles suggesting underlying parenchymal volume loss. Vascular: Atherosclerotic calcifications of the carotid siphons and intracranial vertebral arteries. No hyperdense vessel. Skull: No acute or aggressive finding. Similar appearance of arachnoid granulations including large arachnoid granulation involving the right occipital bone. Orbits: Orbits are  symmetric. Sinuses: The visualized paranasal sinuses are clear. Other: Mastoid air cells are clear. CT CERVICAL SPINE FINDINGS Alignment: Straightening of the normal cervical lordosis. Trace anterolisthesis of C2 on C3 and of C7 on T1 and T1 on T2. Trace retrolisthesis of C3 on C4. No facet subluxation or dislocation. Skull base and vertebrae: There are prominent lucencies involving the right aspect of the dens. Additional lucencies within the right lateral mass of C1 and the right anterior arch of C1 adjacent to the region of lucency in the dens. Vertebral body heights are maintained. No evidence of compression fracture. Schmorl's node involving the T1 inferior endplate. Soft tissues and spinal canal: Soft tissue along the dorsal aspect of the dens suggestive of degenerative pannus. Additional soft tissue along the right aspect of the dens with threes mineralization which results in asymmetric narrowing of the spinal canal. Possible thickening of the transverse ligament. There is no evidence of spinal canal hematoma. The paraspinal musculature is unremarkable. Retropharyngeal course of the left carotid artery which indents the posterior or pharyngeal airway. Disc levels: Severe asymmetric degenerative changes at the right atlantoaxial articulation with areas of sclerosis and subchondral lucency. Severe disc space narrowing at C4-5 through C6-7. Additional moderate disc space narrowing at C2-3 and C3-4. Disc osteophyte complexes at multiple levels throughout the cervical spine. There is mild osseous spinal canal narrowing at C3-4 through C5-6. Facet arthrosis at multiple levels. Foraminal narrowing appears most pronounced on the left at C3-4. Upper chest: Biapical pleuroparenchymal scarring. Other: None. IMPRESSION: CT HEAD: 1. No CT evidence of acute intracranial abnormality. 2. Mild chronic microvascular ischemic changes and parenchymal volume loss. 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 CT  CERVICAL SPINE: 1. No evidence of acute fracture or traumatic malalignment. 2. Lucency of the right aspect of the dens as well as the right lateral mass of C1. There are asymmetric severe degenerative changes of the adjacent right atlantoaxial articulation. Findings could reflect a chronic inflammatory arthropathy versus septic arthritis. Recommend contrast enhanced MRI cervical spine for further evaluation. 3. Soft tissue along the dorsal aspect of the dens and extending between the dens and right lateral mass of C1. Findings could reflect asymmetric degenerative pannus versus infection/phlegmon. 4. Additional degenerative changes as above. Electronically Signed   By: Emily Filbert M.D.   On: 03/10/2023 11:13   DG Chest Port 1 View Result Date: 03/10/2023 CLINICAL DATA:  Trauma patient.  Status post fall. EXAM: PORTABLE CHEST 1 VIEW COMPARISON:  Radiographs 08/06/2022 and 05/28/2009. FINDINGS: 0930 hours. The heart size and mediastinal contours are stable without evidence of mediastinal hematoma. The lungs are clear. There is no pleural effusion or pneumothorax. No acute fractures are identified. There are advanced glenohumeral degenerative changes bilaterally. Telemetry leads overlie the chest. IMPRESSION: No evidence of acute chest injury. Advanced glenohumeral degenerative changes bilaterally. Electronically Signed   By: Carey Bullocks M.D.   On: 03/10/2023 10:44    Microbiology: Results for orders placed or performed during the hospital encounter of 03/10/23  Resp panel by RT-PCR (RSV,  Flu A&B, Covid) Anterior Nasal Swab     Status: None   Collection Time: 03/10/23  9:25 AM   Specimen: Anterior Nasal Swab  Result Value Ref Range Status   SARS Coronavirus 2 by RT PCR NEGATIVE NEGATIVE Final   Influenza A by PCR NEGATIVE NEGATIVE Final   Influenza B by PCR NEGATIVE NEGATIVE Final    Comment: (NOTE) The Xpert Xpress SARS-CoV-2/FLU/RSV plus assay is intended as an aid in the diagnosis of influenza  from Nasopharyngeal swab specimens and should not be used as a sole basis for treatment. Nasal washings and aspirates are unacceptable for Xpert Xpress SARS-CoV-2/FLU/RSV testing.  Fact Sheet for Patients: BloggerCourse.com  Fact Sheet for Healthcare Providers: SeriousBroker.it  This test is not yet approved or cleared by the [place] FDA and has been authorized for detection and/or diagnosis of SARS-CoV-2 by FDA under an Emergency Use Authorization (EUA). This EUA will remain in effect (meaning this test can be used) for the duration of the COVID-19 declaration under Section 564(b)(1) of the Act, 21 U.S.C. section 360bbb-3(b)(1), unless the authorization is terminated or revoked.     Resp Syncytial Virus by PCR NEGATIVE NEGATIVE Final    Comment: (NOTE) Fact Sheet for Patients: BloggerCourse.com  Fact Sheet for Healthcare Providers: SeriousBroker.it  This test is not yet approved or cleared by the [place] FDA and has been authorized for detection and/or diagnosis of SARS-CoV-2 by FDA under an Emergency Use Authorization (EUA). This EUA will remain in effect (meaning this test can be used) for the duration of the COVID-19 declaration under Section 564(b)(1) of the Act, 21 U.S.C. section 360bbb-3(b)(1), unless the authorization is terminated or revoked.  Performed at [facility], [address] [place], [place] [address]   Gastrointestinal Panel by PCR , Stool     Status: Abnormal   Collection Time: 03/10/23 12:11 PM   Specimen: Stool  Result Value Ref Range Status   Campylobacter species NOT DETECTED NOT DETECTED Final   Plesimonas shigelloides NOT DETECTED NOT DETECTED Final   Salmonella species NOT DETECTED NOT DETECTED Final   Yersinia enterocolitica NOT DETECTED NOT DETECTED Final   Vibrio species NOT DETECTED NOT DETECTED Final   Vibrio cholerae NOT  DETECTED NOT DETECTED Final   Enteroaggregative E coli (EAEC) NOT DETECTED NOT DETECTED Final   Enteropathogenic E coli (EPEC) NOT DETECTED NOT DETECTED Final   Enterotoxigenic E coli (ETEC) NOT DETECTED NOT DETECTED Final   Shiga like toxin producing E coli (STEC) NOT DETECTED NOT DETECTED Final   Shigella/Enteroinvasive E coli (EIEC) NOT DETECTED NOT DETECTED Final   Cryptosporidium NOT DETECTED NOT DETECTED Final   Cyclospora cayetanensis NOT DETECTED NOT DETECTED Final   Entamoeba histolytica NOT DETECTED NOT DETECTED Final   Giardia lamblia NOT DETECTED NOT DETECTED Final   Adenovirus F40/41 NOT DETECTED NOT DETECTED Final   Astrovirus NOT DETECTED NOT DETECTED Final   Norovirus GI/GII DETECTED (A) NOT DETECTED Final    Comment: RESULT CALLED TO, READ BACK BY AND VERIFIED WITHSue Lush DAVIS AT 0347 03/11/23.PMF    Rotavirus A NOT DETECTED NOT DETECTED Final   Sapovirus (I, II, IV, and V) NOT DETECTED NOT DETECTED Final    Comment: Performed at [facility], [address] [place], [place] [address]  C Difficile Quick Screen w PCR reflex     Status: None   Collection Time: 03/10/23 12:11 PM   Specimen: Stool  Result Value Ref Range Status   C Diff antigen NEGATIVE  NEGATIVE Final   C Diff toxin NEGATIVE NEGATIVE Final   C Diff interpretation No C. difficile detected.  Final    Comment: Performed at [facility], [address], [place], [place] [address]    Labs: CBC: Recent Labs  Lab 03/07/23 1730 03/10/23 0925 03/10/23 0951 03/11/23 0739 03/11/23 0853  WBC 8.7 5.5  --  4.7 5.4  NEUTROABS 6.9  --   --   --  2.4  HGB 13.4 13.1 12.9* 11.4* 12.5*  HCT 40.8 37.4* 38.0* 32.2* 35.9*  MCV 94.4 89.5  --  88.7 90.4  PLT 214 209  --  189 222   Basic Metabolic Panel: Recent Labs  Lab 03/07/23 1730 03/10/23 0925 03/10/23 0937 03/10/23 0951 03/10/23 1909 03/11/23 0739 03/11/23 0853  NA 133* 136  --  138  --  139 140  K 2.8* 2.5*  --  2.8* 2.5* 3.1* 3.3*   CL 101 103  --  106  --  111 109  CO2 21* 19*  --   --   --  23 23  GLUCOSE 162* 107*  --  103*  --  99 113*  BUN 43* 65*  --  61*  --  34* 31*  CREATININE 1.29* 1.52*  --  1.60*  --  0.97 0.95  CALCIUM 8.7* 8.6*  --   --   --  7.9* 8.5*  MG  --   --  1.9  --   --  1.9 1.8  PHOS  --   --   --   --   --   --  1.5*   Liver Function Tests: Recent Labs  Lab 03/07/23 1730 03/10/23 0925 03/11/23 0739 03/11/23 0853  AST 27 23 22 26   ALT 15 16 16 19   ALKPHOS 56 42 37* 39  BILITOT 0.9 1.3* 0.9 1.2  PROT 7.1 6.1* 5.2* 5.9*  ALBUMIN 3.9 3.2* 2.7* 3.1*   CBG: No results for input(s): "GLUCAP" in the last 168 hours.  Discharge time spent: 31 minutes.  Signed: Debarah Crape, DO [facility] 03/11/2023

## 2023-03-11 NOTE — Plan of Care (Signed)

## 2023-03-11 NOTE — Hospital Course (Addendum)
 Phillip Shelton is 89 y.o. male with Afib on Eliquis, HTN, BPH, resident of ALF. Brought to hospital for weakness and fall.  Reportedly patient was getting up to go to the bathroom and he became dizzy, fell, struck his head on the wall.  Denies any loss of consciousness.  He has been struggling with diarrhea for 3 days prior to arrival. He was seen in the ED on March 3 for diarrhea dehydration and generalized weakness. He tested positive for norovirus at that time.  He reports it has been going around his assisted living facility.  On this admission he was found to be hypokalemic to 2.5, BUN 65, creatinine 1.52.  He tested positive for norovirus again.  He was admitted for observation, electrolyte repletion, and IV hydration.  Patient did well with supplementation and IV hydration.  His AKI resolved entirely.  On 3/7 patient reported feeling back to his physiologic baseline and was requesting to discharge home.  He was tolerating p.o. and had no further episodes of diarrhea on 3/7.  Orthostatic vital signs were negative, and syncope was thought to be secondary to dehydration and profound hypokalemia.  Patient worked with physical therapy on 3/7 and it was recommended that he proceed with home health PT.  This was ordered and arranged prior to discharge. Pt was given ER return precautions.   Syncope and Collapse -2/2 to Orthostatic hypotension and electrolyte disturbance secondary to dehydration from diarrhea outpatient. - Orthostatics negative at this time  -- Monitored on tele without events - Head CT without acute pathology.  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 -- CT cervical spine: No acute fracture or malalignment.  Asymmetric severe degenerative changes, and questionable "septic arthritis" patient denies any neck pain, is afebrile, no leukocytosis. MRI confirmed unlikely infectious cause. Can consider  follow-up with neurosurgery at discharge to better evaluate degenerative changes to pursue MRI at that time.     GERD - Continue PPI   Persistent atrial fibrillation - Continue Eliquis - Hold blood pressure meds given hypotension   BPH - Continue Proscar   Diarrhea - Secondary to norovirus, stool tested positive on March 3 - FOBT is positive, however given norovirus this is likely secondary to colitis.  Hemoglobin is stable.  Can follow-up with gastroenterology outpatient.  No emergent need for endoscopy on this admission. - No recent antibiotic use - C. difficile neg   AKI - Baseline creatinine 0.92 - Elevated to 1.6, secondary to dehydration.  Has improved to baseline after rehydration.  Hypokalemia Hypophosphatemia - K 2.5 on arrival - Secondary to GI losses. -- Replaced prior to DC    Deconditioning - Resides in assisted living facility - PT recommending home health and BSC, ordered.

## 2023-03-11 NOTE — Progress Notes (Signed)
 Reviewed AVS, patient expressed understanding of medications, MD follow up reviewed.   Removed IV, Site clean, dry and intact.  Patient states all belongings brought to the hospital at time of admission are accounted for and packed to take home.  Pt transported to entrance A where family member was waiting in vehicle to transport home.

## 2023-03-11 NOTE — Progress Notes (Signed)
 Mobility Specialist: Progress Note   03/11/23 1200  Mobility  Activity Transferred to/from [facility]  Level of Assistance Standby assist, set-up cues, supervision of patient - no hands on  Assistive Device None  Mobility visit 1 Mobility  Mobility Specialist Start Time (ACUTE ONLY) 1055  Mobility Specialist Stop Time (ACUTE ONLY) 1100  Mobility Specialist Time Calculation (min) (ACUTE ONLY) 5 min    Responded to call bell request for BR assistance - received on EOB. SV throughout just needed assist with line management. Left on [facility] with all needs met, call bell in reach.   Maurene Capes Mobility Specialist Please contact via SecureChat or Rehab office at [phone]

## 2023-03-11 NOTE — Evaluation (Signed)
 Physical Therapy Evaluation Patient Details Name: Phillip Shelton MRN: [id number] DOB: 15-Sep-1934 Today's Date: 03/11/2023  History of Present Illness  89 y.o. male presents to [facility] hospital on 03/10/2023 for weakness and a fall, preceded by dizziness. Pt was recently evaluated for diarrhea and dehydration. PMH includes afib, HTN, BPH.  Clinical Impression  Pt presents to PT with deficits in functional mobility, gait, balance, power, endurance. Pt is able to transfer and ambulate for household distances with support of RW. Pt reports feeling back to his baseline at this time. PT provides education on strategies to reduce the risk of potential syncopal episodes due to recent dehydration. PT recommends discharge home with HHPT and a BSC when medically appropriate.        If plan is discharge home, recommend the following: A little help with bathing/dressing/bathroom;Assistance with cooking/housework;Assist for transportation;Help with stairs or ramp for entrance   Can travel by private vehicle        Equipment Recommendations BSC/3in1  Recommendations for Other Services       Functional Status Assessment Patient has had a recent decline in their functional status and demonstrates the ability to make significant improvements in function in a reasonable and predictable amount of time.     Precautions / Restrictions Precautions Precautions: Fall Recall of Precautions/Restrictions: Intact Restrictions Weight Bearing Restrictions Per Provider Order: No      Mobility  Bed Mobility                    Transfers Overall transfer level: Needs assistance Equipment used: Rolling walker (2 wheels) Transfers: Sit to/from Stand Sit to Stand: Supervision                Ambulation/Gait Ambulation/Gait assistance: Supervision Gait Distance (Feet): 90 Feet Assistive device: Rolling walker (2 wheels) Gait Pattern/deviations: Step-through pattern Gait velocity: reduced Gait  velocity interpretation: <1.8 ft/sec, indicate of risk for recurrent falls   General Gait Details: slowed step-through gait, mild increase in trunk flexion  Stairs            Wheelchair Mobility     Tilt Bed    Modified Rankin (Stroke Patients Only)       Balance Overall balance assessment: Needs assistance Sitting-balance support: No upper extremity supported, Feet supported Sitting balance-Leahy Scale: Good     Standing balance support: Single extremity supported, Reliant on assistive device for balance Standing balance-Leahy Scale: Poor                               Pertinent Vitals/Pain Pain Assessment Pain Assessment: Faces Faces Pain Scale: Hurts little more Pain Location: back Pain Descriptors / Indicators: Aching Pain Intervention(s): Monitored during session    Home Living Family/patient expects to be discharged to:: Assisted living                 Home Equipment: Agricultural consultant (2 wheels);Grab bars - toilet;Grab bars - tub/shower      Prior Function Prior Level of Function : Independent/Modified Independent             Mobility Comments: ambulatory with RW, reports no other recent falls ADLs Comments: assistance for bathing, cooking and cleaning     Extremity/Trunk Assessment   Upper Extremity Assessment Upper Extremity Assessment: Generalized weakness    Lower Extremity Assessment Lower Extremity Assessment: Generalized weakness    Cervical / Trunk Assessment Cervical / Trunk Assessment: Kyphotic  Communication  Communication Communication: Impaired Factors Affecting Communication: Hearing impaired    Cognition Arousal: Alert Behavior During Therapy: WFL for tasks assessed/performed   PT - Cognitive impairments: No apparent impairments                         Following commands: Intact       Cueing Cueing Techniques: Verbal cues     General Comments General comments (skin integrity, edema,  etc.): VSS on RA, pt in NAD    Exercises     Assessment/Plan    PT Assessment Patient needs continued PT services  PT Problem List Decreased strength;Decreased activity tolerance;Decreased mobility;Decreased balance;Cardiopulmonary status limiting activity       PT Treatment Interventions DME instruction;Gait training;Functional mobility training;Stair training;Therapeutic activities;Therapeutic exercise;Balance training;Neuromuscular re-education;Patient/family education    PT Goals (Current goals can be found in the Care Plan section)  Acute Rehab PT Goals Patient Stated Goal: to return home PT Goal Formulation: With patient Time For Goal Achievement: 03/25/23 Potential to Achieve Goals: Good    Frequency Min 2X/week     Co-evaluation               AM-PAC PT "6 Clicks" Mobility  Outcome Measure Help needed turning from your back to your side while in a flat bed without using bedrails?: A Little Help needed moving from lying on your back to sitting on the side of a flat bed without using bedrails?: A Little Help needed moving to and from a bed to a chair (including a wheelchair)?: A Little Help needed standing up from a chair using your arms (e.g., wheelchair or bedside chair)?: A Little Help needed to walk in hospital room?: A Little Help needed climbing 3-5 steps with a railing? : A Lot 6 Click Score: 17    End of Session Equipment Utilized During Treatment: Gait belt Activity Tolerance: Patient tolerated treatment well Patient left: in chair;with chair alarm set Nurse Communication: Mobility status PT Visit Diagnosis: Other abnormalities of gait and mobility (R26.89);Muscle weakness (generalized) (M62.81)    Time: 1478-2956 PT Time Calculation (min) (ACUTE ONLY): 26 min   Charges:   PT Evaluation $PT Eval Low Complexity: 1 Low   PT General Charges $$ ACUTE PT VISIT: 1 Visit         Arlyss Gandy, PT, DPT Acute Rehabilitation Office  [phone]   Arlyss Gandy 03/11/2023, 2:46 PM

## 2023-03-11 NOTE — Care Management Obs Status (Signed)
 MEDICARE OBSERVATION STATUS NOTIFICATION   Patient Details  Name: STRIDER VALLANCE MRN: [id number] Date of Birth: [birth year]   Medicare Observation Status Notification Given:  Yes    Janae Bridgeman, RN 03/11/2023, 10:17 AM

## 2023-03-14 ENCOUNTER — Telehealth: Payer: Self-pay

## 2023-03-14 NOTE — Transitions of Care (Post Inpatient/ED Visit) (Signed)
 03/14/2023  Name: Phillip Shelton MRN: [id number] DOB: 1934-06-18  Today's TOC FU Call Status: Today's TOC FU Call Status:: Successful TOC FU Call Completed Unsuccessful Call (1st Attempt) Date: 03/14/23 [facility] FU Call Complete Date: 03/14/23 Patient's Name and Date of Birth confirmed.  Transition Care Management Follow-up Telephone Call Date of Discharge: 03/11/23 Discharge Facility: [facility] ([facility]) Type of Discharge: Emergency Department Reason for ED Visit: Other: (Syncope and collapse) How have you been since you were released from the hospital?: Better Any questions or concerns?: No  Items Reviewed: Did you receive and understand the discharge instructions provided?: Yes Medications obtained,verified, and reconciled?: Yes (Medications Reviewed) Any new allergies since your discharge?: No Dietary orders reviewed?: NA Do you have support at home?: Yes People in Home: child(ren), adult  Medications Reviewed Today: Medications Reviewed Today     Reviewed by Leigh Aurora, CMA (Certified Medical Assistant) on 03/14/23 at 1410  Med List Status: <None>   Medication Order Taking? Sig Documenting Provider Last Dose Status Informant  acetaminophen (TYLENOL) 500 MG tablet [id number] No Take 1,000 mg by mouth 3 (three) times daily. [provider] 03/10/2023 Morning Active Nursing Home Medication Administration Guide (MAG), Pharmacy Records, Multiple Informants  apixaban (ELIQUIS) 5 MG TABS tablet [id number] No Take 1 tablet (5 mg total) by mouth 2 (two) times daily. Jerald Kief, MD 03/10/2023  8:00 AM Active Nursing Home Medication Administration Guide (MAG), Pharmacy Records, Multiple Informants  cyanocobalamin (VITAMIN B12) 1000 MCG tablet [id number] No Take 1,000 mcg by mouth daily. [provider] 03/07/2023 Active Nursing Home Medication Administration Guide (MAG), Pharmacy Records, Multiple Informants  diclofenac Sodium (VOLTAREN) 1 % GEL [id number] No  Apply 4 g topically 4 (four) times daily.  Patient taking differently: Apply 4 g topically in the morning, at noon, in the evening, and at bedtime. Both legs   Corwin Levins, MD 03/10/2023 Active Nursing Home Medication Administration Guide (MAG), Pharmacy Records, Multiple Informants  finasteride (PROSCAR) 5 MG tablet [id number] No Take 1 tablet (5 mg total) by mouth daily.  Patient taking differently: Take 5 mg by mouth at bedtime.   Corwin Levins, MD 03/08/2023 Active Nursing Home Medication Administration Guide (MAG), Pharmacy Records, Multiple Informants  fish oil-omega-3 fatty acids 1000 MG capsule [id number] No Take 1 capsule by mouth at bedtime. [provider] 03/08/2023 Active Nursing Home Medication Administration Guide (MAG), Pharmacy Records, Multiple Informants  hydrochlorothiazide (MICROZIDE) 12.5 MG capsule [id number] No TAKE ONE CAPSULE BY MOUTH EVERY DAY FOR FLUID  Patient taking differently: Take 12.5 mg by mouth daily.   Corwin Levins, MD 03/10/2023 Active Nursing Home Medication Administration Guide (MAG), Pharmacy Records, Multiple Informants  metoprolol tartrate (LOPRESSOR) 25 MG tablet [id number] No Take 0.5 tablets (12.5 mg total) by mouth 2 (two) times daily. Jerald Kief, MD 03/10/2023 Expired 03/10/23 2359 Nursing Home Medication Administration Guide (MAG), Pharmacy Records, Multiple Informants  mirtazapine (REMERON) 7.5 MG tablet [id number] No Take 1 tablet (7.5 mg total) by mouth at bedtime. Corwin Levins, MD 03/05/2023 Active Nursing Home Medication Administration Guide (MAG), Pharmacy Records, Multiple Informants  omeprazole (PRILOSEC) 20 MG capsule [id number] No Take 1 capsule (20 mg total) by mouth daily.  Patient taking differently: Take 20 mg by mouth daily before breakfast.   Doree Albee, PA-C 03/07/2023 Active Nursing Home Medication Administration Guide (MAG), Pharmacy Records, Multiple Informants  tamsulosin (FLOMAX) 0.4 MG CAPS capsule [id number] No Take 1 capsule  (0.4 mg total) by mouth daily after supper. Rhona Leavens,  Scheryl Marten, MD 03/07/2023 Active Nursing Home Medication Administration Guide (MAG), Pharmacy Records, Multiple Informants  temazepam (RESTORIL) 15 MG capsule [id number] No TAKE 1 CAPSULE BY MOUTH AT BEDTIME AS NEEDED FOR SLEEP.  Patient taking differently: Take 15 mg by mouth at bedtime as needed for sleep.   Corwin Levins, MD Taking Active Nursing Home Medication Administration Guide (MAG), Pharmacy Records, Multiple Informants           Med Note Cheron Schaumann, [place]   Thu Mar 10, 2023 11:00 AM) No last dose.   Med List Note Vela Prose 03/07/23 1633): [facility] [address], [place], [place] [address]  [phone]            Home Care and Equipment/Supplies: Were Home Health Services Ordered?: NA Any new equipment or medical supplies ordered?: NA  Functional Questionnaire: Do you need assistance with bathing/showering or dressing?: No Do you need assistance with meal preparation?: No Do you need assistance with eating?: No Do you have difficulty maintaining continence: No Do you need assistance with getting out of bed/getting out of a chair/moving?: No Do you have difficulty managing or taking your medications?: No  Follow up appointments reviewed: PCP Follow-up appointment confirmed?: Yes Date of PCP follow-up appointment?: 03/15/23 Follow-up Provider: Dr. Oliver Barre Specialist [facility] Follow-up appointment confirmed?: NA Do you need transportation to your follow-up appointment?: No Do you understand care options if your condition(s) worsen?: Yes-patient verbalized understanding    SIGNATURE Agnes Lawrence, CMA (AAMA)  [facility] [phone]

## 2023-03-14 NOTE — Transitions of Care (Post Inpatient/ED Visit) (Signed)
   03/14/2023  Name: Phillip Shelton MRN: [id number] DOB: 1934/06/12  Today's TOC FU Call Status: Today's TOC FU Call Status:: Unsuccessful Call (1st Attempt) Unsuccessful Call (1st Attempt) Date: 03/14/23  Attempted to reach the patient regarding the most recent Inpatient/ED visit.  Follow Up Plan: Additional outreach attempts will be made to reach the patient to complete the Transitions of Care (Post Inpatient/ED visit) call.   Signature Agnes Lawrence, CMA (AAMA)  [facility] [phone]

## 2023-03-15 ENCOUNTER — Encounter: Payer: Self-pay | Admitting: Internal Medicine

## 2023-03-15 ENCOUNTER — Ambulatory Visit (INDEPENDENT_AMBULATORY_CARE_PROVIDER_SITE_OTHER): Admitting: Internal Medicine

## 2023-03-15 VITALS — BP 120/62 | HR 95 | Temp 98.1°F | Ht 72.0 in | Wt 185.0 lb

## 2023-03-15 DIAGNOSIS — D649 Anemia, unspecified: Secondary | ICD-10-CM | POA: Diagnosis not present

## 2023-03-15 DIAGNOSIS — M14671 Charcot's joint, right ankle and foot: Secondary | ICD-10-CM | POA: Diagnosis not present

## 2023-03-15 DIAGNOSIS — N179 Acute kidney failure, unspecified: Secondary | ICD-10-CM | POA: Diagnosis not present

## 2023-03-15 DIAGNOSIS — R609 Edema, unspecified: Secondary | ICD-10-CM

## 2023-03-15 DIAGNOSIS — M4802 Spinal stenosis, cervical region: Secondary | ICD-10-CM | POA: Diagnosis not present

## 2023-03-15 DIAGNOSIS — E878 Other disorders of electrolyte and fluid balance, not elsewhere classified: Secondary | ICD-10-CM

## 2023-03-15 DIAGNOSIS — I4819 Other persistent atrial fibrillation: Secondary | ICD-10-CM | POA: Diagnosis not present

## 2023-03-15 DIAGNOSIS — I10 Essential (primary) hypertension: Secondary | ICD-10-CM | POA: Diagnosis not present

## 2023-03-15 LAB — BASIC METABOLIC PANEL
BUN: 19 mg/dL (ref 6–23)
CO2: 28 meq/L (ref 19–32)
Calcium: 8.4 mg/dL (ref 8.4–10.5)
Chloride: 102 meq/L (ref 96–112)
Creatinine, Ser: 0.96 mg/dL (ref 0.40–1.50)
GFR: 71.72 mL/min (ref >60.00)
Glucose, Bld: 100 mg/dL — ABNORMAL HIGH (ref 70–99)
Potassium: 4.2 meq/L (ref 3.5–5.1)
Sodium: 137 meq/L (ref 135–145)

## 2023-03-15 NOTE — Progress Notes (Signed)
 Patient ID: Phillip Shelton, male   DOB: 1934-07-24, 89 y.o.   MRN: [id number]        Chief Complaint: follow up post hospn mar 6 - 7 2025 with syncope       HPI:  Phillip Shelton is a 89 y.o. male here with above after 3 days diarrhea, found to have low volume, low K, AKI able for d/c with home PT.  Now acuatlly not holding further hct 12.5 mg and in fact restarted when he got back to asissted living, but taking all else including eliquis.  To start PT later this wk at home.  Has been eating solid food and no diarrhea x 2 days, in fact normal BM today.  Multiple others in the assisted living with the same - ? Norovirus.      Wt Readings from Last 3 Encounters:  03/15/23 185 lb (83.9 kg)  03/11/23 183 lb 3.2 oz (83.1 kg)  01/28/23 185 lb (83.9 kg)   BP Readings from Last 3 Encounters:  03/18/23 118/62  03/15/23 120/62  03/11/23 127/69         Past Medical History:  Diagnosis Date   Arthritis    Atrial fibrillation (HCC)    Blood in stool 02/12/2010   Qualifier: Diagnosis of  By: Misty Stanley CMA (AAMA), Amanda     BPH (benign prostatic hyperplasia) 01/18/2020   Complication of anesthesia    Diverticulosis    Esophageal stricture    GERD 02/12/2010   Qualifier: Diagnosis of  By: Misty Stanley CMA (AAMA), Marchelle Folks     GERD (gastroesophageal reflux disease)    Hemorrhoids    Hiatal hernia    Idiopathic Charcot's joint of foot 12/27/2012   Mallory - Weiss tear 1981   OAB (overactive bladder) 12/27/2012   Osteoarthritis of right hip 09/19/2014   PONV (postoperative nausea and vomiting)    Pure hypercholesterolemia 12/27/2012   Past Surgical History:  Procedure Laterality Date   CHOLECYSTECTOMY     COLONOSCOPY     GASTROCNEMIUS RECESSION  02/03/2011   Procedure: GASTROCNEMIUS SLIDE;  Surgeon: Sherri Rad, MD;  Location: [facility];  Service: Orthopedics;  Laterality: Right;   HAND SURGERY  jan 2016   rt hand   HEMORRHOID SURGERY  1990   INGUINAL HERNIA REPAIR Right  11/10/2022   Procedure: RIGHT INGUINAL HERNIA REPAIR WITH MESH;  Surgeon: Manus Rudd, MD;  Location: [facility];  Service: General;  Laterality: Right;  LMA & TAP BLOCK   TOTAL HIP ARTHROPLASTY Right 09/19/2014   Procedure: RIGHT TOTAL HIP ARTHROPLASTY ANTERIOR APPROACH;  Surgeon: Samson Frederic, MD;  Location: [facility];  Service: Orthopedics;  Laterality: Right;   UMBILICAL HERNIA REPAIR     UPPER GASTROINTESTINAL ENDOSCOPY      reports that he quit smoking about 43 years ago. His smoking use included cigarettes. He has never used smokeless tobacco. He reports that he does not currently use alcohol. He reports that he does not use drugs. family history includes Arthritis in his father; Colon cancer in his maternal uncle; Hypertension in his father. Allergies  Allergen Reactions   Ambien [Zolpidem] Other (See Comments)    Patient did not like the effects of this, per family   Atorvastatin Other (See Comments)    "Made me feel badly and I could not sleep"- "Allergic," per facility   Current Outpatient Medications on File Prior to Visit  Medication Sig Dispense Refill   acetaminophen (TYLENOL) 500 MG tablet Take 1,000 mg by  mouth 3 (three) times daily.     cyanocobalamin (VITAMIN B12) 1000 MCG tablet Take 1,000 mcg by mouth daily.     diclofenac Sodium (VOLTAREN) 1 % GEL Apply 4 g topically 4 (four) times daily. (Patient taking differently: Apply 4 g topically in the morning, at noon, in the evening, and at bedtime. Both legs) 100 g 11   finasteride (PROSCAR) 5 MG tablet Take 1 tablet (5 mg total) by mouth daily. (Patient taking differently: Take 5 mg by mouth at bedtime.) 90 tablet 3   fish oil-omega-3 fatty acids 1000 MG capsule Take 1 capsule by mouth at bedtime.     omeprazole (PRILOSEC) 20 MG capsule Take 1 capsule (20 mg total) by mouth daily. (Patient taking differently: Take 20 mg by mouth daily before breakfast.) 90 capsule 2   tamsulosin (FLOMAX) 0.4 MG CAPS capsule Take 1 capsule (0.4 mg  total) by mouth daily after supper. 30 capsule 0   temazepam (RESTORIL) 15 MG capsule TAKE 1 CAPSULE BY MOUTH AT BEDTIME AS NEEDED FOR SLEEP. (Patient taking differently: Take 15 mg by mouth at bedtime as needed for sleep.) 30 capsule 2   metoprolol tartrate (LOPRESSOR) 25 MG tablet Take 0.5 tablets (12.5 mg total) by mouth 2 (two) times daily. 60 tablet 0   No current facility-administered medications on file prior to visit.        ROS:  All others reviewed and negative.  Objective        PE:  BP 120/62 (BP Location: Right Arm, Patient Position: Sitting, Cuff Size: Normal)   Pulse 95   Temp 98.1 F (36.7 C) (Oral)   Ht 6' (1.829 m)   Wt 185 lb (83.9 kg)   SpO2 98%   BMI 25.09 kg/m                 Constitutional: Pt appears in NAD               HENT: Head: NCAT.                Right Ear: External ear normal.                 Left Ear: External ear normal.                Eyes: . Pupils are equal, round, and reactive to light. Conjunctivae and EOM are normal               Nose: without d/c or deformity               Neck: Neck supple. Gross normal ROM               Cardiovascular: Normal rate and regular rhythm.                 Pulmonary/Chest: Effort normal and breath sounds without rales or wheezing.                Abd:  Soft, NT, ND, + BS, no organomegaly               Neurological: Pt is alert. At baseline orientation, motor grossly intact               Skin: Skin is warm. No rashes, no other new lesions, LE edema - trace LLE only               Psychiatric: Pt behavior is normal without agitation   Micro: none  Cardiac tracings I have personally interpreted today:  none  Pertinent Radiological findings (summarize): none   Lab Results  Component Value Date   WBC 5.4 03/11/2023   HGB 12.5 (L) 03/11/2023   HCT 35.9 (L) 03/11/2023   PLT 222 03/11/2023   GLUCOSE 100 (H) 03/15/2023   CHOL 193 01/28/2023   TRIG 115.0 01/28/2023   HDL 59.80 01/28/2023   LDLDIRECT 133.0  12/22/2012   LDLCALC 111 (H) 01/28/2023   ALT 19 03/11/2023   AST 26 03/11/2023   NA 137 03/15/2023   K 4.2 03/15/2023   CL 102 03/15/2023   CREATININE 0.96 03/15/2023   BUN 19 03/15/2023   CO2 28 03/15/2023   TSH 0.779 03/10/2023   INR 1.3 (H) 03/10/2023   HGBA1C 5.9 01/28/2023   MICROALBUR 3.3 (H) 12/26/2013   Assessment/Plan:  Phillip Shelton is a 89 y.o. White or Caucasian [1] male with  has a past medical history of Arthritis, Atrial fibrillation (HCC), Blood in stool (02/12/2010), BPH (benign prostatic hyperplasia) (01/18/2020), Complication of anesthesia, Diverticulosis, Esophageal stricture, GERD (02/12/2010), GERD (gastroesophageal reflux disease), Hemorrhoids, Hiatal hernia, Idiopathic Charcot's joint of foot (12/27/2012), Mallory - Weiss tear (1981), OAB (overactive bladder) (12/27/2012), Osteoarthritis of right hip (09/19/2014), PONV (postoperative nausea and vomiting), and Pure hypercholesterolemia (12/27/2012).  Edema Pt advised to hold on further taking HCT even low dose 12.5 mg every day, as is high risk for him if he gets otherwise ill such as this GI illness.    Electrolyte abnormality With recent low K - for f/u lab  AKI (acute kidney injury) (HCC) Mild, s/p IVF - for f/u lab  Followup: Return in about 4 months (around 07/28/2023).  Oliver Barre, MD 03/19/2023 3:20 PM [facility] [place] Internal Medicine

## 2023-03-15 NOTE — Patient Instructions (Addendum)
 Ok to stop the Potassium if you are taking this  Ok to CONTINUE the HCT 12.5 mg per day  Please continue all other medications as before  Please have the pharmacy call with any other refills you may need.  Please keep your appointments with your specialists as you may have planned  Please go to the LAB at the blood drawing area for the tests to be done  You will be contacted by phone if any changes need to be made immediately.  Otherwise, you will receive a letter about your results with an explanation, but please check with MyChart first.  Please make an Appointment to return in July 24, or sooner if needed

## 2023-03-17 ENCOUNTER — Telehealth: Payer: Self-pay | Admitting: Internal Medicine

## 2023-03-17 NOTE — Telephone Encounter (Signed)
 Copied from CRM [id number]. Topic: Clinical - Home Health Verbal Orders >> Mar 17, 2023  9:11 AM Jaymes Graff A wrote: Caller/Agency: Gala Romney, PT from [facility] Callback Number: [phone] Service Requested: Physical Therapy Frequency: Lendon Collar Any new concerns about the patient? No

## 2023-03-17 NOTE — Telephone Encounter (Signed)
 Ok for verbal

## 2023-03-18 ENCOUNTER — Ambulatory Visit: Admitting: Internal Medicine

## 2023-03-18 ENCOUNTER — Encounter: Payer: Self-pay | Admitting: Internal Medicine

## 2023-03-18 ENCOUNTER — Ambulatory Visit: Payer: Self-pay | Admitting: Internal Medicine

## 2023-03-18 VITALS — BP 118/62 | HR 79 | Temp 98.1°F | Ht 72.0 in

## 2023-03-18 DIAGNOSIS — Z7901 Long term (current) use of anticoagulants: Secondary | ICD-10-CM | POA: Diagnosis not present

## 2023-03-18 DIAGNOSIS — E7800 Pure hypercholesterolemia, unspecified: Secondary | ICD-10-CM | POA: Diagnosis not present

## 2023-03-18 DIAGNOSIS — E559 Vitamin D deficiency, unspecified: Secondary | ICD-10-CM | POA: Diagnosis not present

## 2023-03-18 DIAGNOSIS — R609 Edema, unspecified: Secondary | ICD-10-CM | POA: Diagnosis not present

## 2023-03-18 DIAGNOSIS — F5101 Primary insomnia: Secondary | ICD-10-CM

## 2023-03-18 MED ORDER — APIXABAN 5 MG PO TABS: 5.0000 mg | ORAL_TABLET | Freq: Two times a day (BID) | ORAL | 3 refills | Status: DC

## 2023-03-18 MED ORDER — MIRTAZAPINE 15 MG PO TBDP: 15.0000 mg | ORAL_TABLET | Freq: Every day | ORAL | 3 refills | Status: DC

## 2023-03-18 NOTE — Telephone Encounter (Signed)
 Called and gave verbals.

## 2023-03-18 NOTE — Patient Instructions (Addendum)
 Ok to stop the HCT (hydrochlorothiazide) fluid pill  Please use the compression stockings to the legs - daytime only  Ok to increase the mirtazipine (remeron) to 15 mg at bedtime  Please continue all other medications as before, and refills have been done if requested - eliquis  Please have the pharmacy call with any other refills you may need.  Please continue your efforts at being more active, low cholesterol diet, and weight control.  You are otherwise up to date with prevention measures today.  Please keep your appointments with your specialists as you may have planned  Please make an Appointment to return in 6 months, or sooner if needed

## 2023-03-18 NOTE — Assessment & Plan Note (Signed)
 Lab Results  Component Value Date   LDLCALC 111 (H) 01/28/2023   Uncontrolled, declines statin, for lower chol diet

## 2023-03-18 NOTE — Assessment & Plan Note (Signed)
 Worsening off the HCT - to remain off given recent AKI and now low normal BP - for compression stockings bilateral daytime, leg elevation, low salt

## 2023-03-18 NOTE — Assessment & Plan Note (Signed)
 Last vitamin D Lab Results  Component Value Date   VD25OH 37.50 01/20/2022   Low, to start oral replacement

## 2023-03-18 NOTE — Telephone Encounter (Signed)
   Chief Complaint: Leg swelling Symptoms: leg swelling- new onset- left is worse than right, knee down, patient states he has some decrease in am after laying all night Frequency: 2 days Pertinent Negatives: Patient denies pain, redness Disposition: [] ED /[] Urgent Care (no appt availability in office) / [x] Appointment(In office/virtual)/ []  [facility]/ [] Home Care/ [] Refused Recommended Disposition /[] [place] [facility]/ []  Follow-up with PCP Additional Notes: Patient scheduled for follow up- new symptoms    Copied from CRM [id number]. Topic: Clinical - Red Word Triage >> Mar 18, 2023  9:53 AM Sim Boast F wrote: Red Word that prompted transfer to Nurse Triage: Patient says his feet/legs from the knee down started to swell 2 days ago, seen Dr. Jonny Ruiz 03/15/23 Reason for Disposition  [1] MODERATE leg swelling (e.g., swelling extends up to knees) AND [2] new-onset or worsening  Answer Assessment - Initial Assessment Questions 1. ONSET: "When did the swelling start?" (e.g., minutes, hours, days)     2 days- knee down swelling, patient was in the hospital for 1 week and taking fluid pill 2. LOCATION: "What part of the leg is swollen?"  "Are both legs swollen or just one leg?"     Left mostly- knee down, R is not as bad 3. SEVERITY: "How bad is the swelling?" (e.g., localized; mild, moderate, severe)   - Localized: Small area of swelling localized to one leg.   - MILD pedal edema: Swelling limited to foot and ankle, pitting edema < 1/4 inch (6 mm) deep, rest and elevation eliminate most or all swelling.   - MODERATE edema: Swelling of lower leg to knee, pitting edema > 1/4 inch (6 mm) deep, rest and elevation only partially reduce swelling.   - SEVERE edema: Swelling extends above knee, facial or hand swelling present.      moderate 4. REDNESS: "Does the swelling look red or infected?"     no 5. PAIN: "Is the swelling painful to touch?" If Yes, ask: "How painful is it?"   (Scale  1-10; mild, moderate or severe)     no 6. FEVER: "Do you have a fever?" If Yes, ask: "What is it, how was it measured, and when did it start?"      no 7. CAUSE: "What do you think is causing the leg swelling?"     Fluid build up- does go down at night- not complete 8. MEDICAL HISTORY: "Do you have a history of blood clots (e.g., DVT), cancer, heart failure, kidney disease, or liver failure?"     Recent hospitalization - electrolyte deficiency  9. RECURRENT SYMPTOM: "Have you had leg swelling before?" If Yes, ask: "When was the last time?" "What happened that time?"     6 months ago 10. OTHER SYMPTOMS: "Do you have any other symptoms?" (e.g., chest pain, difficulty breathing)       no  Protocols used: Leg Swelling and Edema-A-AH

## 2023-03-18 NOTE — Progress Notes (Signed)
 Patient ID: Phillip Shelton, male   DOB: 1934/11/19, 89 y.o.   MRN: [id number]         Chief Complaint::   Medical Management of Chronic Issues (Leg swelling and ankle in both but the left is worse has gone done some says he takes a fluid pill /)  , recent syncope, afib, hld, insomnia       HPI:  OSWELL SAY is a 89 y.o. male here for wellness exam; for shingrix at pharmacy, o/w up to date                        Also Pt denies chest pain, increased sob or doe, wheezing, orthopnea, PND, palpitations, dizziness or syncope, except swelling has worsened left leg as he tends to sit with feet on floor during daytime.   Pt denies polydipsia, polyuria, or new focal neuro s/s.    Pt denies fever, wt loss, night sweats, loss of appetite, or other constitutional symptoms, but still has increased difficulty with getting to sleep at night,  daughter takes melatonin but he is not wanting this.  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     Wt Readings from Last 3 Encounters:  03/15/23 185 lb (83.9 kg)  03/11/23 183 lb 3.2 oz (83.1 kg)  01/28/23 185 lb (83.9 kg)   BP Readings from Last 3 Encounters:  03/18/23 118/62  03/15/23 120/62  03/11/23 127/69   Immunization History  Administered Date(s) Administered   Fluad Quad(high Dose 65+) 10/04/2018, 10/02/2019, 10/01/2020, 09/15/2021   Fluad Trivalent(High Dose 65+) 09/20/2022   Influenza, High Dose Seasonal PF 10/21/2015, 10/11/2016, 10/21/2017   Influenza,inj,Quad PF,6+ Mos 10/13/2012, 10/29/2013, 11/20/2014   Influenza,inj,quad, With Preservative 10/05/2018   Influenza-Unspecified 11/04/2020   PFIZER(Purple Top)SARS-COV-2 Vaccination 02/10/2019, 03/07/2019, 10/20/2019   Pfizer Covid-19 Vaccine Bivalent Booster 57yrs & up 10/23/2020   Pneumococcal Conjugate-13 12/26/2013   Pneumococcal Polysaccharide-23 12/03/2004, 04/20/2016    Tdap 03/27/2014   Zoster, Live 07/28/2010   Health Maintenance Due  Topic Date Due   Zoster Vaccines- Shingrix (1 of 2) 12/24/1986   Medicare Annual Wellness (AWV)  10/03/2022      Past Medical History:  Diagnosis Date   Arthritis    Atrial fibrillation (HCC)    Blood in stool 02/12/2010   Qualifier: Diagnosis of  By: Misty Stanley CMA (AAMA), Amanda     BPH (benign prostatic hyperplasia) 01/18/2020   Complication of anesthesia    Diverticulosis    Esophageal stricture    GERD 02/12/2010   Qualifier: Diagnosis of  By: Misty Stanley CMA (AAMA), Amanda     GERD (gastroesophageal reflux disease)    Hemorrhoids    Hiatal hernia    Idiopathic Charcot's joint of foot 12/27/2012   Mallory - Weiss tear 1981   OAB (overactive bladder) 12/27/2012   Osteoarthritis of right hip 09/19/2014   PONV (postoperative nausea and vomiting)    Pure hypercholesterolemia 12/27/2012   Past Surgical History:  Procedure Laterality Date   CHOLECYSTECTOMY     COLONOSCOPY     GASTROCNEMIUS RECESSION  02/03/2011   Procedure: GASTROCNEMIUS SLIDE;  Surgeon: Sherri Rad, MD;  Location: [facility];  Service: Orthopedics;  Laterality: Right;   HAND SURGERY  jan 2016   rt hand   HEMORRHOID SURGERY  1990   INGUINAL HERNIA REPAIR Right  11/10/2022   Procedure: RIGHT INGUINAL HERNIA REPAIR WITH MESH;  Surgeon: Manus Rudd, MD;  Location: [facility];  Service: General;  Laterality: Right;  LMA & TAP BLOCK   TOTAL HIP ARTHROPLASTY Right 09/19/2014   Procedure: RIGHT TOTAL HIP ARTHROPLASTY ANTERIOR APPROACH;  Surgeon: Samson Frederic, MD;  Location: [facility];  Service: Orthopedics;  Laterality: Right;   UMBILICAL HERNIA REPAIR     UPPER GASTROINTESTINAL ENDOSCOPY      reports that he quit smoking about 43 years ago. His smoking use included cigarettes. He has never used smokeless tobacco. He reports that he does not currently use alcohol. He reports that he does not use drugs. family history includes  Arthritis in his father; Colon cancer in his maternal uncle; Hypertension in his father. Allergies  Allergen Reactions   Ambien [Zolpidem] Other (See Comments)    Patient did not like the effects of this, per family   Atorvastatin Other (See Comments)    "Made me feel badly and I could not sleep"- "Allergic," per facility   Current Outpatient Medications on File Prior to Visit  Medication Sig Dispense Refill   acetaminophen (TYLENOL) 500 MG tablet Take 1,000 mg by mouth 3 (three) times daily.     cyanocobalamin (VITAMIN B12) 1000 MCG tablet Take 1,000 mcg by mouth daily.     diclofenac Sodium (VOLTAREN) 1 % GEL Apply 4 g topically 4 (four) times daily. (Patient taking differently: Apply 4 g topically in the morning, at noon, in the evening, and at bedtime. Both legs) 100 g 11   finasteride (PROSCAR) 5 MG tablet Take 1 tablet (5 mg total) by mouth daily. (Patient taking differently: Take 5 mg by mouth at bedtime.) 90 tablet 3   fish oil-omega-3 fatty acids 1000 MG capsule Take 1 capsule by mouth at bedtime.     omeprazole (PRILOSEC) 20 MG capsule Take 1 capsule (20 mg total) by mouth daily. (Patient taking differently: Take 20 mg by mouth daily before breakfast.) 90 capsule 2   ondansetron (ZOFRAN) 4 MG tablet Take by mouth.     tamsulosin (FLOMAX) 0.4 MG CAPS capsule Take 1 capsule (0.4 mg total) by mouth daily after supper. 30 capsule 0   temazepam (RESTORIL) 15 MG capsule TAKE 1 CAPSULE BY MOUTH AT BEDTIME AS NEEDED FOR SLEEP. (Patient taking differently: Take 15 mg by mouth at bedtime as needed for sleep.) 30 capsule 2   metoprolol tartrate (LOPRESSOR) 25 MG tablet Take 0.5 tablets (12.5 mg total) by mouth 2 (two) times daily. 60 tablet 0   No current facility-administered medications on file prior to visit.        ROS:  All others reviewed and negative.  Objective        PE:  BP 118/62 (BP Location: Right Arm, Patient Position: Sitting, Cuff Size: Normal)   Pulse 79   Temp 98.1 F  (36.7 C) (Oral)   Ht 6' (1.829 m)   SpO2 98%   BMI 25.09 kg/m                 Constitutional: Pt appears in NAD               HENT: Head: NCAT.                Right Ear: External ear normal.                 Left Ear: External ear normal.  Eyes: . Pupils are equal, round, and reactive to light. Conjunctivae and EOM are normal               Nose: without d/c or deformity               Neck: Neck supple. Gross normal ROM               Cardiovascular: Normal rate and regular rhythm.                 Pulmonary/Chest: Effort normal and breath sounds without rales or wheezing.                Abd:  Soft, NT, ND, + BS, no organomegaly               Neurological: Pt is alert. At baseline orientation, motor grossly intact               Skin: Skin is warm. No rashes, no other new lesions, LE edema - LLE with 2+ edema to above the knee, RLE with trace to 1+, no calf tenderness               Psychiatric: Pt behavior is normal without agitation   Micro: none  Cardiac tracings I have personally interpreted today:  none  Pertinent Radiological findings (summarize): none   Lab Results  Component Value Date   WBC 5.4 03/11/2023   HGB 12.5 (L) 03/11/2023   HCT 35.9 (L) 03/11/2023   PLT 222 03/11/2023   GLUCOSE 100 (H) 03/15/2023   CHOL 193 01/28/2023   TRIG 115.0 01/28/2023   HDL 59.80 01/28/2023   LDLDIRECT 133.0 12/22/2012   LDLCALC 111 (H) 01/28/2023   ALT 19 03/11/2023   AST 26 03/11/2023   NA 137 03/15/2023   K 4.2 03/15/2023   CL 102 03/15/2023   CREATININE 0.96 03/15/2023   BUN 19 03/15/2023   CO2 28 03/15/2023   TSH 0.779 03/10/2023   INR 1.3 (H) 03/10/2023   HGBA1C 5.9 01/28/2023   MICROALBUR 3.3 (H) 12/26/2013   Assessment/Plan:  DEMONIE KASSA is a 89 y.o. White or Caucasian [1] male with  has a past medical history of Arthritis, Atrial fibrillation (HCC), Blood in stool (02/12/2010), BPH (benign prostatic hyperplasia) (01/18/2020), Complication of  anesthesia, Diverticulosis, Esophageal stricture, GERD (02/12/2010), GERD (gastroesophageal reflux disease), Hemorrhoids, Hiatal hernia, Idiopathic Charcot's joint of foot (12/27/2012), Mallory - Weiss tear (1981), OAB (overactive bladder) (12/27/2012), Osteoarthritis of right hip (09/19/2014), PONV (postoperative nausea and vomiting), and Pure hypercholesterolemia (12/27/2012).  Encounter for well adult exam with abnormal findings Age and sex appropriate education and counseling updated with regular exercise and diet Referrals for preventative services - none needed Immunizations addressed - for shingrix at pharmacy Smoking counseling  - none needed Evidence for depression or other mood disorder - none significant Most recent labs reviewed. I have personally reviewed and have noted: 1) the patient's medical and social history 2) The patient's current medications and supplements 3) The patient's height, weight, and BMI have been recorded in the chart   Edema Worsening off the HCT - to remain off given recent AKI and now low normal BP - for compression stockings bilateral daytime, leg elevation, low salt  Insomnia With persistent difficutly - ok for increased remeron 15 mg qhs  Pure hypercholesterolemia Lab Results  Component Value Date   LDLCALC 111 (H) 01/28/2023   Uncontrolled, declines statin, for lower chol diet   Vitamin D deficiency Last  vitamin D Lab Results  Component Value Date   VD25OH 37.50 01/20/2022   Low, to start oral replacement   Chronic anticoagulation Pt again encouraged to restart eliquis - refill sent  Followup: Return in about 6 months (around 09/18/2023).  Oliver Barre, MD 03/18/2023 4:30 PM [facility]  Primary Care - [facility]

## 2023-03-18 NOTE — Assessment & Plan Note (Signed)
 With persistent difficutly - ok for increased remeron 15 mg qhs

## 2023-03-18 NOTE — Assessment & Plan Note (Signed)

## 2023-03-18 NOTE — Assessment & Plan Note (Signed)
 Pt again encouraged to restart eliquis - refill sent

## 2023-03-19 ENCOUNTER — Encounter: Payer: Self-pay | Admitting: Internal Medicine

## 2023-03-19 NOTE — Assessment & Plan Note (Signed)
 Pt advised to hold on further taking HCT even low dose 12.5 mg every day, as is high risk for him if he gets otherwise ill such as this GI illness.

## 2023-03-19 NOTE — Assessment & Plan Note (Signed)
 With recent low K - for f/u lab

## 2023-03-19 NOTE — Assessment & Plan Note (Signed)
 Mild, s/p IVF - for f/u lab

## 2023-03-22 ENCOUNTER — Telehealth: Payer: Self-pay

## 2023-03-22 DIAGNOSIS — M4802 Spinal stenosis, cervical region: Secondary | ICD-10-CM | POA: Diagnosis not present

## 2023-03-22 DIAGNOSIS — I10 Essential (primary) hypertension: Secondary | ICD-10-CM | POA: Diagnosis not present

## 2023-03-22 DIAGNOSIS — I4819 Other persistent atrial fibrillation: Secondary | ICD-10-CM | POA: Diagnosis not present

## 2023-03-22 DIAGNOSIS — M14671 Charcot's joint, right ankle and foot: Secondary | ICD-10-CM | POA: Diagnosis not present

## 2023-03-22 DIAGNOSIS — D649 Anemia, unspecified: Secondary | ICD-10-CM | POA: Diagnosis not present

## 2023-03-22 NOTE — Telephone Encounter (Signed)
 Copied from CRM [id number]. Topic: Clinical - Prescription Issue >> Mar 22, 2023  2:02 PM Martinique E wrote:  Reason for CRM: Patient's daughter, Helmut Muster, called in just wanting clarification about patient's hydrochlorothiazide medication. Helmut Muster stated they held this medication at time of discharge from the hospital, but now [facility] has resumed this medication. Helmut Muster just wants to make sure patient is okay to take it or if it should be discontinued. Callback number for Helmut Muster is [phone] to discuss.

## 2023-03-22 NOTE — Telephone Encounter (Signed)
 I am not sure this is the same daughter with him at his last visit.  He was clearly given an order on a prescription pad sheet to d/c the hct.  Pt has dementia and it sounds like facility just gives in to his demand for it.

## 2023-03-23 NOTE — Telephone Encounter (Signed)
 Called and left voice mail

## 2023-03-24 DIAGNOSIS — D649 Anemia, unspecified: Secondary | ICD-10-CM | POA: Diagnosis not present

## 2023-03-24 DIAGNOSIS — I10 Essential (primary) hypertension: Secondary | ICD-10-CM | POA: Diagnosis not present

## 2023-03-24 DIAGNOSIS — M4802 Spinal stenosis, cervical region: Secondary | ICD-10-CM | POA: Diagnosis not present

## 2023-03-24 DIAGNOSIS — M14671 Charcot's joint, right ankle and foot: Secondary | ICD-10-CM | POA: Diagnosis not present

## 2023-03-24 DIAGNOSIS — I4819 Other persistent atrial fibrillation: Secondary | ICD-10-CM | POA: Diagnosis not present

## 2023-03-28 ENCOUNTER — Other Ambulatory Visit: Payer: Self-pay | Admitting: Internal Medicine

## 2023-03-29 DIAGNOSIS — I10 Essential (primary) hypertension: Secondary | ICD-10-CM | POA: Diagnosis not present

## 2023-03-29 DIAGNOSIS — M4802 Spinal stenosis, cervical region: Secondary | ICD-10-CM | POA: Diagnosis not present

## 2023-03-29 DIAGNOSIS — I4819 Other persistent atrial fibrillation: Secondary | ICD-10-CM | POA: Diagnosis not present

## 2023-03-29 DIAGNOSIS — D649 Anemia, unspecified: Secondary | ICD-10-CM | POA: Diagnosis not present

## 2023-03-29 DIAGNOSIS — M14671 Charcot's joint, right ankle and foot: Secondary | ICD-10-CM | POA: Diagnosis not present

## 2023-03-31 DIAGNOSIS — I4819 Other persistent atrial fibrillation: Secondary | ICD-10-CM | POA: Diagnosis not present

## 2023-03-31 DIAGNOSIS — D649 Anemia, unspecified: Secondary | ICD-10-CM | POA: Diagnosis not present

## 2023-03-31 DIAGNOSIS — I10 Essential (primary) hypertension: Secondary | ICD-10-CM | POA: Diagnosis not present

## 2023-03-31 DIAGNOSIS — M4802 Spinal stenosis, cervical region: Secondary | ICD-10-CM | POA: Diagnosis not present

## 2023-03-31 DIAGNOSIS — M14671 Charcot's joint, right ankle and foot: Secondary | ICD-10-CM | POA: Diagnosis not present

## 2023-03-31 NOTE — Progress Notes (Signed)
 GI panel ordered to assess for cause of diarrhea.

## 2023-04-04 DIAGNOSIS — D649 Anemia, unspecified: Secondary | ICD-10-CM | POA: Diagnosis not present

## 2023-04-04 DIAGNOSIS — M14671 Charcot's joint, right ankle and foot: Secondary | ICD-10-CM | POA: Diagnosis not present

## 2023-04-04 DIAGNOSIS — M4802 Spinal stenosis, cervical region: Secondary | ICD-10-CM | POA: Diagnosis not present

## 2023-04-04 DIAGNOSIS — I4819 Other persistent atrial fibrillation: Secondary | ICD-10-CM | POA: Diagnosis not present

## 2023-04-04 DIAGNOSIS — I10 Essential (primary) hypertension: Secondary | ICD-10-CM | POA: Diagnosis not present

## 2023-04-07 ENCOUNTER — Telehealth: Payer: Self-pay | Admitting: Internal Medicine

## 2023-04-07 NOTE — Telephone Encounter (Signed)
 Ok for [employer]

## 2023-04-07 NOTE — Telephone Encounter (Unsigned)
 Copied from CRM [id number]. Topic: Clinical - Home Health Verbal Orders >> Apr 07, 2023 10:09 AM Myrtice Lauth wrote: Caller/Agency: [facility]  [facility] Callback Number: [phone] Service Requested: Physical Therapy Frequency: 1 week1  2 week2 1 week 2 soc 3/11 2025 requesting to back date the approval. Sent fax  Any new concerns about the patient? No

## 2023-04-08 NOTE — Telephone Encounter (Signed)
 Called and gave verbals.

## 2023-04-12 DIAGNOSIS — I10 Essential (primary) hypertension: Secondary | ICD-10-CM | POA: Diagnosis not present

## 2023-04-12 DIAGNOSIS — D649 Anemia, unspecified: Secondary | ICD-10-CM | POA: Diagnosis not present

## 2023-04-12 DIAGNOSIS — I4819 Other persistent atrial fibrillation: Secondary | ICD-10-CM | POA: Diagnosis not present

## 2023-04-12 DIAGNOSIS — M4802 Spinal stenosis, cervical region: Secondary | ICD-10-CM | POA: Diagnosis not present

## 2023-04-12 DIAGNOSIS — M14671 Charcot's joint, right ankle and foot: Secondary | ICD-10-CM | POA: Diagnosis not present

## 2023-04-13 DIAGNOSIS — M6281 Muscle weakness (generalized): Secondary | ICD-10-CM | POA: Diagnosis not present

## 2023-04-13 DIAGNOSIS — R278 Other lack of coordination: Secondary | ICD-10-CM | POA: Diagnosis not present

## 2023-04-14 DIAGNOSIS — M6281 Muscle weakness (generalized): Secondary | ICD-10-CM | POA: Diagnosis not present

## 2023-04-14 DIAGNOSIS — R2689 Other abnormalities of gait and mobility: Secondary | ICD-10-CM | POA: Diagnosis not present

## 2023-04-19 DIAGNOSIS — M6281 Muscle weakness (generalized): Secondary | ICD-10-CM | POA: Diagnosis not present

## 2023-04-21 DIAGNOSIS — M6281 Muscle weakness (generalized): Secondary | ICD-10-CM | POA: Diagnosis not present

## 2023-04-21 DIAGNOSIS — R278 Other lack of coordination: Secondary | ICD-10-CM | POA: Diagnosis not present

## 2023-04-27 DIAGNOSIS — R278 Other lack of coordination: Secondary | ICD-10-CM | POA: Diagnosis not present

## 2023-04-27 DIAGNOSIS — M6281 Muscle weakness (generalized): Secondary | ICD-10-CM | POA: Diagnosis not present

## 2023-04-29 DIAGNOSIS — M6281 Muscle weakness (generalized): Secondary | ICD-10-CM | POA: Diagnosis not present

## 2023-05-02 DIAGNOSIS — R278 Other lack of coordination: Secondary | ICD-10-CM | POA: Diagnosis not present

## 2023-05-02 DIAGNOSIS — M6281 Muscle weakness (generalized): Secondary | ICD-10-CM | POA: Diagnosis not present

## 2023-05-04 DIAGNOSIS — M6281 Muscle weakness (generalized): Secondary | ICD-10-CM | POA: Diagnosis not present

## 2023-05-06 DIAGNOSIS — M6281 Muscle weakness (generalized): Secondary | ICD-10-CM | POA: Diagnosis not present

## 2023-05-10 DIAGNOSIS — M6281 Muscle weakness (generalized): Secondary | ICD-10-CM | POA: Diagnosis not present

## 2023-05-14 DIAGNOSIS — M6281 Muscle weakness (generalized): Secondary | ICD-10-CM | POA: Diagnosis not present

## 2023-05-14 DIAGNOSIS — R278 Other lack of coordination: Secondary | ICD-10-CM | POA: Diagnosis not present

## 2023-05-19 DIAGNOSIS — M6281 Muscle weakness (generalized): Secondary | ICD-10-CM | POA: Diagnosis not present

## 2023-05-20 DIAGNOSIS — R3912 Poor urinary stream: Secondary | ICD-10-CM | POA: Diagnosis not present

## 2023-05-21 DIAGNOSIS — R278 Other lack of coordination: Secondary | ICD-10-CM | POA: Diagnosis not present

## 2023-05-21 DIAGNOSIS — M6281 Muscle weakness (generalized): Secondary | ICD-10-CM | POA: Diagnosis not present

## 2023-05-25 DIAGNOSIS — M6281 Muscle weakness (generalized): Secondary | ICD-10-CM | POA: Diagnosis not present

## 2023-06-04 DIAGNOSIS — R278 Other lack of coordination: Secondary | ICD-10-CM | POA: Diagnosis not present

## 2023-06-04 DIAGNOSIS — M6281 Muscle weakness (generalized): Secondary | ICD-10-CM | POA: Diagnosis not present

## 2023-06-07 DIAGNOSIS — M6281 Muscle weakness (generalized): Secondary | ICD-10-CM | POA: Diagnosis not present

## 2023-06-09 DIAGNOSIS — M6281 Muscle weakness (generalized): Secondary | ICD-10-CM | POA: Diagnosis not present

## 2023-06-15 DIAGNOSIS — M6281 Muscle weakness (generalized): Secondary | ICD-10-CM | POA: Diagnosis not present

## 2023-06-21 DIAGNOSIS — M6281 Muscle weakness (generalized): Secondary | ICD-10-CM | POA: Diagnosis not present

## 2023-06-23 DIAGNOSIS — M6281 Muscle weakness (generalized): Secondary | ICD-10-CM | POA: Diagnosis not present

## 2023-07-04 ENCOUNTER — Telehealth: Payer: Self-pay | Admitting: Internal Medicine

## 2023-07-04 NOTE — Telephone Encounter (Signed)
 Copied from CRM [id number]. Topic: Clinical - Medication Question >> Jul 04, 2023  3:02 PM Mesmerise C wrote: Reason for CRM: Vakiyyah from [facility] needing script to be faxed over for patient's temazepam  (RESTORIL ) 15 MG capsule fax is [phone]

## 2023-07-05 MED ORDER — TEMAZEPAM 15 MG PO CAPS: 15.0000 mg | ORAL_CAPSULE | Freq: Every evening | ORAL | 1 refills | Status: DC | PRN

## 2023-07-05 NOTE — Telephone Encounter (Signed)
 Ok done erx

## 2023-07-22 ENCOUNTER — Ambulatory Visit

## 2023-07-28 ENCOUNTER — Ambulatory Visit: Payer: Medicare Other | Admitting: Internal Medicine

## 2023-07-28 ENCOUNTER — Encounter: Payer: Self-pay | Admitting: Internal Medicine

## 2023-07-28 ENCOUNTER — Ambulatory Visit: Payer: Self-pay | Admitting: Internal Medicine

## 2023-07-28 VITALS — BP 124/68 | HR 77 | Temp 97.7°F | Ht 72.0 in | Wt 189.4 lb

## 2023-07-28 DIAGNOSIS — E7800 Pure hypercholesterolemia, unspecified: Secondary | ICD-10-CM

## 2023-07-28 DIAGNOSIS — E876 Hypokalemia: Secondary | ICD-10-CM

## 2023-07-28 DIAGNOSIS — R609 Edema, unspecified: Secondary | ICD-10-CM

## 2023-07-28 DIAGNOSIS — E559 Vitamin D deficiency, unspecified: Secondary | ICD-10-CM

## 2023-07-28 LAB — CBC WITH DIFFERENTIAL/PLATELET
Basophils Absolute: 0 K/uL (ref 0.0–0.1)
Basophils Relative: 0.5 % (ref 0.0–3.0)
Eosinophils Absolute: 0.1 K/uL (ref 0.0–0.7)
Eosinophils Relative: 1.9 % (ref 0.0–5.0)
HCT: 37.5 % — ABNORMAL LOW (ref 39.0–52.0)
Hemoglobin: 12.7 g/dL — ABNORMAL LOW (ref 13.0–17.0)
Lymphocytes Relative: 27.2 % (ref 12.0–46.0)
Lymphs Abs: 1.5 K/uL (ref 0.7–4.0)
MCHC: 33.8 g/dL (ref 30.0–36.0)
MCV: 92.3 fl (ref 78.0–100.0)
Monocytes Absolute: 0.7 K/uL (ref 0.1–1.0)
Monocytes Relative: 13.8 % — ABNORMAL HIGH (ref 3.0–12.0)
Neutro Abs: 3 K/uL (ref 1.4–7.7)
Neutrophils Relative %: 56.6 % (ref 43.0–77.0)
Platelets: 194 K/uL (ref 150.0–400.0)
RBC: 4.07 Mil/uL — ABNORMAL LOW (ref 4.22–5.81)
RDW: 13.9 % (ref 11.5–15.5)
WBC: 5.3 K/uL (ref 4.0–10.5)

## 2023-07-28 LAB — HEPATIC FUNCTION PANEL
ALT: 10 U/L (ref 0–53)
AST: 15 U/L (ref 0–37)
Albumin: 4.6 g/dL (ref 3.5–5.2)
Alkaline Phosphatase: 62 U/L (ref 39–117)
Bilirubin, Direct: 0.2 mg/dL (ref 0.0–0.3)
Total Bilirubin: 0.8 mg/dL (ref 0.2–1.2)
Total Protein: 7.2 g/dL (ref 6.0–8.3)

## 2023-07-28 LAB — BASIC METABOLIC PANEL WITH GFR
BUN: 18 mg/dL (ref 6–23)
CO2: 27 meq/L (ref 19–32)
Calcium: 9.5 mg/dL (ref 8.4–10.5)
Chloride: 103 meq/L (ref 96–112)
Creatinine, Ser: 0.96 mg/dL (ref 0.40–1.50)
GFR: 71.53 mL/min (ref >60.00)
Glucose, Bld: 107 mg/dL — ABNORMAL HIGH (ref 70–99)
Potassium: 4.5 meq/L (ref 3.5–5.1)
Sodium: 137 meq/L (ref 135–145)

## 2023-07-28 LAB — VITAMIN D 25 HYDROXY (VIT D DEFICIENCY, FRACTURES): VITD: 35.99 ng/mL (ref 30.00–100.00)

## 2023-07-28 MED ORDER — HYDROCHLOROTHIAZIDE 12.5 MG PO CAPS: 12.5000 mg | ORAL_CAPSULE | Freq: Every day | ORAL | 3 refills | Status: DC

## 2023-07-28 NOTE — Assessment & Plan Note (Addendum)
 Mild ankle edema, for leg elevation, low salt diet, compression stockings; and restart hct 12.5 mg qd

## 2023-07-28 NOTE — Assessment & Plan Note (Signed)
 Lab Results  Component Value Date   LDLCALC 111 (H) 01/28/2023   Ucontrolled, for lower chol diet, declines statin

## 2023-07-28 NOTE — Patient Instructions (Addendum)
 Please take all new medication as prescribed - the HCT 12.5 mg qd  Please continue all other medications as before  Please have the pharmacy call with any other refills you may need.  Please continue your efforts at being more active, low cholesterol diet, and weight control.  Please keep your appointments with your specialists as you may have planned  Please go to the LAB at the blood drawing area for the tests to be done  You will be contacted by phone if any changes need to be made immediately.  Otherwise, you will receive a letter about your results with an explanation, but please check with MyChart first.  Please make an Appointment to return in 6 months, or sooner if needed

## 2023-07-28 NOTE — Assessment & Plan Note (Signed)
Also for f/u lab today

## 2023-07-28 NOTE — Assessment & Plan Note (Signed)
 Last vitamin D  Lab Results  Component Value Date   VD25OH 35.99 07/28/2023   Low, to start oral replacement

## 2023-07-28 NOTE — Progress Notes (Signed)
 Patient ID: Phillip Shelton, male   DOB: Jul 30, 1934, 89 y.o.   MRN: [id number]        Chief Complaint: follow up leg swelling, low K, recent diarrhea, hld, low vit d    HPI:  Phillip Shelton is a 89 y.o. male here overall doing ok,  Pt denies chest pain, increased sob or doe, wheezing, orthopnea, PND, increased LE swelling, palpitations, dizziness or syncope.   Pt denies polydipsia, polyuria, or new focal neuro s/s.          No further diarrhea or weakness, falls since mar 2025 episode infectious related (GI bug from the assisted living), with syncope.  Potassium was replaced.  Still has ankle edema bilat, goes away at night, back later the next day.  No recent falls.   Wt Readings from Last 3 Encounters:  07/28/23 189 lb 6.4 oz (85.9 kg)  03/15/23 185 lb (83.9 kg)  03/11/23 183 lb 3.2 oz (83.1 kg)   BP Readings from Last 3 Encounters:  07/28/23 124/68  03/18/23 118/62  03/15/23 120/62         Past Medical History:  Diagnosis Date   Arthritis    Atrial fibrillation (HCC)    Blood in stool 02/12/2010   Qualifier: Diagnosis of  By: Lewellyn CMA (AAMA), Amanda     BPH (benign prostatic hyperplasia) 01/18/2020   Complication of anesthesia    Diverticulosis    Esophageal stricture    GERD 02/12/2010   Qualifier: Diagnosis of  By: Earlean CMA (AAMA), Alan     GERD (gastroesophageal reflux disease)    Hemorrhoids    Hiatal hernia    Idiopathic Charcot's joint of foot 12/27/2012   Mallory - Weiss tear 1981   OAB (overactive bladder) 12/27/2012   Osteoarthritis of right hip 09/19/2014   PONV (postoperative nausea and vomiting)    Pure hypercholesterolemia 12/27/2012   Past Surgical History:  Procedure Laterality Date   CHOLECYSTECTOMY     COLONOSCOPY     GASTROCNEMIUS RECESSION  02/03/2011   Procedure: GASTROCNEMIUS SLIDE;  Surgeon: Deward DELENA Schwartz, MD;  Location: [facility];  Service: Orthopedics;  Laterality: Right;   HAND SURGERY  jan 2016   rt hand    HEMORRHOID SURGERY  1990   INGUINAL HERNIA REPAIR Right 11/10/2022   Procedure: RIGHT INGUINAL HERNIA REPAIR WITH MESH;  Surgeon: Belinda Cough, MD;  Location: [facility];  Service: General;  Laterality: Right;  LMA & TAP BLOCK   TOTAL HIP ARTHROPLASTY Right 09/19/2014   Procedure: RIGHT TOTAL HIP ARTHROPLASTY ANTERIOR APPROACH;  Surgeon: Redell Shoals, MD;  Location: [facility];  Service: Orthopedics;  Laterality: Right;   UMBILICAL HERNIA REPAIR     UPPER GASTROINTESTINAL ENDOSCOPY      reports that he quit smoking about 43 years ago. His smoking use included cigarettes. He has never used smokeless tobacco. He reports that he does not currently use alcohol . He reports that he does not use drugs. family history includes Arthritis in his father; Colon cancer in his maternal uncle; Hypertension in his father. Allergies  Allergen Reactions   Ambien [Zolpidem] Other (See Comments)    Patient did not like the effects of this, per family   Atorvastatin  Other (See Comments)    Made me feel badly and I could not sleep- Allergic, per facility   Current Outpatient Medications on File Prior to Visit  Medication Sig Dispense Refill   acetaminophen  (TYLENOL ) 500 MG tablet Take 1,000 mg by mouth 3 (three)  times daily.     apixaban  (ELIQUIS ) 5 MG TABS tablet Take 1 tablet (5 mg total) by mouth 2 (two) times daily. 180 tablet 3   cyanocobalamin  (VITAMIN B12) 1000 MCG tablet Take 1,000 mcg by mouth daily.     diclofenac  Sodium (VOLTAREN ) 1 % GEL Apply 4 g topically 4 (four) times daily. (Patient taking differently: Apply 4 g topically in the morning, at noon, in the evening, and at bedtime. Both legs) 100 g 11   finasteride  (PROSCAR ) 5 MG tablet Take 1 tablet (5 mg total) by mouth daily. (Patient taking differently: Take 5 mg by mouth at bedtime.) 90 tablet 3   fish oil-omega-3 fatty acids 1000 MG capsule Take 1 capsule by mouth at bedtime.     metoprolol  tartrate (LOPRESSOR ) 25 MG tablet Take 0.5 tablets (12.5  mg total) by mouth 2 (two) times daily. 60 tablet 0   mirtazapine  (REMERON  SOL-TAB) 15 MG disintegrating tablet Take 1 tablet (15 mg total) by mouth at bedtime. 90 tablet 3   omeprazole  (PRILOSEC) 20 MG capsule Take 1 capsule (20 mg total) by mouth daily. (Patient taking differently: Take 20 mg by mouth daily before breakfast.) 90 capsule 2   ondansetron  (ZOFRAN ) 4 MG tablet Take by mouth.     tamsulosin  (FLOMAX ) 0.4 MG CAPS capsule Take 1 capsule (0.4 mg total) by mouth daily after supper. 30 capsule 0   temazepam  (RESTORIL ) 15 MG capsule Take 1 capsule (15 mg total) by mouth at bedtime as needed. for sleep 90 capsule 1   No current facility-administered medications on file prior to visit.        ROS:  All others reviewed and negative.  Objective        PE:  BP 124/68   Pulse 77   Temp 97.7 F (36.5 C)   Ht 6' (1.829 m)   Wt 189 lb 6.4 oz (85.9 kg)   SpO2 93%   BMI 25.69 kg/m                 Constitutional: Pt appears in NAD               HENT: Head: NCAT.                Right Ear: External ear normal.                 Left Ear: External ear normal.                Eyes: . Pupils are equal, round, and reactive to light. Conjunctivae and EOM are normal               Nose: without d/c or deformity               Neck: Neck supple. Gross normal ROM               Cardiovascular: Normal rate and regular rhythm.                 Pulmonary/Chest: Effort normal and breath sounds without rales or wheezing.                Abd:  Soft, NT, ND, + BS, no organomegaly               Neurological: Pt is alert. At baseline orientation, motor grossly intact               Skin: Skin is warm. No rashes, no other  new lesions, LE edema - trace to 1+ bilat ankle edema               Psychiatric: Pt behavior is normal without agitation   Micro: none  Cardiac tracings I have personally interpreted today:  none  Pertinent Radiological findings (summarize): none   Lab Results  Component Value Date   WBC  5.3 07/28/2023   HGB 12.7 (L) 07/28/2023   HCT 37.5 (L) 07/28/2023   PLT 194.0 07/28/2023   GLUCOSE 107 (H) 07/28/2023   CHOL 193 01/28/2023   TRIG 115.0 01/28/2023   HDL 59.80 01/28/2023   LDLDIRECT 133.0 12/22/2012   LDLCALC 111 (H) 01/28/2023   ALT 10 07/28/2023   AST 15 07/28/2023   NA 137 07/28/2023   K 4.5 07/28/2023   CL 103 07/28/2023   CREATININE 0.96 07/28/2023   BUN 18 07/28/2023   CO2 27 07/28/2023   TSH 0.779 03/10/2023   INR 1.3 (H) 03/10/2023   HGBA1C 5.9 01/28/2023   Assessment/Plan:  DEONDRAE MCGRAIL is a 89 y.o. White or Caucasian [1] male with  has a past medical history of Arthritis, Atrial fibrillation (HCC), Blood in stool (02/12/2010), BPH (benign prostatic hyperplasia) (01/18/2020), Complication of anesthesia, Diverticulosis, Esophageal stricture, GERD (02/12/2010), GERD (gastroesophageal reflux disease), Hemorrhoids, Hiatal hernia, Idiopathic Charcot's joint of foot (12/27/2012), Mallory - Weiss tear (1981), OAB (overactive bladder) (12/27/2012), Osteoarthritis of right hip (09/19/2014), PONV (postoperative nausea and vomiting), and Pure hypercholesterolemia (12/27/2012).  Edema Mild ankle edema, for leg elevation, low salt diet, compression stockings; and restart hct 12.5 mg qd  Hypokalemia Also for f/u lab today  Pure hypercholesterolemia Lab Results  Component Value Date   LDLCALC 111 (H) 01/28/2023   Ucontrolled, for lower chol diet, declines statin   Vitamin D  deficiency Last vitamin D  Lab Results  Component Value Date   VD25OH 35.99 07/28/2023   Low, to start oral replacement  Followup: Return in about 6 months (around 01/28/2024).  Lynwood Rush, MD 07/28/2023 8:43 PM [facility]

## 2023-08-01 DIAGNOSIS — D692 Other nonthrombocytopenic purpura: Secondary | ICD-10-CM | POA: Diagnosis not present

## 2023-08-01 DIAGNOSIS — D1801 Hemangioma of skin and subcutaneous tissue: Secondary | ICD-10-CM | POA: Diagnosis not present

## 2023-08-01 DIAGNOSIS — L821 Other seborrheic keratosis: Secondary | ICD-10-CM | POA: Diagnosis not present

## 2023-08-01 DIAGNOSIS — L570 Actinic keratosis: Secondary | ICD-10-CM | POA: Diagnosis not present

## 2023-08-01 DIAGNOSIS — Z85828 Personal history of other malignant neoplasm of skin: Secondary | ICD-10-CM | POA: Diagnosis not present

## 2023-08-19 ENCOUNTER — Ambulatory Visit: Payer: Self-pay | Admitting: Surgery

## 2023-08-19 ENCOUNTER — Ambulatory Visit (INDEPENDENT_AMBULATORY_CARE_PROVIDER_SITE_OTHER)

## 2023-08-19 VITALS — BP 100/70 | HR 81 | Ht 76.0 in | Wt 188.0 lb

## 2023-08-19 DIAGNOSIS — Z0000 Encounter for general adult medical examination without abnormal findings: Secondary | ICD-10-CM | POA: Diagnosis not present

## 2023-08-19 DIAGNOSIS — K4090 Unilateral inguinal hernia, without obstruction or gangrene, not specified as recurrent: Secondary | ICD-10-CM | POA: Diagnosis not present

## 2023-08-19 NOTE — Patient Instructions (Signed)
 Mr. Phillip Shelton , Thank you for taking time out of your busy schedule to complete your Annual Wellness Visit with me. I enjoyed our conversation and look forward to speaking with you again next year. I, as well as your care team,  appreciate your ongoing commitment to your health goals. Please review the following plan we discussed and let me know if I can assist you in the future. Your Game plan/ To Do List    Follow up Visits: We will see or speak with you next year for your Next Medicare AWV with our clinical staff Have you seen your provider in the last 6 months (3 months if uncontrolled diabetes)? Yes.  Last Office visit on 07/28/2023.  Clinician Recommendations:  Aim for 30 minutes of exercise or brisk walking, 6-8 glasses of water, and 5 servings of fruits and vegetables each day. Stay positive.      This is a list of the screenings recommended for you:  Health Maintenance  Topic Date Due   Zoster (Shingles) Vaccine (1 of 2) 12/24/1986   Medicare Annual Wellness Visit  10/03/2022   Flu Shot  08/05/2023   DTaP/Tdap/Td vaccine (2 - Td or Tdap) 03/26/2024   Pneumococcal Vaccine for age over 59  Completed   HPV Vaccine  Aged Out   Meningitis B Vaccine  Aged Out   COVID-19 Vaccine  Discontinued    Advanced directives: (In Chart) A copy of your advanced directives are scanned into your chart should your provider ever need it. Advance Care Planning is important because it:  [x]  Makes sure you receive the medical care that is consistent with your values, goals, and preferences  [x]  It provides guidance to your family and loved ones and reduces their decisional burden about whether or not they are making the right decisions based on your wishes.  Follow the link provided in your after visit summary or read over the paperwork we have mailed to you to help you started getting your Advance Directives in place. If you need assistance in completing these, please reach out to us  so that we can help  you!  See attachments for Preventive Care and Fall Prevention Tips.

## 2023-08-19 NOTE — Progress Notes (Signed)
 Subjective:   Phillip Shelton is a 89 y.o. who presents for a Medicare Wellness preventive visit.  As a reminder, Annual Wellness Visits don't include a physical exam, and some assessments may be limited, especially if this visit is performed virtually. We may recommend an in-person follow-up visit with your provider if needed.  Visit Complete: In person  Persons Participating in Visit: Patient.  AWV Questionnaire: No: Patient Medicare AWV questionnaire was not completed prior to this visit.  Cardiac Risk Factors include: advanced age (>27men, >27 women);male gender;Other (see comment);dyslipidemia, Risk factor comments: BPH, A-fib, AKI     Objective:    Today's Vitals   08/19/23 1113  BP: 100/70  Pulse: 81  SpO2: 97%  Weight: 188 lb (85.3 kg)  Height: 6' 4 (1.93 m)   Body mass index is 22.88 kg/m.     08/19/2023   11:22 AM 03/10/2023   10:07 AM 11/10/2022   11:00 AM 11/04/2022    1:17 PM 08/06/2022    8:50 PM 10/02/2021   12:43 PM 09/22/2021    1:14 PM  Advanced Directives  Does Patient Have a Medical Advance Directive? Yes No No No Yes No No  Type of Estate agent of [place];Living will    Healthcare Power of Attorney    Does patient want to make changes to medical advance directive? No - Patient declined    No - Patient declined    Copy of Healthcare Power of Attorney in Chart? Yes - validated most recent copy scanned in chart (See row information)    No - copy requested    Would patient like information on creating a medical advance directive?  No - Patient declined No - Patient declined No - Patient declined  No - Patient declined No - Patient declined    Current Medications (verified) Outpatient Encounter Medications as of 08/19/2023  Medication Sig   acetaminophen  (TYLENOL ) 500 MG tablet Take 1,000 mg by mouth 3 (three) times daily.   apixaban  (ELIQUIS ) 5 MG TABS tablet Take 1 tablet (5 mg total) by mouth 2 (two) times daily.   cyanocobalamin   (VITAMIN B12) 1000 MCG tablet Take 1,000 mcg by mouth daily.   diclofenac  Sodium (VOLTAREN ) 1 % GEL Apply 4 g topically 4 (four) times daily. (Patient taking differently: Apply 4 g topically in the morning, at noon, in the evening, and at bedtime. Both legs)   finasteride  (PROSCAR ) 5 MG tablet Take 1 tablet (5 mg total) by mouth daily. (Patient taking differently: Take 5 mg by mouth at bedtime.)   fish oil-omega-3 fatty acids 1000 MG capsule Take 1 capsule by mouth at bedtime.   hydrochlorothiazide  (MICROZIDE ) 12.5 MG capsule Take 1 capsule (12.5 mg total) by mouth daily.   metoprolol  tartrate (LOPRESSOR ) 25 MG tablet Take 0.5 tablets (12.5 mg total) by mouth 2 (two) times daily.   mirtazapine  (REMERON  SOL-TAB) 15 MG disintegrating tablet Take 1 tablet (15 mg total) by mouth at bedtime.   omeprazole  (PRILOSEC) 20 MG capsule Take 1 capsule (20 mg total) by mouth daily. (Patient taking differently: Take 20 mg by mouth daily before breakfast.)   ondansetron  (ZOFRAN ) 4 MG tablet Take by mouth.   tamsulosin  (FLOMAX ) 0.4 MG CAPS capsule Take 1 capsule (0.4 mg total) by mouth daily after supper.   temazepam  (RESTORIL ) 15 MG capsule Take 1 capsule (15 mg total) by mouth at bedtime as needed. for sleep   No facility-administered encounter medications on file as of 08/19/2023.  Allergies (verified) Ambien [zolpidem] and Atorvastatin   History: Past Medical History:  Diagnosis Date   Arthritis    Atrial fibrillation (HCC)    Blood in stool 02/12/2010   Qualifier: Diagnosis of  By: Lewellyn CMA (AAMA), Amanda     BPH (benign prostatic hyperplasia) 01/18/2020   Complication of anesthesia    Diverticulosis    Esophageal stricture    GERD 02/12/2010   Qualifier: Diagnosis of  By: Lewellyn CMA (AAMA), Alan     GERD (gastroesophageal reflux disease)    Hemorrhoids    Hiatal hernia    Idiopathic Charcot's joint of foot 12/27/2012   Mallory - Weiss tear 1981   OAB (overactive bladder) 12/27/2012    Osteoarthritis of right hip 09/19/2014   PONV (postoperative nausea and vomiting)    Pure hypercholesterolemia 12/27/2012   Past Surgical History:  Procedure Laterality Date   CHOLECYSTECTOMY     COLONOSCOPY     GASTROCNEMIUS RECESSION  02/03/2011   Procedure: GASTROCNEMIUS SLIDE;  Surgeon: Deward DELENA Schwartz, MD;  Location: [facility];  Service: Orthopedics;  Laterality: Right;   HAND SURGERY  jan 2016   rt hand   HEMORRHOID SURGERY  1990   INGUINAL HERNIA REPAIR Right 11/10/2022   Procedure: RIGHT INGUINAL HERNIA REPAIR WITH MESH;  Surgeon: Belinda Cough, MD;  Location: [facility];  Service: General;  Laterality: Right;  LMA & TAP BLOCK   TOTAL HIP ARTHROPLASTY Right 09/19/2014   Procedure: RIGHT TOTAL HIP ARTHROPLASTY ANTERIOR APPROACH;  Surgeon: Redell Shoals, MD;  Location: [facility];  Service: Orthopedics;  Laterality: Right;   UMBILICAL HERNIA REPAIR     UPPER GASTROINTESTINAL ENDOSCOPY     Family History  Problem Relation Age of Onset   Arthritis Father    Hypertension Father    Colon cancer Maternal Uncle    Diabetes Neg Hx    Heart disease Neg Hx    Stomach cancer Neg Hx    Rectal cancer Neg Hx    Social History   Socioeconomic History   Marital status: Married    Spouse name: Khawaja   Number of children: 0   Years of education: Not on file   Highest education level: Not on file  Occupational History   Occupation: Retired-worked for [employer]  Tobacco Use   Smoking status: Former    Current packs/day: 0.00    Types: Cigarettes    Quit date: 01/29/1980    Years since quitting: 43.5   Smokeless tobacco: Never  Vaping Use   Vaping status: Never Used  Substance and Sexual Activity   Alcohol use: Not Currently   Drug use: No   Sexual activity: Not on file  Other Topics Concern   Not on file  Social History Narrative   Lives with wife in Geophysicist/field seismologist living[facility]   Social Drivers of Health   Financial Resource Strain: Low Risk  (10/02/2021)   Overall  Financial Resource Strain (CARDIA)    Difficulty of Paying Living Expenses: Not hard at all  Food Insecurity: No Food Insecurity (03/10/2023)   Hunger Vital Sign    Worried About Running Out of Food in the Last Year: Never true    Ran Out of Food in the Last Year: Never true  Transportation Needs: No Transportation Needs (08/19/2023)   PRAPARE - Administrator, Civil Service (Medical): No    Lack of Transportation (Non-Medical): No  Physical Activity: Inactive (08/19/2023)   Exercise Vital Sign    Days of Exercise  per Week: 0 days    Minutes of Exercise per Session: 0 min  Stress: No Stress Concern Present (08/19/2023)   [employer] of Occupational Health - Occupational Stress Questionnaire    Feeling of Stress: Not at all  Social Connections: Moderately Isolated (08/19/2023)   Social Connection and Isolation Panel    Frequency of Communication with Friends and Family: More than three times a week    Frequency of Social Gatherings with Friends and Family: More than three times a week    Attends Religious Services: Never    Database administrator or Organizations: No    Attends Engineer, structural: Never    Marital Status: Married    Tobacco Counseling Counseling given: Not Answered    Clinical Intake:  Pre-visit preparation completed: Yes  Pain : No/denies pain     BMI - recorded: 22.88 Nutritional Status: BMI of 19-24  Normal Nutritional Risks: None  Lab Results  Component Value Date   HGBA1C 5.9 01/28/2023   HGBA1C 5.6 09/20/2022   HGBA1C 5.6 01/20/2022     How often do you need to have someone help you when you read instructions, pamphlets, or other written materials from your doctor or pharmacy?: 1 - Never     Information entered by :: Elim Peale, RMA   Activities of Daily Living     08/19/2023   11:11 AM 11/10/2022   11:00 AM  In your present state of health, do you have any difficulty performing the following activities:   Hearing? 1 0  Comment Wears hearing aides   Vision? 0 0  Difficulty concentrating or making decisions? 0 0  Walking or climbing stairs? 0   Dressing or bathing? 0   Doing errands, shopping? 0 0  Comment has family members drive him   Preparing Food and eating ? N   Using the Toilet? N   In the past six months, have you accidently leaked urine? N   Do you have problems with loss of bowel control? N   Managing your Medications? N   Managing your Finances? N   Housekeeping or managing your Housekeeping? N     Patient Care Team: Norleen Lynwood ORN, MD as PCP - General (Internal Medicine) Verlin Lonni BIRCH, MD as PCP - Cardiology (Cardiology) Waylan Cain, MD as Consulting Physician (Ophthalmology)  I have updated your Care Teams any recent Medical Services you may have received from other providers in the past year.     Assessment:   This is a routine wellness examination for [facility].  Hearing/Vision screen Hearing Screening - Comments:: Wears hearing aides Vision Screening - Comments:: Denies vision issues.    Goals Addressed   None    Depression Screen     08/19/2023   11:25 AM 03/18/2023    3:39 PM 03/15/2023   10:46 AM 01/28/2023    1:18 PM 09/20/2022    2:31 PM 07/21/2022    1:38 PM 01/20/2022    2:14 PM  PHQ 2/9 Scores  PHQ - 2 Score 3 0 0 0 0 0 1  PHQ- 9 Score 6 0 0 0       Fall Risk     08/19/2023   11:23 AM 03/18/2023    3:46 PM 03/15/2023   10:54 AM 01/28/2023    1:18 PM 09/20/2022    2:31 PM  Fall Risk   Falls in the past year? 0 1 1 0 0  Number falls in past yr: 0 0  0 0 0  Injury with Fall? 0 0 0 0 0  Risk for fall due to :  History of fall(s) History of fall(s) No Fall Risks No Fall Risks  Follow up Falls evaluation completed;Falls prevention discussed Falls evaluation completed Falls evaluation completed Falls evaluation completed Falls evaluation completed    MEDICARE RISK AT HOME:  Medicare Risk at Home Any stairs in or around the home?: No  (lives in assistant living) If so, are there any without handrails?: No Home free of loose throw rugs in walkways, pet beds, electrical cords, etc?: Yes Adequate lighting in your home to reduce risk of falls?: Yes Life alert?: No Use of a cane, walker or w/c?: Yes (walker) Grab bars in the bathroom?: Yes Shower chair or bench in shower?: Yes Elevated toilet seat or a handicapped toilet?: Yes  TIMED UP AND GO:  Was the test performed?  Yes  Length of time to ambulate 10 feet: 30 sec Gait slow and steady without use of assistive device  Cognitive Function: Declined/Normal: No cognitive concerns noted by patient or family. Patient alert, oriented, able to answer questions appropriately and recall recent events. No signs of memory loss or confusion.        10/02/2021   12:44 PM  6CIT Screen  What Year? 0 points  What month? 0 points  What time? 0 points  Count back from 20 2 points  Months in reverse 2 points  Repeat phrase 2 points  Total Score 6 points    Immunizations Immunization History  Administered Date(s) Administered   Fluad Quad(high Dose 65+) 10/04/2018, 10/02/2019, 10/01/2020, 09/15/2021   Fluad Trivalent(High Dose 65+) 09/20/2022   Influenza, High Dose Seasonal PF 10/21/2015, 10/11/2016, 10/21/2017   Influenza,inj,Quad PF,6+ Mos 10/13/2012, 10/29/2013, 11/20/2014   Influenza,inj,quad, With Preservative 10/05/2018   Influenza-Unspecified 11/04/2020   PFIZER(Purple Top)SARS-COV-2 Vaccination 02/10/2019, 03/07/2019, 10/20/2019   Pfizer Covid-19 Vaccine Bivalent Booster 67yrs & up 10/23/2020   Pneumococcal Conjugate-13 12/26/2013   Pneumococcal Polysaccharide-23 12/03/2004, 04/20/2016   Tdap 03/27/2014   Zoster, Live 07/28/2010    Screening Tests Health Maintenance  Topic Date Due   Zoster Vaccines- Shingrix (1 of 2) 12/24/1986   INFLUENZA VACCINE  08/05/2023   DTaP/Tdap/Td (2 - Td or Tdap) 03/26/2024   Medicare Annual Wellness (AWV)  08/18/2024    Pneumococcal Vaccine: 50+ Years  Completed   HPV VACCINES  Aged Out   Meningococcal B Vaccine  Aged Out   COVID-19 Vaccine  Discontinued    Health Maintenance  Health Maintenance Due  Topic Date Due   Zoster Vaccines- Shingrix (1 of 2) 12/24/1986   INFLUENZA VACCINE  08/05/2023   Health Maintenance Items Addressed: See Nurse Notes at the end of this note  Additional Screening:  Vision Screening: Recommended annual ophthalmology exams for early detection of glaucoma and other disorders of the eye. Would you like a referral to an eye doctor? No    Dental Screening: Recommended annual dental exams for proper oral hygiene  Community Resource Referral / Chronic Care Management: CRR required this visit?  No   CCM required this visit?  No   Plan:    I have personally reviewed and noted the following in the patient's chart:   Medical and social history Use of alcohol , tobacco or illicit drugs  Current medications and supplements including opioid prescriptions. Patient is not currently taking opioid prescriptions. Functional ability and status Nutritional status Physical activity Advanced directives List of other physicians Hospitalizations, surgeries, and ER visits in  previous 12 months Vitals Screenings to include cognitive, depression, and falls Referrals and appointments  In addition, I have reviewed and discussed with patient certain preventive protocols, quality metrics, and best practice recommendations. A written personalized care plan for preventive services as well as general preventive health recommendations were provided to patient.   Dominyk Law L Kedar Sedano, CMA   08/19/2023   After Visit Summary: (In Person-Declined) Patient declined AVS at this time.  Notes: Nothing significant to report at this time.

## 2023-09-12 DIAGNOSIS — Z974 Presence of external hearing-aid: Secondary | ICD-10-CM | POA: Diagnosis not present

## 2023-09-12 DIAGNOSIS — H6123 Impacted cerumen, bilateral: Secondary | ICD-10-CM | POA: Diagnosis not present

## 2023-09-27 ENCOUNTER — Other Ambulatory Visit: Payer: Self-pay

## 2023-09-27 ENCOUNTER — Ambulatory Visit: Admitting: Physician Assistant

## 2023-09-27 ENCOUNTER — Encounter: Payer: Self-pay | Admitting: Physician Assistant

## 2023-09-27 DIAGNOSIS — M79672 Pain in left foot: Secondary | ICD-10-CM

## 2023-09-27 NOTE — Progress Notes (Unsigned)
 Office Visit Note   Patient: Phillip Shelton           Date of Birth: [birth year]           MRN: [id number] Visit Date: 09/27/2023              Requested by: Norleen Lynwood ORN, MD [address] [place],  [place] [address] PCP: Norleen Lynwood ORN, MD  Chief Complaint  Patient presents with  . Left Foot - Pain      HPI: 89 y/o male with left plantar foot pain only when he walks barefoot.  He has know arthritis in both feet.    Assessment & Plan: Visit Diagnoses:  1. Pain in left foot     Plan: ***  Follow-Up Instructions: Return if symptoms worsen or fail to improve.   Ortho Exam  Patient is alert, oriented, no adenopathy, well-dressed, normal affect, normal respiratory effort. ***    Imaging: No results found. No images are attached to the encounter.  Labs: Lab Results  Component Value Date   HGBA1C 5.9 01/28/2023   HGBA1C 5.6 09/20/2022   HGBA1C 5.6 01/20/2022   ESRSEDRATE 22 (H) 07/17/2018     Lab Results  Component Value Date   ALBUMIN 4.6 07/28/2023   ALBUMIN 3.1 (L) 03/11/2023   ALBUMIN 2.7 (L) 03/11/2023    Lab Results  Component Value Date   MG 1.8 03/11/2023   MG 1.9 03/11/2023   MG 1.9 03/10/2023   Lab Results  Component Value Date   VD25OH 35.99 07/28/2023   VD25OH 37.50 01/20/2022   VD25OH 37.89 07/15/2021    No results found for: PREALBUMIN    Latest Ref Rng & Units 07/28/2023    1:41 PM 03/11/2023    8:53 AM 03/11/2023    7:39 AM  CBC EXTENDED  WBC 4.0 - 10.5 K/uL 5.3  5.4  4.7   RBC 4.22 - 5.81 Mil/uL 4.07  3.97  3.63   Hemoglobin 13.0 - 17.0 g/dL 87.2  87.4  88.5   HCT 39.0 - 52.0 % 37.5  35.9  32.2   Platelets 150.0 - 400.0 K/uL 194.0  222  189   NEUT# 1.4 - 7.7 K/uL 3.0  2.4    Lymph# 0.7 - 4.0 K/uL 1.5  1.4       There is no height or weight on file to calculate BMI.  Orders:  Orders Placed This Encounter  Procedures  . XR Foot Complete Left   No orders of the defined types were placed in this encounter.     Procedures: No procedures performed  Clinical Data: No additional findings.  ROS:  All other systems negative, except as noted in the HPI. Review of Systems  Objective: Vital Signs: There were no vitals taken for this visit.  Specialty Comments:  No specialty comments available.  PMFS History: Patient Active Problem List   Diagnosis Date Noted  . Hypokalemia 07/28/2023  . Syncope and collapse 03/10/2023  . Electrolyte abnormality 03/10/2023  . Abnormal MRI, cervical spine 03/10/2023  . AKI (acute kidney injury) 03/10/2023  . Diarrhea 03/10/2023  . Chronic anticoagulation 02/01/2023  . Encounter for well adult exam with abnormal findings 01/28/2023  . Right inguinal hernia 09/21/2022  . Edema 09/21/2022  . Depression, major, single episode, moderate (HCC) 08/23/2022  . Weakness 08/06/2022  . Chronic atrial fibrillation (HCC) 08/06/2022  . Ambulatory dysfunction 08/06/2022  . Osteoarthritis of right glenohumeral joint 07/13/2022  . Impacted cerumen of right ear  04/13/2022  . Generalized osteoarthritis 12/20/2021  . Insomnia 09/17/2021  . Normocytic anemia 07/21/2021  . Hyponatremia 07/21/2021  . Vitamin D  deficiency 07/20/2021  . Sensorineural hearing loss (SNHL), bilateral 02/10/2021  . Gait disorder 07/19/2020  . Statin myopathy 07/18/2020  . Osteoarthritis of left knee 04/24/2020  . BPH (benign prostatic hyperplasia) 01/18/2020  . History of total hip arthroplasty 12/10/2019  . Synovial cyst of left popliteal space 07/24/2018  . Osteoarthritis of right knee 04/19/2017  . Osteoarthritis of right hip 09/19/2014  . Idiopathic Charcot's joint of foot 12/27/2012  . Pure hypercholesterolemia 12/27/2012  . Hemorrhoids 08/26/2010  . GERD 02/12/2010   Past Medical History:  Diagnosis Date  . Arthritis   . Atrial fibrillation (HCC)   . Blood in stool 02/12/2010   Qualifier: Diagnosis of  By: Lewellyn CMA (AAMA), Alan    . BPH (benign prostatic hyperplasia)  01/18/2020  . Complication of anesthesia   . Diverticulosis   . Esophageal stricture   . GERD 02/12/2010   Qualifier: Diagnosis of  By: Lewellyn CMA (AAMA), Alan    . GERD (gastroesophageal reflux disease)   . Hemorrhoids   . Hiatal hernia   . Idiopathic Charcot's joint of foot 12/27/2012  . Mallory - Weiss tear 1981  . OAB (overactive bladder) 12/27/2012  . Osteoarthritis of right hip 09/19/2014  . PONV (postoperative nausea and vomiting)   . Pure hypercholesterolemia 12/27/2012    Family History  Problem Relation Age of Onset  . Arthritis Father   . Hypertension Father   . Colon cancer Maternal Uncle   . Diabetes Neg Hx   . Heart disease Neg Hx   . Stomach cancer Neg Hx   . Rectal cancer Neg Hx     Past Surgical History:  Procedure Laterality Date  . CHOLECYSTECTOMY    . COLONOSCOPY    . GASTROCNEMIUS RECESSION  02/03/2011   Procedure: GASTROCNEMIUS SLIDE;  Surgeon: Deward DELENA Schwartz, MD;  Location: [facility];  Service: Orthopedics;  Laterality: Right;  . HAND SURGERY  jan 2016   rt hand  . HEMORRHOID SURGERY  1990  . INGUINAL HERNIA REPAIR Right 11/10/2022   Procedure: RIGHT INGUINAL HERNIA REPAIR WITH MESH;  Surgeon: Belinda Cough, MD;  Location: [facility];  Service: General;  Laterality: Right;  LMA & TAP BLOCK  . TOTAL HIP ARTHROPLASTY Right 09/19/2014   Procedure: RIGHT TOTAL HIP ARTHROPLASTY ANTERIOR APPROACH;  Surgeon: Redell Shoals, MD;  Location: [facility];  Service: Orthopedics;  Laterality: Right;  . UMBILICAL HERNIA REPAIR    . UPPER GASTROINTESTINAL ENDOSCOPY     Social History   Occupational History  . Occupation: Retired-worked for [employer]  . Smoking status: Former    Current packs/day: 0.00    Types: Cigarettes    Quit date: 01/29/1980    Years since quitting: 43.6  . Smokeless tobacco: Never  Vaping Use  . Vaping status: Never Used  Substance and Sexual Activity  . Alcohol  use: Not Currently  . Drug use: No  . Sexual  activity: Not on file

## 2023-10-03 ENCOUNTER — Other Ambulatory Visit: Payer: Self-pay | Admitting: Internal Medicine

## 2023-10-03 ENCOUNTER — Telehealth: Payer: Self-pay | Admitting: Internal Medicine

## 2023-10-03 NOTE — Telephone Encounter (Signed)
 Copied from CRM #[id number]. Topic: Clinical - Medication Refill >> Oct 03, 2023  9:52 AM Kendralyn S wrote: Medication: temazepam  (RESTORIL ) 15 MG capsule  Has the patient contacted their pharmacy? Yes (Agent: If no, request that the patient contact the pharmacy for the refill. If patient does not wish to contact the pharmacy document the reason why and proceed with request.) (Agent: If yes, when and what did the pharmacy advise?)  This is the patient's preferred pharmacy:  [facility] - [place] , [place] - [address] [place]  [place] [address] Phone: [phone] Fax: [phone]  Is this the correct pharmacy for this prescription? Yes If no, delete pharmacy and type the correct one.   Has the prescription been filled recently? No  Is the patient out of the medication? Yes  Has the patient been seen for an appointment in the last year OR does the patient have an upcoming appointment? Yes  Can we respond through MyChart? No  Agent: Please be advised that Rx refills may take up to 3 business days. We ask that you follow-up with your pharmacy.

## 2023-10-03 NOTE — Telephone Encounter (Unsigned)
 Copied from CRM #[id number]. Topic: Clinical - Medication Refill >> Oct 03, 2023  9:52 AM Kendralyn S wrote: Medication: temazepam  (RESTORIL ) 15 MG capsule  Has the patient contacted their pharmacy? Yes (Agent: If no, request that the patient contact the pharmacy for the refill. If patient does not wish to contact the pharmacy document the reason why and proceed with request.) (Agent: If yes, when and what did the pharmacy advise?)  This is the patient's preferred pharmacy:  [facility] - [place] , [place] - [address] [place]  [place] [address] Phone: [phone] Fax: [phone]  Is this the correct pharmacy for this prescription? Yes If no, delete pharmacy and type the correct one.   Has the prescription been filled recently? No  Is the patient out of the medication? Yes  Has the patient been seen for an appointment in the last year OR does the patient have an upcoming appointment? Yes  Can we respond through MyChart? No  Agent: Please be advised that Rx refills may take up to 3 business days. We ask that you follow-up with your pharmacy.

## 2023-10-03 NOTE — Telephone Encounter (Signed)
 Pt should have refill of restoril  already in place

## 2023-10-20 DIAGNOSIS — Z974 Presence of external hearing-aid: Secondary | ICD-10-CM | POA: Diagnosis not present

## 2023-10-20 DIAGNOSIS — H903 Sensorineural hearing loss, bilateral: Secondary | ICD-10-CM | POA: Diagnosis not present

## 2023-10-30 ENCOUNTER — Other Ambulatory Visit: Payer: Self-pay | Admitting: Internal Medicine

## 2023-10-31 ENCOUNTER — Other Ambulatory Visit: Payer: Self-pay

## 2023-12-16 DIAGNOSIS — H6123 Impacted cerumen, bilateral: Secondary | ICD-10-CM | POA: Insufficient documentation

## 2023-12-16 DIAGNOSIS — H93293 Other abnormal auditory perceptions, bilateral: Secondary | ICD-10-CM | POA: Insufficient documentation

## 2024-01-04 ENCOUNTER — Telehealth: Payer: Self-pay

## 2024-01-04 ENCOUNTER — Other Ambulatory Visit: Payer: Self-pay | Admitting: Internal Medicine

## 2024-01-04 MED ORDER — TEMAZEPAM 15 MG PO CAPS: 15.0000 mg | ORAL_CAPSULE | Freq: Every evening | ORAL | 0 refills | Status: AC | PRN

## 2024-01-04 NOTE — Telephone Encounter (Signed)
Ok this is done erx 

## 2024-01-31 ENCOUNTER — Ambulatory Visit: Payer: Self-pay | Admitting: Internal Medicine

## 2024-01-31 ENCOUNTER — Ambulatory Visit: Admitting: Internal Medicine

## 2024-01-31 ENCOUNTER — Encounter: Payer: Self-pay | Admitting: Internal Medicine

## 2024-01-31 VITALS — BP 118/76 | HR 88 | Temp 98.4°F | Ht 76.0 in | Wt 189.0 lb

## 2024-01-31 DIAGNOSIS — Z0000 Encounter for general adult medical examination without abnormal findings: Secondary | ICD-10-CM | POA: Diagnosis not present

## 2024-01-31 DIAGNOSIS — R739 Hyperglycemia, unspecified: Secondary | ICD-10-CM

## 2024-01-31 DIAGNOSIS — E611 Iron deficiency: Secondary | ICD-10-CM | POA: Diagnosis not present

## 2024-01-31 DIAGNOSIS — Z0001 Encounter for general adult medical examination with abnormal findings: Secondary | ICD-10-CM

## 2024-01-31 DIAGNOSIS — E559 Vitamin D deficiency, unspecified: Secondary | ICD-10-CM

## 2024-01-31 DIAGNOSIS — Z23 Encounter for immunization: Secondary | ICD-10-CM

## 2024-01-31 DIAGNOSIS — E538 Deficiency of other specified B group vitamins: Secondary | ICD-10-CM

## 2024-01-31 DIAGNOSIS — L570 Actinic keratosis: Secondary | ICD-10-CM | POA: Insufficient documentation

## 2024-01-31 DIAGNOSIS — Z85828 Personal history of other malignant neoplasm of skin: Secondary | ICD-10-CM | POA: Insufficient documentation

## 2024-01-31 DIAGNOSIS — E7800 Pure hypercholesterolemia, unspecified: Secondary | ICD-10-CM | POA: Diagnosis not present

## 2024-01-31 DIAGNOSIS — F4321 Adjustment disorder with depressed mood: Secondary | ICD-10-CM | POA: Insufficient documentation

## 2024-01-31 DIAGNOSIS — L821 Other seborrheic keratosis: Secondary | ICD-10-CM | POA: Insufficient documentation

## 2024-01-31 DIAGNOSIS — I872 Venous insufficiency (chronic) (peripheral): Secondary | ICD-10-CM | POA: Insufficient documentation

## 2024-01-31 LAB — HEPATIC FUNCTION PANEL
ALT: 9 U/L (ref 3–53)
AST: 16 U/L (ref 5–37)
Albumin: 4.5 g/dL (ref 3.5–5.2)
Alkaline Phosphatase: 64 U/L (ref 39–117)
Bilirubin, Direct: 0.2 mg/dL (ref 0.1–0.3)
Total Bilirubin: 0.9 mg/dL (ref 0.2–1.2)
Total Protein: 7 g/dL (ref 6.0–8.3)

## 2024-01-31 LAB — URINALYSIS, ROUTINE W REFLEX MICROSCOPIC
Bilirubin Urine: NEGATIVE
Hgb urine dipstick: NEGATIVE
Ketones, ur: NEGATIVE
Leukocytes,Ua: NEGATIVE
Nitrite: NEGATIVE
RBC / HPF: NONE SEEN
Specific Gravity, Urine: 1.015 (ref 1.000–1.030)
Total Protein, Urine: NEGATIVE
Urine Glucose: NEGATIVE
Urobilinogen, UA: 0.2 (ref 0.0–1.0)
pH: 5.5 (ref 5.0–8.0)

## 2024-01-31 LAB — BASIC METABOLIC PANEL WITH GFR
BUN: 20 mg/dL (ref 6–23)
CO2: 26 meq/L (ref 19–32)
Calcium: 9.4 mg/dL (ref 8.4–10.5)
Chloride: 98 meq/L (ref 96–112)
Creatinine, Ser: 1.12 mg/dL (ref 0.40–1.50)
GFR: 59.24 mL/min — ABNORMAL LOW
Glucose, Bld: 94 mg/dL (ref 70–99)
Potassium: 4.7 meq/L (ref 3.5–5.1)
Sodium: 134 meq/L — ABNORMAL LOW (ref 135–145)

## 2024-01-31 LAB — CBC WITH DIFFERENTIAL/PLATELET
Basophils Absolute: 0 10*3/uL (ref 0.0–0.1)
Basophils Relative: 0.6 % (ref 0.0–3.0)
Eosinophils Absolute: 0.1 10*3/uL (ref 0.0–0.7)
Eosinophils Relative: 0.9 % (ref 0.0–5.0)
HCT: 39 % (ref 39.0–52.0)
Hemoglobin: 13.2 g/dL (ref 13.0–17.0)
Lymphocytes Relative: 23.8 % (ref 12.0–46.0)
Lymphs Abs: 1.4 10*3/uL (ref 0.7–4.0)
MCHC: 33.8 g/dL (ref 30.0–36.0)
MCV: 92.5 fl (ref 78.0–100.0)
Monocytes Absolute: 0.8 10*3/uL (ref 0.1–1.0)
Monocytes Relative: 13 % — ABNORMAL HIGH (ref 3.0–12.0)
Neutro Abs: 3.7 10*3/uL (ref 1.4–7.7)
Neutrophils Relative %: 61.7 % (ref 43.0–77.0)
Platelets: 208 10*3/uL (ref 150.0–400.0)
RBC: 4.21 Mil/uL — ABNORMAL LOW (ref 4.22–5.81)
RDW: 13.7 % (ref 11.5–15.5)
WBC: 6.1 10*3/uL (ref 4.0–10.5)

## 2024-01-31 LAB — LIPID PANEL
Cholesterol: 189 mg/dL (ref 28–200)
HDL: 51.5 mg/dL
LDL Cholesterol: 117 mg/dL — ABNORMAL HIGH (ref 10–99)
NonHDL: 137.71
Total CHOL/HDL Ratio: 4
Triglycerides: 105 mg/dL (ref 10.0–149.0)
VLDL: 21 mg/dL (ref 0.0–40.0)

## 2024-01-31 LAB — FERRITIN: Ferritin: 178 ng/mL (ref 22.0–322.0)

## 2024-01-31 LAB — TSH: TSH: 1.28 u[IU]/mL (ref 0.35–5.50)

## 2024-01-31 LAB — IBC PANEL
Iron: 106 ug/dL (ref 42–165)
Saturation Ratios: 31 % (ref 20.0–50.0)
TIBC: 341.6 ug/dL (ref 250.0–450.0)
Transferrin: 244 mg/dL (ref 212.0–360.0)

## 2024-01-31 LAB — HEMOGLOBIN A1C: Hgb A1c MFr Bld: 5.7 % (ref 4.6–6.5)

## 2024-01-31 LAB — VITAMIN B12: Vitamin B-12: 800 pg/mL (ref 211–911)

## 2024-01-31 LAB — VITAMIN D 25 HYDROXY (VIT D DEFICIENCY, FRACTURES): VITD: 31.68 ng/mL (ref 30.00–100.00)

## 2024-01-31 MED ORDER — APIXABAN 5 MG PO TABS: 5.0000 mg | ORAL_TABLET | Freq: Two times a day (BID) | ORAL | 3 refills | Status: AC

## 2024-01-31 MED ORDER — HYDROCHLOROTHIAZIDE 12.5 MG PO CAPS: 12.5000 mg | ORAL_CAPSULE | Freq: Every day | ORAL | 3 refills | Status: AC

## 2024-01-31 MED ORDER — MIRTAZAPINE 15 MG PO TBDP: 15.0000 mg | ORAL_TABLET | Freq: Every day | ORAL | 3 refills | Status: AC

## 2024-01-31 MED ORDER — FINASTERIDE 5 MG PO TABS: 5.0000 mg | ORAL_TABLET | Freq: Every day | ORAL | 3 refills | Status: AC

## 2024-01-31 NOTE — Progress Notes (Signed)
 Patient ID: Phillip Shelton, male   DOB: [birth year], 89 y.o.   MRN: [id number]         Chief Complaint:: wellness exam and grief, hld, low vit d       HPI:  Phillip Shelton is a 89 y.o. male here for wellness exam, due for flu shot, for shingrix at pharmacy, o/w up to date                        Also wife passed with brain tumor x 3 day, somewhat sad today but Denies worsening depressive symptoms, suicidal ideation, or panic; Pt denies chest pain, increased sob or doe, wheezing, orthopnea, PND, increased LE swelling, palpitations, dizziness or syncope.   Pt denies polydipsia, polyuria, or new focal neuro s/s.   , Pt denies fever, wt loss, night sweats, loss of appetite, or other constitutional symptoms  Walks with walker today, no recent falls but does have worsening balance even at his assisted living.     Wt Readings from Last 3 Encounters:  01/31/24 189 lb (85.7 kg)  08/19/23 188 lb (85.3 kg)  07/28/23 189 lb 6.4 oz (85.9 kg)   BP Readings from Last 3 Encounters:  01/31/24 118/76  08/19/23 100/70  07/28/23 124/68   Immunization History  Administered Date(s) Administered   Fluad Quad(high Dose 65+) 10/04/2018, 10/02/2019, 10/01/2020, 09/15/2021   Fluad Trivalent(High Dose 65+) 09/20/2022   INFLUENZA, HIGH DOSE SEASONAL PF 10/21/2015, 10/11/2016, 10/21/2017, 01/31/2024   Influenza,inj,Quad PF,6+ Mos 10/13/2012, 10/29/2013, 11/20/2014   Influenza,inj,quad, With Preservative 10/05/2018   Influenza-Unspecified 11/04/2020   PFIZER(Purple Top)SARS-COV-2 Vaccination 02/10/2019, 03/07/2019, 10/20/2019   Pfizer Covid-19 Vaccine Bivalent Booster 23yrs & up 10/23/2020   Pneumococcal Conjugate-13 12/26/2013   Pneumococcal Polysaccharide-23 12/03/2004, 04/20/2016   Tdap 03/27/2014   Zoster, Live 07/28/2010   Health Maintenance Due  Topic Date Due   Zoster Vaccines- Shingrix (1 of 2) 12/24/1953      Past Medical History:  Diagnosis Date   Arthritis    Atrial fibrillation (HCC)     Blood in stool 02/12/2010   Qualifier: Diagnosis of  By: Earlean CMA (AAMA), Amanda     BPH (benign prostatic hyperplasia) 01/18/2020   Complication of anesthesia    Diverticulosis    Esophageal stricture    GERD 02/12/2010   Qualifier: Diagnosis of  By: Earlean CMA (AAMA), Amanda     GERD (gastroesophageal reflux disease)    Hemorrhoids    Hiatal hernia    Idiopathic Charcot's joint of foot 12/27/2012   Mallory - Weiss tear 1981   OAB (overactive bladder) 12/27/2012   Osteoarthritis of right hip 09/19/2014   PONV (postoperative nausea and vomiting)    Pure hypercholesterolemia 12/27/2012   Past Surgical History:  Procedure Laterality Date   CHOLECYSTECTOMY     COLONOSCOPY     GASTROCNEMIUS RECESSION  02/03/2011   Procedure: GASTROCNEMIUS SLIDE;  Surgeon: Deward DELENA Schwartz, MD;  Location: [facility];  Service: Orthopedics;  Laterality: Right;   HAND SURGERY  jan 2016   rt hand   HEMORRHOID SURGERY  1990   INGUINAL HERNIA REPAIR Right 11/10/2022   Procedure: RIGHT INGUINAL HERNIA REPAIR WITH MESH;  Surgeon: Belinda Cough, MD;  Location: [facility];  Service: General;  Laterality: Right;  LMA & TAP BLOCK   TOTAL HIP ARTHROPLASTY Right 09/19/2014   Procedure: RIGHT TOTAL HIP ARTHROPLASTY ANTERIOR APPROACH;  Surgeon: Redell Shoals, MD;  Location: [facility];  Service: Orthopedics;  Laterality:  Right;   UMBILICAL HERNIA REPAIR     UPPER GASTROINTESTINAL ENDOSCOPY      reports that he quit smoking about 44 years ago. His smoking use included cigarettes. He has never used smokeless tobacco. He reports that he does not currently use alcohol . He reports that he does not use drugs. family history includes Arthritis in his father; Colon cancer in his maternal uncle; Hypertension in his father. Allergies[1] Medications Ordered Prior to Encounter[2]      ROS:  All others reviewed and negative.  Objective        PE:  BP 118/76 (BP Location: Right Arm, Patient Position: Sitting, Cuff  Size: Normal)   Pulse 88   Temp 98.4 F (36.9 C) (Oral)   Ht 6' 4 (1.93 m)   Wt 189 lb (85.7 kg)   SpO2 98%   BMI 23.01 kg/m                 Constitutional: Pt appears in NAD               HENT: Head: NCAT.                Right Ear: External ear normal.                 Left Ear: External ear normal.                Eyes: . Pupils are equal, round, and reactive to light. Conjunctivae and EOM are normal               Nose: without d/c or deformity               Neck: Neck supple. Gross normal ROM               Cardiovascular: Normal rate and regular rhythm.                 Pulmonary/Chest: Effort normal and breath sounds without rales or wheezing.                Abd:  Soft, NT, ND, + BS, no organomegaly               Neurological: Pt is alert. At baseline orientation, motor grossly intact               Skin: Skin is warm. No rashes, no other new lesions, LE edema - none               Psychiatric: Pt behavior is normal without agitation   Micro: none  Cardiac tracings I have personally interpreted today:  none  Pertinent Radiological findings (summarize): none   Lab Results  Component Value Date   WBC 6.1 01/31/2024   HGB 13.2 01/31/2024   HCT 39.0 01/31/2024   PLT 208.0 01/31/2024   GLUCOSE 94 01/31/2024   CHOL 189 01/31/2024   TRIG 105.0 01/31/2024   HDL 51.50 01/31/2024   LDLDIRECT 133.0 12/22/2012   LDLCALC 117 (H) 01/31/2024   ALT 9 01/31/2024   AST 16 01/31/2024   NA 134 (L) 01/31/2024   K 4.7 01/31/2024   CL 98 01/31/2024   CREATININE 1.12 01/31/2024   BUN 20 01/31/2024   CO2 26 01/31/2024   TSH 1.28 01/31/2024   INR 1.3 (H) 03/10/2023   HGBA1C 5.7 01/31/2024   Assessment/Plan:  Phillip Shelton is a 89 y.o. White or Caucasian [1] male with  has a past medical history of  Arthritis, Atrial fibrillation (HCC), Blood in stool (02/12/2010), BPH (benign prostatic hyperplasia) (01/18/2020), Complication of anesthesia, Diverticulosis, Esophageal stricture, GERD  (02/12/2010), GERD (gastroesophageal reflux disease), Hemorrhoids, Hiatal hernia, Idiopathic Charcot's joint of foot (12/27/2012), Mallory - Weiss tear (1981), OAB (overactive bladder) (12/27/2012), Osteoarthritis of right hip (09/19/2014), PONV (postoperative nausea and vomiting), and Pure hypercholesterolemia (12/27/2012).  Encounter for well adult exam with abnormal findings Age and sex appropriate education and counseling updated with regular exercise and diet Referrals for preventative services - none needed Immunizations addressed - for flu shot today, for shingrix at pharmacy Smoking counseling  - none needed Evidence for depression or other mood disorder - none significant Most recent labs reviewed. I have personally reviewed and have noted: 1) the patient's medical and social history 2) The patient's current medications and supplements 3) The patient's height, weight, and BMI have been recorded in the chart   Pure hypercholesterolemia Lab Results  Component Value Date   LDLCALC 117 (H) 01/31/2024   Mild uncontrolled, pt for lower chol diet, declines statin for now   Vitamin D  deficiency Last vitamin D  Lab Results  Component Value Date   VD25OH 31.68 01/31/2024   Low, to start oral replacement   Grief With mild worsening since wife passed x 3 days, no SI or worsening depression, declines need for counseling referral  Followup: Return in about 6 months (around 07/30/2024).  Lynwood Rush, MD 01/31/2024 8:11 PM [facility]     [1]  Allergies Allergen Reactions   Ambien [Zolpidem] Other (See Comments)    Patient did not like the effects of this, per family   Imiquimod Other (See Comments)   Atorvastatin  Other (See Comments)    Made me feel badly and I could not sleep- Allergic, per facility  [2]  Current Outpatient Medications on File Prior to Visit  Medication Sig Dispense Refill    acetaminophen  (TYLENOL ) 500 MG tablet Take 1,000 mg by mouth 3 (three) times daily.     cyanocobalamin  (VITAMIN B12) 1000 MCG tablet Take 1,000 mcg by mouth daily.     diclofenac  Sodium (GNP DICLOFENAC  SODIUM) 1 % GEL APPLY 4 GRAMS TOPICALLY FOUR TIMES A DAY TO PAINFUL AREAS ON BOTH LEGS 100 g 0   fish oil-omega-3 fatty acids 1000 MG capsule Take 1 capsule by mouth at bedtime.     metoprolol  tartrate (LOPRESSOR ) 25 MG tablet Take 0.5 tablets (12.5 mg total) by mouth 2 (two) times daily. 60 tablet 0   omeprazole  (PRILOSEC) 20 MG capsule Take 1 capsule (20 mg total) by mouth daily. (Patient taking differently: Take 20 mg by mouth daily before breakfast.) 90 capsule 2   ondansetron  (ZOFRAN ) 4 MG tablet Take by mouth.     tamsulosin  (FLOMAX ) 0.4 MG CAPS capsule Take 1 capsule (0.4 mg total) by mouth daily after supper. 30 capsule 0   temazepam  (RESTORIL ) 15 MG capsule Take 1 capsule (15 mg total) by mouth at bedtime as needed. for sleep 90 capsule 0   No current facility-administered medications on file prior to visit.

## 2024-01-31 NOTE — Assessment & Plan Note (Addendum)
Age and sex appropriate education and counseling updated with regular exercise and diet Referrals for preventative services - none needed Immunizations addressed - for flu shot today, for shingrix at pharmacy Smoking counseling  - none needed Evidence for depression or other mood disorder - none significant Most recent labs reviewed. I have personally reviewed and have noted: 1) the patient's medical and social history 2) The patient's current medications and supplements 3) The patient's height, weight, and BMI have been recorded in the chart  

## 2024-01-31 NOTE — Assessment & Plan Note (Signed)
 With mild worsening since wife passed x 3 days, no SI or worsening depression, declines need for counseling referral

## 2024-01-31 NOTE — Patient Instructions (Addendum)
 You had the flu shot today  Please continue all other medications as before, and refills have been done if requested.  Please have the pharmacy call with any other refills you may need.  Please continue your efforts at being more active, low cholesterol diet, and weight control.  You are otherwise up to date with prevention measures today.  Please keep your appointments with your specialists as you may have planned  Please go to the LAB at the blood drawing area for the tests to be done  You will be contacted by phone if any changes need to be made immediately.  Otherwise, you will receive a letter about your results with an explanation, but please check with MyChart first.  Please make an Appointment to return in 6 months, or sooner if needed

## 2024-01-31 NOTE — Assessment & Plan Note (Signed)
 Lab Results  Component Value Date   LDLCALC 117 (H) 01/31/2024   Mild uncontrolled, pt for lower chol diet, declines statin for now

## 2024-01-31 NOTE — Assessment & Plan Note (Signed)
 Last vitamin D  Lab Results  Component Value Date   VD25OH 31.68 01/31/2024   Low, to start oral replacement

## 2024-02-07 ENCOUNTER — Telehealth: Payer: Self-pay

## 2024-02-07 NOTE — Telephone Encounter (Signed)
 Copied from CRM #[id number]. Topic: Referral - Question >> Feb 07, 2024 12:00 PM Fonda T wrote: Reason for CRM: Received call from [facility] with [facility], on behalf of pt.  Calling to request a referral to dermatology due to updated Medicare insurance guidelines. Pt has seen dermatology in past, but now needs referral per insurance.  Pt has an upcoming appt scheduled on 02/15/24, with Dr. Ryan Gammon.  Requesting a follow up call once referral is placed, requesting to referral prior to scheduled appt.   Please contact to advise further, at [phone].

## 2024-02-10 ENCOUNTER — Other Ambulatory Visit: Payer: Self-pay | Admitting: Family

## 2024-02-10 DIAGNOSIS — L821 Other seborrheic keratosis: Secondary | ICD-10-CM

## 2024-07-19 ENCOUNTER — Ambulatory Visit: Admitting: Nurse Practitioner
